# Patient Record
Sex: Male | Born: 1955 | Race: Black or African American | Hispanic: No | Marital: Single | Smoking: Former smoker
Health system: Southern US, Community
[De-identification: ages and names within clinical notes are randomized; demographics above are authoritative.]

## PROBLEM LIST (undated history)

## (undated) DIAGNOSIS — C801 Malignant (primary) neoplasm, unspecified: Secondary | ICD-10-CM

---

## 2020-02-09 ENCOUNTER — Encounter: Payer: Self-pay | Admitting: Emergency Medicine

## 2020-02-09 ENCOUNTER — Other Ambulatory Visit: Payer: Self-pay

## 2020-02-09 ENCOUNTER — Ambulatory Visit
Admission: EM | Admit: 2020-02-09 | Discharge: 2020-02-09 | Disposition: A | Discharge status: Home / Self Care | Payer: Medicaid Other | Attending: Emergency Medicine | Admitting: Emergency Medicine

## 2020-02-09 DIAGNOSIS — R634 Abnormal weight loss: Secondary | ICD-10-CM | POA: Insufficient documentation

## 2020-02-09 DIAGNOSIS — R05 Cough: Secondary | ICD-10-CM | POA: Diagnosis not present

## 2020-02-09 DIAGNOSIS — Z6824 Body mass index (BMI) 24.0-24.9, adult: Secondary | ICD-10-CM | POA: Diagnosis not present

## 2020-02-09 DIAGNOSIS — R1310 Dysphagia, unspecified: Secondary | ICD-10-CM

## 2020-02-09 DIAGNOSIS — F17210 Nicotine dependence, cigarettes, uncomplicated: Secondary | ICD-10-CM | POA: Diagnosis not present

## 2020-02-09 DIAGNOSIS — R1319 Other dysphagia: Secondary | ICD-10-CM | POA: Insufficient documentation

## 2020-02-09 DIAGNOSIS — T719XXA Asphyxiation due to unspecified cause, initial encounter: Secondary | ICD-10-CM | POA: Diagnosis not present

## 2020-02-09 DIAGNOSIS — R0600 Dyspnea, unspecified: Secondary | ICD-10-CM | POA: Insufficient documentation

## 2020-02-09 DIAGNOSIS — Z801 Family history of malignant neoplasm of trachea, bronchus and lung: Secondary | ICD-10-CM | POA: Diagnosis not present

## 2020-02-09 DIAGNOSIS — Z20822 Contact with and (suspected) exposure to covid-19: Secondary | ICD-10-CM | POA: Insufficient documentation

## 2020-02-09 NOTE — Discharge Instructions (Addendum)
I have placed an urgent referral to gastroenterology.  I think that they are going to be your next best step to evaluate and treat you.  You can try a pured diet, smoothies until you are seen by GI.  You can also try contacting them to schedule a close follow-up however somebody should be contacting you in the next day.
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
## 2020-02-10 LAB — SARS CORONAVIRUS 2 (TAT 6-24 HRS): SARS Coronavirus 2: NEGATIVE

## 2020-02-15 ENCOUNTER — Encounter: Payer: Self-pay | Admitting: Gastroenterology

## 2020-02-15 ENCOUNTER — Other Ambulatory Visit: Payer: Self-pay

## 2020-02-15 ENCOUNTER — Ambulatory Visit (INDEPENDENT_AMBULATORY_CARE_PROVIDER_SITE_OTHER): Payer: Self-pay | Admitting: Gastroenterology

## 2020-02-15 VITALS — BP 99/68 | HR 96 | Temp 98.5°F | Ht 61.0 in | Wt 103.5 lb

## 2020-02-15 DIAGNOSIS — R1310 Dysphagia, unspecified: Secondary | ICD-10-CM

## 2020-02-15 DIAGNOSIS — R499 Unspecified voice and resonance disorder: Secondary | ICD-10-CM

## 2020-02-15 DIAGNOSIS — R490 Dysphonia: Secondary | ICD-10-CM

## 2020-02-15 DIAGNOSIS — R634 Abnormal weight loss: Secondary | ICD-10-CM

## 2020-02-15 NOTE — Patient Instructions (Addendum)
[facility] at 8:30am on 02/25/2020 for the Barium Xray. Nothing to eat or drink for 3 hours before the scan
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
## 2020-02-16 LAB — COMPREHENSIVE METABOLIC PANEL
ALT: 10 IU/L (ref 0–44)
AST: 13 IU/L (ref 0–40)
Albumin/Globulin Ratio: 1.3 (ref 1.2–2.2)
Albumin: 3.8 g/dL (ref 3.8–4.8)
Alkaline Phosphatase: 129 IU/L — ABNORMAL HIGH (ref 44–121)
BUN/Creatinine Ratio: 7 — ABNORMAL LOW (ref 10–24)
BUN: 5 mg/dL — ABNORMAL LOW (ref 8–27)
Bilirubin Total: <0.2 mg/dL (ref 0.0–1.2)
CO2: 23 mmol/L (ref 20–29)
Calcium: 9.3 mg/dL (ref 8.6–10.2)
Chloride: 105 mmol/L (ref 96–106)
Creatinine, Ser: 0.69 mg/dL — ABNORMAL LOW (ref 0.76–1.27)
GFR calc Af Amer: 117 mL/min/{1.73_m2} (ref >59)
GFR calc non Af Amer: 101 mL/min/{1.73_m2} (ref >59)
Globulin, Total: 3 g/dL (ref 1.5–4.5)
Glucose: 79 mg/dL (ref 65–99)
Potassium: 4.6 mmol/L (ref 3.5–5.2)
Sodium: 140 mmol/L (ref 134–144)
Total Protein: 6.8 g/dL (ref 6.0–8.5)

## 2020-02-16 LAB — CBC
Hematocrit: 33.5 % — ABNORMAL LOW (ref 37.5–51.0)
Hemoglobin: 10.6 g/dL — ABNORMAL LOW (ref 13.0–17.7)
MCH: 24.6 pg — ABNORMAL LOW (ref 26.6–33.0)
MCHC: 31.6 g/dL (ref 31.5–35.7)
MCV: 78 fL — ABNORMAL LOW (ref 79–97)
Platelets: 611 10*3/uL — ABNORMAL HIGH (ref 150–450)
RBC: 4.31 x10E6/uL (ref 4.14–5.80)
RDW: 16.4 % — ABNORMAL HIGH (ref 11.6–15.4)
WBC: 13.9 10*3/uL — ABNORMAL HIGH (ref 3.4–10.8)

## 2020-02-18 ENCOUNTER — Other Ambulatory Visit: Payer: Self-pay

## 2020-02-18 ENCOUNTER — Ambulatory Visit
Admission: RE | Admit: 2020-02-18 | Discharge: 2020-02-18 | Disposition: A | Discharge status: Home / Self Care | Payer: Medicaid Other | Source: Ambulatory Visit | Attending: Gastroenterology | Admitting: Gastroenterology

## 2020-02-18 ENCOUNTER — Telehealth: Payer: Self-pay

## 2020-02-18 ENCOUNTER — Telehealth: Payer: Self-pay | Admitting: Gastroenterology

## 2020-02-18 DIAGNOSIS — R1310 Dysphagia, unspecified: Secondary | ICD-10-CM | POA: Diagnosis not present

## 2020-02-18 NOTE — Telephone Encounter (Signed)
Called and left a message for call back. What is the diagnosis for EGD

## 2020-02-18 NOTE — Telephone Encounter (Signed)
EGD for dysphagia  RV

## 2020-02-18 NOTE — Telephone Encounter (Signed)
LVM for patient to call our office to schedule an f/u appt per [facility]

## 2020-02-18 NOTE — Telephone Encounter (Signed)
-----   Message from Lin Landsman, MD sent at 02/17/2020  3:51 PM EDT ----- Regarding: Follow-up appointment I just want to make sure this patient has follow-up appointment with me in 3 to 4 weeks, okay to overbookThanks RV

## 2020-02-18 NOTE — Telephone Encounter (Signed)
Called patient brother and he states he will tell the patient to call us back. Could not give him any information because there is no DPR form signed

## 2020-02-18 NOTE — Telephone Encounter (Signed)
Please schedule egd  Thanks  RV

## 2020-02-18 NOTE — Telephone Encounter (Signed)
Patient had his dg esophagus w single cm today. [name] radiology called with the STAT result: An approximately 5 cm long irregular prominent stricture with prominent ulcerations noted in the distal thoracic esophagus. This is very suspicious for esophageal malignancy. Endoscopic evaluation is suggested.

## 2020-02-19 NOTE — Telephone Encounter (Signed)
Left a message for call back with brother.

## 2020-02-22 ENCOUNTER — Other Ambulatory Visit: Payer: Self-pay

## 2020-02-22 DIAGNOSIS — R1310 Dysphagia, unspecified: Secondary | ICD-10-CM

## 2020-02-22 NOTE — Telephone Encounter (Signed)
Paitent called back and scheduled EGD for 03/02/2020. Went over instructions and he verbalized understanding

## 2020-02-22 NOTE — Telephone Encounter (Signed)
Patient made follow up appointment

## 2020-02-29 ENCOUNTER — Other Ambulatory Visit: Payer: Medicaid Other

## 2020-03-01 ENCOUNTER — Other Ambulatory Visit: Payer: Self-pay

## 2020-03-01 ENCOUNTER — Other Ambulatory Visit
Admission: RE | Admit: 2020-03-01 | Discharge: 2020-03-01 | Disposition: A | Discharge status: Home / Self Care | Payer: HRSA Program | Source: Ambulatory Visit | Attending: Gastroenterology | Admitting: Gastroenterology

## 2020-03-01 DIAGNOSIS — Z20822 Contact with and (suspected) exposure to covid-19: Secondary | ICD-10-CM | POA: Diagnosis not present

## 2020-03-01 DIAGNOSIS — Z01812 Encounter for preprocedural laboratory examination: Secondary | ICD-10-CM | POA: Diagnosis present

## 2020-03-01 LAB — SARS CORONAVIRUS 2 (TAT 6-24 HRS): SARS Coronavirus 2: NEGATIVE

## 2020-03-02 ENCOUNTER — Ambulatory Visit
Admission: RE | Admit: 2020-03-02 | Discharge: 2020-03-02 | Disposition: A | Discharge status: Home / Self Care | Payer: Medicaid Other | Attending: Gastroenterology | Admitting: Gastroenterology

## 2020-03-02 ENCOUNTER — Ambulatory Visit: Payer: Medicaid Other | Admitting: Anesthesiology

## 2020-03-02 ENCOUNTER — Encounter
Admission: RE | Disposition: A | Discharge status: Home / Self Care | Payer: Self-pay | Source: Home / Self Care | Attending: Gastroenterology

## 2020-03-02 ENCOUNTER — Telehealth: Payer: Self-pay

## 2020-03-02 ENCOUNTER — Encounter: Payer: Self-pay | Admitting: Gastroenterology

## 2020-03-02 DIAGNOSIS — F17210 Nicotine dependence, cigarettes, uncomplicated: Secondary | ICD-10-CM | POA: Diagnosis not present

## 2020-03-02 DIAGNOSIS — C159 Malignant neoplasm of esophagus, unspecified: Secondary | ICD-10-CM

## 2020-03-02 DIAGNOSIS — R933 Abnormal findings on diagnostic imaging of other parts of digestive tract: Secondary | ICD-10-CM | POA: Insufficient documentation

## 2020-03-02 DIAGNOSIS — C155 Malignant neoplasm of lower third of esophagus: Secondary | ICD-10-CM | POA: Diagnosis not present

## 2020-03-02 DIAGNOSIS — R1310 Dysphagia, unspecified: Secondary | ICD-10-CM | POA: Insufficient documentation

## 2020-03-02 DIAGNOSIS — Z801 Family history of malignant neoplasm of trachea, bronchus and lung: Secondary | ICD-10-CM | POA: Insufficient documentation

## 2020-03-02 DIAGNOSIS — R1319 Other dysphagia: Secondary | ICD-10-CM

## 2020-03-02 HISTORY — PX: ESOPHAGOGASTRODUODENOSCOPY (EGD) WITH PROPOFOL: SHX5813

## 2020-03-02 SURGERY — ESOPHAGOGASTRODUODENOSCOPY (EGD) WITH PROPOFOL
Anesthesia: General

## 2020-03-02 MED ORDER — PROPOFOL 500 MG/50ML IV EMUL
INTRAVENOUS | Status: AC
  Filled 2020-03-02: qty 50

## 2020-03-02 MED ORDER — LIDOCAINE HCL (PF) 2 % IJ SOLN
INTRAMUSCULAR | Status: AC
  Filled 2020-03-02: qty 5

## 2020-03-02 MED ORDER — PHENYLEPHRINE HCL (PRESSORS) 10 MG/ML IV SOLN
INTRAVENOUS | Status: DC | PRN
  Administered 2020-03-02: 100 ug via INTRAVENOUS

## 2020-03-02 MED ORDER — PROPOFOL 500 MG/50ML IV EMUL
INTRAVENOUS | Status: DC | PRN
  Administered 2020-03-02: 150 ug/kg/min via INTRAVENOUS

## 2020-03-02 MED ORDER — LIDOCAINE HCL (CARDIAC) PF 100 MG/5ML IV SOSY
PREFILLED_SYRINGE | INTRAVENOUS | Status: DC | PRN
  Administered 2020-03-02: 100 mg via INTRATRACHEAL

## 2020-03-02 MED ORDER — PROPOFOL 10 MG/ML IV BOLUS
INTRAVENOUS | Status: DC | PRN
  Administered 2020-03-02 (×3): 20 mg via INTRAVENOUS
  Administered 2020-03-02: 40 mg via INTRAVENOUS

## 2020-03-02 MED ORDER — SODIUM CHLORIDE 0.9 % IV SOLN: INTRAVENOUS | Status: DC

## 2020-03-02 NOTE — Anesthesia Procedure Notes (Signed)
Date/Time: 03/02/2020 10:20 AM Performed by: Allean Found, CRNA Pre-anesthesia Checklist: Patient identified, Emergency Drugs available, Suction available, Patient being monitored and Timeout performed Patient Re-evaluated:Patient Re-evaluated prior to induction Oxygen Delivery Method: Nasal cannula and Circle system utilized Preoxygenation: Pre-oxygenation with 100% oxygen Placement Confirmation: positive ETCO2 Comments: Desaturated to 70's with nasal cannula O2, Applied Facemask and 100% O2

## 2020-03-02 NOTE — H&P (Signed)
Cephas Darby, MD [address]  [place]  [place], [place]  [address]  Main: [phone]  Fax: [phone] Pager: [phone]  Primary Care Physician:  Patient, No Pcp Per Primary Gastroenterologist:  Dr. Cephas Darby  Pre-Procedure History & Physical: HPI:  Andrew Pratt is a 64 y.o. male is here for an endoscopy.   History reviewed. No pertinent past medical history.  History reviewed. No pertinent surgical history.  Prior to Admission medications   Not on File    Allergies as of 02/22/2020  . (No Known Allergies)    Family History  Problem Relation Age of Onset  . Cancer Mother        lung    Social History   Socioeconomic History  . Marital status: Single    Spouse name: Not on file  . Number of children: Not on file  . Years of education: Not on file  . Highest education level: Not on file  Occupational History  . Not on file  Tobacco Use  . Smoking status: Current Every Day Smoker    Packs/day: 0.50    Types: Cigarettes  . Smokeless tobacco: Never Used  Vaping Use  . Vaping Use: Never used  Substance and Sexual Activity  . Alcohol use: Yes    Alcohol/week: 7.0 standard drinks    Types: 7 Cans of beer per week  . Drug use: Not Currently  . Sexual activity: Not on file  Other Topics Concern  . Not on file  Social History Narrative  . Not on file   Social Determinants of Health   Financial Resource Strain:   . Difficulty of Paying Living Expenses: Not on file  Food Insecurity:   . Worried About Charity fundraiser in the Last Year: Not on file  . Ran Out of Food in the Last Year: Not on file  Transportation Needs:   . Lack of Transportation (Medical): Not on file  . Lack of Transportation (Non-Medical): Not on file  Physical Activity:   . Days of Exercise per Week: Not on file  . Minutes of Exercise per Session: Not on file  Stress:   . Feeling of Stress : Not on file  Social Connections:   . Frequency of Communication with  Friends and Family: Not on file  . Frequency of Social Gatherings with Friends and Family: Not on file  . Attends Religious Services: Not on file  . Active Member of Clubs or Organizations: Not on file  . Attends Archivist Meetings: Not on file  . Marital Status: Not on file  Intimate Partner Violence:   . Fear of Current or Ex-Partner: Not on file  . Emotionally Abused: Not on file  . Physically Abused: Not on file  . Sexually Abused: Not on file    Review of Systems: See HPI, otherwise negative ROS  Physical Exam: BP 126/73   Pulse 75   Temp 97.7 F (36.5 C) (Temporal)   Resp 17   Ht 5\' 1"  (1.549 m)   Wt 44.9 kg   SpO2 100%   BMI 18.71 kg/m  General:   Alert,  pleasant and cooperative in NAD Head:  Normocephalic and atraumatic. Neck:  Supple; no masses or thyromegaly. Lungs:  Clear throughout to auscultation.    Heart:  Regular rate and rhythm. Abdomen:  Soft, nontender and nondistended. Normal bowel sounds, without guarding, and without rebound.   Neurologic:  Alert and  oriented x4;  grossly normal neurologically.  Impression/Plan: Andrew Pratt is here for an endoscopy to be performed for dysphagia  Risks, benefits, limitations, and alternatives regarding  endoscopy have been reviewed with the patient.  Questions have been answered.  All parties agreeable.   Sherri Sear, MD  03/02/2020, 8:46 AM

## 2020-03-02 NOTE — Anesthesia Postprocedure Evaluation (Signed)
Anesthesia Post Note  Patient: Andrew Pratt  Procedure(s) Performed: ESOPHAGOGASTRODUODENOSCOPY (EGD) WITH PROPOFOL (N/A )  Patient location during evaluation: Endoscopy Anesthesia Type: General Level of consciousness: awake and alert and oriented Pain management: pain level controlled Vital Signs Assessment: post-procedure vital signs reviewed and stable Respiratory status: spontaneous breathing, nonlabored ventilation and respiratory function stable Cardiovascular status: blood pressure returned to baseline and stable Postop Assessment: no signs of nausea or vomiting Anesthetic complications: no   No complications documented.   Last Vitals:  Vitals:   03/02/20 1120 03/02/20 1130  BP: (!) 141/87 137/86  Pulse: 61 60  Resp: (!) 30 (!) 24  Temp:    SpO2: 97% 97%    Last Pain:  Vitals:   03/02/20 0831  TempSrc: Temporal                 Zubair Lofton

## 2020-03-02 NOTE — Transfer of Care (Signed)
Immediate Anesthesia Transfer of Care Note  Patient: Andrew Pratt  Procedure(s) Performed: ESOPHAGOGASTRODUODENOSCOPY (EGD) WITH PROPOFOL (N/A )  Patient Location: PACU  Anesthesia Type:General  Level of Consciousness: awake, alert  and oriented  Airway & Oxygen Therapy: Patient Spontanous Breathing and Patient connected to nasal cannula oxygen  Post-op Assessment: Report given to RN and Post -op Vital signs reviewed and stable  Post vital signs: Reviewed and stable  Last Vitals:  Vitals Value Taken Time  BP 130/83 03/02/20 1057  Temp 36.6 C 03/02/20 1057  Pulse 69 03/02/20 1058  Resp 23 03/02/20 1059  SpO2 99 % 03/02/20 1058  Vitals shown include unvalidated device data.  Last Pain:  Vitals:   03/02/20 0831  TempSrc: Temporal         Complications: No complications documented.

## 2020-03-02 NOTE — Anesthesia Preprocedure Evaluation (Signed)
Anesthesia Evaluation  Patient identified by MRN, date of birth, ID band Patient awake    Reviewed: Allergy & Precautions, NPO status , Patient's Chart, lab work & pertinent test results  History of Anesthesia Complications Negative for: history of anesthetic complications  Airway Mallampati: II  TM Distance: >3 FB Neck ROM: Full    Dental  (+) Poor Dentition   Pulmonary neg sleep apnea, neg COPD, Current Smoker and Patient abstained from smoking.,    breath sounds clear to auscultation- rhonchi (-) wheezing      Cardiovascular (-) hypertension(-) CAD, (-) Past MI, (-) Cardiac Stents and (-) CABG  Rhythm:Regular Rate:Normal - Systolic murmurs and - Diastolic murmurs    Neuro/Psych neg Seizures negative neurological ROS  negative psych ROS   GI/Hepatic negative GI ROS, Neg liver ROS,   Endo/Other  negative endocrine ROSneg diabetes  Renal/GU negative Renal ROS     Musculoskeletal negative musculoskeletal ROS (+)   Abdominal (+) - obese,   Peds  Hematology negative hematology ROS (+)   Anesthesia Other Findings   Reproductive/Obstetrics                             Anesthesia Physical Anesthesia Plan  ASA: II  Anesthesia Plan: General   Post-op Pain Management:    Induction: Intravenous  PONV Risk Score and Plan: 0 and Propofol infusion  Airway Management Planned: Natural Airway  Additional Equipment:   Intra-op Plan:   Post-operative Plan:   Informed Consent: I have reviewed the patients History and Physical, chart, labs and discussed the procedure including the risks, benefits and alternatives for the proposed anesthesia with the patient or authorized representative who has indicated his/her understanding and acceptance.     Dental advisory given  Plan Discussed with: CRNA and Anesthesiologist  Anesthesia Plan Comments:         Anesthesia Quick Evaluation

## 2020-03-02 NOTE — Op Note (Signed)
[facility] Gastroenterology Patient Name: Andrew Pratt Procedure Date: 03/02/2020 10:13 AM MRN: [id number] Account #: [id number] Date of Birth: [birth year] Admit Type: Outpatient Age: 64 Room: [facility] ENDO ROOM 2 Gender: Male Note Status: Finalized Procedure:             Upper GI endoscopy Indications:           Esophageal dysphagia, Suspected tumor of the                         esophagus, Abnormal UGI series Providers:             Lin Landsman MD, MD Referring MD:          No Local Md, MD (Referring MD) Medicines:             Monitored Anesthesia Care Complications:         No immediate complications. Estimated blood loss:                         Minimal. Procedure:             Pre-Anesthesia Assessment:                        - Prior to the procedure, a History and Physical was                         performed, and patient medications and allergies were                         reviewed. The patient is competent. The risks and                         benefits of the procedure and the sedation options and                         risks were discussed with the patient. All questions                         were answered and informed consent was obtained.                         Patient identification and proposed procedure were                         verified by the physician, the nurse, the                         anesthesiologist, the anesthetist and the technician                         in the pre-procedure area in the procedure room in the                         endoscopy suite. Mental Status Examination: alert and                         oriented. Airway Examination: normal oropharyngeal  airway and neck mobility. Respiratory Examination:                         clear to auscultation. CV Examination: normal.                         Prophylactic Antibiotics: The patient does not require                         prophylactic  antibiotics. Prior Anticoagulants: The                         patient has taken no previous anticoagulant or                         antiplatelet agents. ASA Grade Assessment: III - A                         patient with severe systemic disease. After reviewing                         the risks and benefits, the patient was deemed in                         satisfactory condition to undergo the procedure. The                         anesthesia plan was to use monitored anesthesia care                         (MAC). Immediately prior to administration of                         medications, the patient was re-assessed for adequacy                         to receive sedatives. The heart rate, respiratory                         rate, oxygen saturations, blood pressure, adequacy of                         pulmonary ventilation, and response to care were                         monitored throughout the procedure. The physical                         status of the patient was re-assessed after the                         procedure.                        After obtaining informed consent, the endoscope was                         passed under direct vision. Throughout the procedure,  the patient's blood pressure, pulse, and oxygen                         saturations were monitored continuously. The Endoscope                         was introduced through the mouth, and advanced to the                         lower third of esophagus. The upper GI endoscopy was                         unusually difficult due to a partially obstructing                         mass. The patient tolerated the procedure well. Findings:      A large, fungating and ulcerating mass with bleeding and stigmata of       recent bleeding was found in the distal esophagus, 30 cm from the       incisors extending to 26cm. The mass was partially obstructing and       circumferential. Biopsies were taken  with a cold forceps for histology.       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 Biopsied. Recommendation:        - Await pathology results.                        - Refer to an oncologist at appointment to be                         scheduled.                        - Full liquid diet, mechanical soft diet and pureed                         diet. 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 All rights reserved. The codes documented in this report are preliminary and upon coder review may  be revised to meet current compliance requirements. Dr. Ulyess Mort Lin Landsman MD, MD 03/02/2020 10:38:28 AM This report has been signed electronically. Number of Addenda: 0 Note Initiated On: 03/02/2020 10:13 AM Estimated Blood Loss:  Estimated blood loss was minimal.      [facility]

## 2020-03-02 NOTE — Telephone Encounter (Signed)
-----   Message from Lin Landsman, MD sent at 03/02/2020 11:00 AM EDT ----- Regarding: Referral to oncology Refer him to Dr Janese Banks or Dr Tasia Catchings Dx: Esophageal cancer  RV

## 2020-03-02 NOTE — Telephone Encounter (Signed)
Sent referral to oncology with dr. Janese Banks

## 2020-03-03 ENCOUNTER — Encounter: Payer: Self-pay | Admitting: Gastroenterology

## 2020-03-03 LAB — SURGICAL PATHOLOGY

## 2020-03-04 ENCOUNTER — Other Ambulatory Visit: Payer: Self-pay | Admitting: *Deleted

## 2020-03-04 DIAGNOSIS — C155 Malignant neoplasm of lower third of esophagus: Secondary | ICD-10-CM

## 2020-03-05 ENCOUNTER — Inpatient Hospital Stay
Admission: EM | Admit: 2020-03-05 | Discharge: 2020-03-12 | DRG: 356 | Disposition: A | Discharge status: Home / Self Care | Payer: Medicaid Other | Attending: Internal Medicine | Admitting: Internal Medicine

## 2020-03-05 ENCOUNTER — Other Ambulatory Visit: Payer: Self-pay

## 2020-03-05 ENCOUNTER — Encounter: Payer: Self-pay | Admitting: Emergency Medicine

## 2020-03-05 DIAGNOSIS — D649 Anemia, unspecified: Secondary | ICD-10-CM

## 2020-03-05 DIAGNOSIS — Z20822 Contact with and (suspected) exposure to covid-19: Secondary | ICD-10-CM | POA: Diagnosis present

## 2020-03-05 DIAGNOSIS — E876 Hypokalemia: Secondary | ICD-10-CM | POA: Diagnosis present

## 2020-03-05 DIAGNOSIS — J690 Pneumonitis due to inhalation of food and vomit: Secondary | ICD-10-CM

## 2020-03-05 DIAGNOSIS — C155 Malignant neoplasm of lower third of esophagus: Secondary | ICD-10-CM | POA: Diagnosis present

## 2020-03-05 DIAGNOSIS — Z7189 Other specified counseling: Secondary | ICD-10-CM

## 2020-03-05 DIAGNOSIS — R1310 Dysphagia, unspecified: Secondary | ICD-10-CM

## 2020-03-05 DIAGNOSIS — Z720 Tobacco use: Secondary | ICD-10-CM | POA: Diagnosis present

## 2020-03-05 DIAGNOSIS — K222 Esophageal obstruction: Principal | ICD-10-CM

## 2020-03-05 DIAGNOSIS — E43 Unspecified severe protein-calorie malnutrition: Secondary | ICD-10-CM | POA: Insufficient documentation

## 2020-03-05 DIAGNOSIS — F17210 Nicotine dependence, cigarettes, uncomplicated: Secondary | ICD-10-CM | POA: Diagnosis present

## 2020-03-05 DIAGNOSIS — Z23 Encounter for immunization: Secondary | ICD-10-CM

## 2020-03-05 DIAGNOSIS — K2289 Other specified disease of esophagus: Secondary | ICD-10-CM

## 2020-03-05 DIAGNOSIS — Z681 Body mass index (BMI) 19 or less, adult: Secondary | ICD-10-CM

## 2020-03-05 DIAGNOSIS — Z801 Family history of malignant neoplasm of trachea, bronchus and lung: Secondary | ICD-10-CM

## 2020-03-05 DIAGNOSIS — D72829 Elevated white blood cell count, unspecified: Secondary | ICD-10-CM | POA: Diagnosis present

## 2020-03-05 DIAGNOSIS — C159 Malignant neoplasm of esophagus, unspecified: Secondary | ICD-10-CM

## 2020-03-05 DIAGNOSIS — Z95828 Presence of other vascular implants and grafts: Secondary | ICD-10-CM

## 2020-03-05 DIAGNOSIS — R531 Weakness: Secondary | ICD-10-CM

## 2020-03-05 DIAGNOSIS — C770 Secondary and unspecified malignant neoplasm of lymph nodes of head, face and neck: Secondary | ICD-10-CM | POA: Diagnosis present

## 2020-03-05 HISTORY — DX: Malignant (primary) neoplasm, unspecified: C80.1

## 2020-03-05 LAB — CBC WITH DIFFERENTIAL/PLATELET
Abs Immature Granulocytes: 0.12 10*3/uL — ABNORMAL HIGH (ref 0.00–0.07)
Basophils Absolute: 0 10*3/uL (ref 0.0–0.1)
Basophils Relative: 0 %
Eosinophils Absolute: 0.1 10*3/uL (ref 0.0–0.5)
Eosinophils Relative: 1 %
HCT: 37.3 % — ABNORMAL LOW (ref 39.0–52.0)
Hemoglobin: 11.3 g/dL — ABNORMAL LOW (ref 13.0–17.0)
Immature Granulocytes: 1 %
Lymphocytes Relative: 17 %
Lymphs Abs: 2 10*3/uL (ref 0.7–4.0)
MCH: 24.6 pg — ABNORMAL LOW (ref 26.0–34.0)
MCHC: 30.3 g/dL (ref 30.0–36.0)
MCV: 81.3 fL (ref 80.0–100.0)
Monocytes Absolute: 1.2 10*3/uL — ABNORMAL HIGH (ref 0.1–1.0)
Monocytes Relative: 10 %
Neutro Abs: 8.4 10*3/uL — ABNORMAL HIGH (ref 1.7–7.7)
Neutrophils Relative %: 71 %
Platelets: 568 10*3/uL — ABNORMAL HIGH (ref 150–400)
RBC: 4.59 MIL/uL (ref 4.22–5.81)
RDW: 18.8 % — ABNORMAL HIGH (ref 11.5–15.5)
WBC: 11.8 10*3/uL — ABNORMAL HIGH (ref 4.0–10.5)
nRBC: 0 % (ref 0.0–0.2)

## 2020-03-05 LAB — COMPREHENSIVE METABOLIC PANEL
ALT: 9 U/L (ref 0–44)
AST: 14 U/L — ABNORMAL LOW (ref 15–41)
Albumin: 3.6 g/dL (ref 3.5–5.0)
Alkaline Phosphatase: 94 U/L (ref 38–126)
Anion gap: 10 (ref 5–15)
BUN: 10 mg/dL (ref 8–23)
CO2: 25 mmol/L (ref 22–32)
Calcium: 9.6 mg/dL (ref 8.9–10.3)
Chloride: 108 mmol/L (ref 98–111)
Creatinine, Ser: 0.74 mg/dL (ref 0.61–1.24)
GFR, Estimated: >60 mL/min (ref >60)
Glucose, Bld: 99 mg/dL (ref 70–99)
Potassium: 3.3 mmol/L — ABNORMAL LOW (ref 3.5–5.1)
Sodium: 143 mmol/L (ref 135–145)
Total Bilirubin: 0.7 mg/dL (ref 0.3–1.2)
Total Protein: 8.3 g/dL — ABNORMAL HIGH (ref 6.5–8.1)

## 2020-03-05 LAB — RESPIRATORY PANEL BY RT PCR (FLU A&B, COVID)
Influenza A by PCR: NEGATIVE
Influenza B by PCR: NEGATIVE
SARS Coronavirus 2 by RT PCR: NEGATIVE

## 2020-03-05 MED ORDER — NICOTINE 21 MG/24HR TD PT24
21.0000 mg | MEDICATED_PATCH | Freq: Every day | TRANSDERMAL | Status: DC
  Administered 2020-03-06 – 2020-03-12 (×7): 21 mg via TRANSDERMAL
  Filled 2020-03-05 (×7): qty 1

## 2020-03-05 MED ORDER — SODIUM CHLORIDE 0.9 % IV BOLUS
1000.0000 mL | Freq: Once | INTRAVENOUS | Status: AC
  Administered 2020-03-05: 1000 mL via INTRAVENOUS

## 2020-03-05 MED ORDER — MORPHINE SULFATE (PF) 2 MG/ML IV SOLN
1.0000 mg | INTRAVENOUS | Status: DC | PRN
  Administered 2020-03-07: 1 mg via INTRAVENOUS
  Filled 2020-03-05: qty 1

## 2020-03-05 MED ORDER — DEXTROSE-NACL 5-0.9 % IV SOLN: INTRAVENOUS | Status: DC

## 2020-03-05 MED ORDER — SODIUM CHLORIDE 0.9 % IV SOLN: INTRAVENOUS | Status: DC

## 2020-03-05 MED ORDER — SODIUM CHLORIDE 0.9 % IV BOLUS: 1000.0000 mL | Freq: Once | INTRAVENOUS | Status: DC

## 2020-03-05 MED ORDER — POTASSIUM CHLORIDE 10 MEQ/100ML IV SOLN
10.0000 meq | INTRAVENOUS | Status: AC
  Administered 2020-03-05 (×2): 10 meq via INTRAVENOUS
  Filled 2020-03-05 (×2): qty 100

## 2020-03-05 MED ORDER — ONDANSETRON HCL 4 MG/2ML IJ SOLN
4.0000 mg | Freq: Three times a day (TID) | INTRAMUSCULAR | Status: DC | PRN
  Administered 2020-03-05 – 2020-03-11 (×2): 4 mg via INTRAVENOUS
  Filled 2020-03-05 (×2): qty 2

## 2020-03-05 NOTE — ED Triage Notes (Signed)
Pt to ED via POV c/o sore throat. Pt states that he has cancer in his throat. He is supposed to start chemo on Monday. Pt states that he is unable to swallow food due to the pain. Pt had upper GI on Monday, states that he has not been able to keep anything down since then. Pt states that if he tries to drink water it comes right back up. Pt is in NAD.

## 2020-03-05 NOTE — H&P (Signed)
History and Physical    Andrew Pratt:[id number] DOB: [birth year] DOA: 03/05/2020  Referring MD/NP/PA:   PCP: Patient, No Pcp Per   Patient coming from:  The patient is coming from home.  At baseline, pt is independent for most of ADL.        Chief Complaint: Difficulty swallowing  HPI: Andrew Pratt is a 64 y.o. male with medical history significant of tobacco abuse, recently diagnosed esophageal cancer, who presents with difficulty swallowing.  Patient states that he has been having difficulty swallowing for almost 1/2-year. Pt had EGD by Dr. Marius Ditch on 10/6. EGD showed partially obstructing and malignant esophageal tumor in the distal esophagus. 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  Patient was already given referral to oncologist.  Pt state that after the procedure, he has not been able to swallow or eat food.  He has difficulty swallowing and sore throat. He has been spitting up everything he eats and drinks since that time. No nausea, vomiting, diarrhea or abdominal pain.  No chest pain or shortness breath.  Patient has mild dry cough.  No fever or chills.  No symptoms of UTI or unilateral weakness.   ED Course: pt was found to have WBC 11.8, pending COVID-19 PCR, potassium 3.3, renal function okay, temperature normal, blood pressure 106/89, heart rate 96, RR 18, oxygen saturation 99% on room air.  Patient is placed on MedSurg bed for observation.  Dr. [name] of GI is consulted.  Review of Systems:   General: no fevers, chills, has poor appetite, has fatigue HEENT: no blurry vision, hearing changes or sore throat Respiratory: no dyspnea, coughing, wheezing CV: no chest pain, no palpitations GI: no nausea, vomiting, abdominal pain, diarrhea, constipation.  Has difficulty swallowing GU: no dysuria, burning on urination, increased urinary frequency, hematuria  Ext: no leg edema Neuro: no unilateral weakness, numbness, or tingling, no vision change or hearing  loss Skin: no rash, no skin tear. MSK: No muscle spasm, no deformity, no limitation of range of movement in spin Heme: No easy bruising.  Travel history: No recent long distant travel.  Allergy: No Known Allergies  Past Medical History:  Diagnosis Date  . Cancer [facility])     Past Surgical History:  Procedure Laterality Date  . ESOPHAGOGASTRODUODENOSCOPY (EGD) WITH PROPOFOL N/A 03/02/2020   Procedure: ESOPHAGOGASTRODUODENOSCOPY (EGD) WITH PROPOFOL;  Surgeon: Lin Landsman, MD;  Location: [place];  Service: Gastroenterology;  Laterality: N/A;    Social History:  reports that he has been smoking cigarettes. He has been smoking about 0.50 packs per day. He has never used smokeless tobacco. He reports current alcohol use of about 7.0 standard drinks of alcohol per week. He reports previous drug use.  Family History:  Family History  Problem Relation Age of Onset  . Cancer Mother        lung     Prior to Admission medications   Not on File    Physical Exam: Vitals:   03/05/20 0904 03/05/20 0905  BP: 106/89   Pulse: 96   Resp: 18   Temp: 98.8 F (37.1 C)   SpO2: 99%   Weight:  44.9 kg  Height:  5\' 1"  (1.549 m)   General: Not in acute distress HEENT:       Eyes: PERRL, EOMI, no scleral icterus.       ENT: No discharge from the ears and nose, no pharynx injection, no tonsillar enlargement.        Neck: No JVD,  no bruit, no mass felt. Heme: No neck lymph node enlargement. Cardiac: S1/S2, RRR, No murmurs, No gallops or rubs. Respiratory: No rales, wheezing, rhonchi or rubs. GI: Soft, nondistended, nontender, no rebound pain, no organomegaly, BS present. GU: No hematuria Ext: No pitting leg edema bilaterally. 2+DP/PT pulse bilaterally. Musculoskeletal: No joint deformities, No joint redness or warmth, no limitation of ROM in spin. Skin: No rashes.  Neuro: Alert, oriented X3, cranial nerves II-XII grossly intact, moves all extremities normally. Psych: Patient is  not psychotic, no suicidal or hemocidal ideation.  Labs on Admission: I have personally reviewed following labs and imaging studies  CBC: Recent Labs  Lab 03/05/20 0907  WBC 11.8*  NEUTROABS 8.4*  HGB 11.3*  HCT 37.3*  MCV 81.3  PLT 025*   Basic Metabolic Panel: Recent Labs  Lab 03/05/20 0907  NA 143  K 3.3*  CL 108  CO2 25  GLUCOSE 99  BUN 10  CREATININE 0.74  CALCIUM 9.6   GFR: Estimated Creatinine Clearance: 60 mL/min (by C-G formula based on SCr of 0.74 mg/dL). Liver Function Tests: Recent Labs  Lab 03/05/20 0907  AST 14*  ALT 9  ALKPHOS 94  BILITOT 0.7  PROT 8.3*  ALBUMIN 3.6   No results for input(s): LIPASE, AMYLASE in the last 168 hours. No results for input(s): AMMONIA in the last 168 hours. Coagulation Profile: No results for input(s): INR, PROTIME in the last 168 hours. Cardiac Enzymes: No results for input(s): CKTOTAL, CKMB, CKMBINDEX, TROPONINI in the last 168 hours. BNP (last 3 results) No results for input(s): PROBNP in the last 8760 hours. HbA1C: No results for input(s): HGBA1C in the last 72 hours. CBG: No results for input(s): GLUCAP in the last 168 hours. Lipid Profile: No results for input(s): CHOL, HDL, LDLCALC, TRIG, CHOLHDL, LDLDIRECT in the last 72 hours. Thyroid Function Tests: No results for input(s): TSH, T4TOTAL, FREET4, T3FREE, THYROIDAB in the last 72 hours. Anemia Panel: No results for input(s): VITAMINB12, FOLATE, FERRITIN, TIBC, IRON, RETICCTPCT in the last 72 hours. Urine analysis: No results found for: COLORURINE, APPEARANCEUR, LABSPEC, PHURINE, GLUCOSEU, HGBUR, BILIRUBINUR, KETONESUR, PROTEINUR, UROBILINOGEN, NITRITE, LEUKOCYTESUR Sepsis Labs: @LABRCNTIP (procalcitonin:4,lacticidven:4) ) Recent Results (from the past 240 hour(s))  SARS CORONAVIRUS 2 (TAT 6-24 HRS) Nasopharyngeal Nasopharyngeal Swab     Status: None   Collection Time: 03/01/20 11:37 AM   Specimen: Nasopharyngeal Swab  Result Value Ref Range Status     SARS Coronavirus 2 NEGATIVE NEGATIVE Final    Comment: (NOTE) SARS-CoV-2 target nucleic acids are NOT DETECTED.  The SARS-CoV-2 RNA is generally detectable in upper and lower respiratory specimens during the acute phase of infection. Negative results do not preclude SARS-CoV-2 infection, do not rule out co-infections with other pathogens, and should not be used as the sole basis for treatment or other patient management decisions. Negative results must be combined with clinical observations, patient history, and epidemiological information. The expected result is Negative.  Fact Sheet for Patients: SugarRoll.be  Fact Sheet for Healthcare Providers: https://www.woods-mathews.com/  This test is not yet approved or cleared by the [place] FDA and  has been authorized for detection and/or diagnosis of SARS-CoV-2 by FDA under an Emergency Use Authorization (EUA). This EUA will remain  in effect (meaning this test can be used) for the duration of the COVID-19 declaration under Se ction 564(b)(1) of the Act, 21 U.S.C. section 360bbb-3(b)(1), unless the authorization is terminated or revoked sooner.  Performed at [facility], [place] [address] [place], [place]  [address]   Respiratory Panel by RT PCR (Flu A&B, Covid) - Nasopharyngeal Swab     Status: None   Collection Time: 03/05/20 12:55 PM   Specimen: Nasopharyngeal Swab  Result Value Ref Range Status   SARS Coronavirus 2 by RT PCR NEGATIVE NEGATIVE Final    Comment: (NOTE) SARS-CoV-2 target nucleic acids are NOT DETECTED.  The SARS-CoV-2 RNA is generally detectable in upper respiratoy specimens during the acute phase of infection. The lowest concentration of SARS-CoV-2 viral copies this assay can detect is 131 copies/mL. A negative result does not preclude SARS-Cov-2 infection and should not be used as the sole basis for treatment or other patient management decisions. A  negative result may occur with  improper specimen collection/handling, submission of specimen other than nasopharyngeal swab, presence of viral mutation(s) within the areas targeted by this assay, and inadequate number of viral copies (<131 copies/mL). A negative result must be combined with clinical observations, patient history, and epidemiological information. The expected result is Negative.  Fact Sheet for Patients:  PinkCheek.be  Fact Sheet for Healthcare Providers:  GravelBags.it  This test is no t yet approved or cleared by the [place] FDA and  has been authorized for detection and/or diagnosis of SARS-CoV-2 by FDA under an Emergency Use Authorization (EUA). This EUA will remain  in effect (meaning this test can be used) for the duration of the COVID-19 declaration under Section 564(b)(1) of the Act, 21 U.S.C. section 360bbb-3(b)(1), unless the authorization is terminated or revoked sooner.     Influenza A by PCR NEGATIVE NEGATIVE Final   Influenza B by PCR NEGATIVE NEGATIVE Final    Comment: (NOTE) The Xpert Xpress SARS-CoV-2/FLU/RSV assay is intended as an aid in  the diagnosis of influenza from Nasopharyngeal swab specimens and  should not be used as a sole basis for treatment. Nasal washings and  aspirates are unacceptable for Xpert Xpress SARS-CoV-2/FLU/RSV  testing.  Fact Sheet for Patients: PinkCheek.be  Fact Sheet for Healthcare Providers: GravelBags.it  This test is not yet approved or cleared by the [place] FDA and  has been authorized for detection and/or diagnosis of SARS-CoV-2 by  FDA under an Emergency Use Authorization (EUA). This EUA will remain  in effect (meaning this test can be used) for the duration of the  Covid-19 declaration under Section 564(b)(1) of the Act, 21  U.S.C. section 360bbb-3(b)(1), unless the authorization  is  terminated or revoked. Performed at [facility], [address] [place], [place] [address]      Radiological Exams on Admission: No results found.   EKG:  Not done in ED, will get one.   Assessment/Plan Principal Problem:   Difficulty swallowing Active Problems:   Malignant neoplasm of lower third of esophagus (HCC)   Hypokalemia   Leukocytosis   Tobacco abuse   Difficulty swallowing due to malignant neoplasm of lower third of esophagus [facility]): Dr. [name] of GI is consulted.  Planning to repeat EGD tomorrow morning -will place on med-surg bed for obs -Keep patient n.p.o. -As needed Zofran -IV fluid: Normal saline 1 L bolus, then D5-NS at 75 cc/h -Patient was already given referral to oncology previously, need to follow-up with oncology  Hypokalemia: Potassium 3.3 -Replete potassium by IV -Check magnesium level  Leukocytosis: WBC 11.8, no fever, no source of infection identified, likely reactive -Follow-up with CBC  Tobacco abuse -Nicotine patch      DVT ppx: SQ Heparin     Code Status: Full code Family Communication: not  done, no family member is at bed side.   Disposition Plan:  Anticipate discharge back to previous environment Consults called:  Dr. [name] Admission status: Med-surg bed for obs  Status is: Observation  The patient remains OBS appropriate and will d/c before 2 midnights.  Dispo: The patient is from: Home              Anticipated d/c is to: Home              Anticipated d/c date is: 1 day              Patient currently is not medically stable to d/c.           Date of Service 03/05/2020    Ivor Costa [facility]   If 7PM-7AM, please contact night-coverage [email or web address] 03/05/2020, 2:11 PM

## 2020-03-05 NOTE — ED Provider Notes (Signed)
[facility] Emergency Department Provider Note   ____________________________________________   First MD Initiated Contact with Patient 03/05/20 1245     (approximate)  I have reviewed the triage vital signs and the nursing notes.   HISTORY  Chief Complaint Sore Throat    HPI Andrew Pratt is a 64 y.o. male with concerns for an esophageal mass  Reports difficulty with swallowing, this led to him having an endoscopy on the  sixth of this month  He was found to have a mass obstructing his esophagus.  He reports after the procedure he went home he has not been able to swallow or eat anything since then, whenever he eats or swallows anything it just comes back up.  His esophagus seems to be blocked  No recent illnesses otherwise.  No fevers or chills.  No chest pain no nausea vomiting.  He reports it will hurt just a little bit after he swallows then he has to spit it back up.  Has been spitting up everything he eats and drinks since that time  Past Medical History:  Diagnosis Date  . Cancer [facility])     Patient Active Problem List   Diagnosis Date Noted  . Malignant neoplasm of lower third of esophagus ([facility])   . Esophageal dysphagia     Past Surgical History:  Procedure Laterality Date  . ESOPHAGOGASTRODUODENOSCOPY (EGD) WITH PROPOFOL N/A 03/02/2020   Procedure: ESOPHAGOGASTRODUODENOSCOPY (EGD) WITH PROPOFOL;  Surgeon: Lin Landsman, MD;  Location: [place];  Service: Gastroenterology;  Laterality: N/A;    Prior to Admission medications   Not on File    Allergies Patient has no known allergies.  Family History  Problem Relation Age of Onset  . Cancer Mother        lung    Social History Social History   Tobacco Use  . Smoking status: Current Every Day Smoker    Packs/day: 0.50    Types: Cigarettes  . Smokeless tobacco: Never Used  Vaping Use  . Vaping Use: Never used  Substance Use Topics  . Alcohol use: Yes     Alcohol/week: 7.0 standard drinks    Types: 7 Cans of beer per week  . Drug use: Not Currently    Review of Systems Constitutional: No fever/chills Eyes: No visual changes. ENT: Little sore dryness in the throat, cannot seem to keep anything down Cardiovascular: Denies chest pain. Respiratory: Denies shortness of breath. Gastrointestinal: No abdominal pain.   Genitourinary: Negative for dysuria. Musculoskeletal: Negative for back pain. Neurological: Negative for headaches.    ____________________________________________   PHYSICAL EXAM:  VITAL SIGNS: ED Triage Vitals  Enc Vitals Group     BP 03/05/20 0904 106/89     Pulse Rate 03/05/20 0904 96     Resp 03/05/20 0904 18     Temp 03/05/20 0904 98.8 F (37.1 C)     Temp src --      SpO2 03/05/20 0904 99 %     Weight 03/05/20 0905 99 lb (44.9 kg)     Height 03/05/20 0905 5\' 1"  (1.549 m)     Head Circumference --      Peak Flow --      Pain Score 03/05/20 0904 10     Pain Loc --      Pain Edu? --      Excl. in [place]? --     Constitutional: Alert and oriented. Well appearing and in no acute distress. Eyes: Conjunctivae are normal.  Head: Atraumatic. Nose: No congestion/rhinnorhea. Mouth/Throat: Mucous membranes are moist.  Occasionally spitting up saliva into basin. Neck: No stridor.  Cardiovascular: Normal rate, regular rhythm. Good peripheral circulation. Respiratory: Normal respiratory effort.  No retractions.  Gastrointestinal: Soft and nontender. No distention. Musculoskeletal: No lower extremity tenderness nor edema. Neurologic:  Normal speech and language. No gross focal neurologic deficits are appreciated.  Skin:  Skin is warm, dry and intact. No rash noted. Psychiatric: Mood and affect are normal. Speech and behavior are normal.  ____________________________________________   LABS (all labs ordered are listed, but only abnormal results are displayed)  Labs Reviewed  CBC WITH DIFFERENTIAL/PLATELET -  Abnormal; Notable for the following components:      Result Value   WBC 11.8 (*)    Hemoglobin 11.3 (*)    HCT 37.3 (*)    MCH 24.6 (*)    RDW 18.8 (*)    Platelets 568 (*)    Neutro Abs 8.4 (*)    Monocytes Absolute 1.2 (*)    Abs Immature Granulocytes 0.12 (*)    All other components within normal limits  COMPREHENSIVE METABOLIC PANEL - Abnormal; Notable for the following components:   Potassium 3.3 (*)    Total Protein 8.3 (*)    AST 14 (*)    All other components within normal limits  RESPIRATORY PANEL BY RT PCR (FLU A&B, COVID)   ____________________________________________  EKG   ____________________________________________  RADIOLOGY   ____________________________________________   PROCEDURES  Procedure(s) performed: None  Procedures  Critical Care performed: No  ____________________________________________   INITIAL IMPRESSION / ASSESSMENT AND PLAN / ED COURSE  Pertinent labs & imaging results that were available during my care of the patient were reviewed by me and considered in my medical decision making (see chart for details).   Patient appears to have ongoing esophageal obstructive type symptoms.  He is spitting up.  No airway involvement.  Speaking clearly without any concerns for aspiration.  He is alert and oriented, but unable to keep anything down by mouth.  Patient spoke with Dr. [name] of GI while I was with him, and treatment plan developed to keep him n.p.o., IV fluids for hydration, and Dr. [name] will plan to do endoscopy for the patient tomorrow morning.  Patient is alert and oriented, his lab work reassuring with regards to his renal function with mild leukocytosis that is actually improved from previous and his hemoglobin also appears to be stable, I suspect he may be even just slightly hemoconcentrated  Discussed with the patient, patient agreeable with plan for admission.  Understanding that likely will have an endoscopy with Dr. [name] tomorrow  morning  Andrew Pratt was evaluated in Emergency Department on 03/05/2020 for the symptoms described in the history of present illness. He was evaluated in the context of the global COVID-19 pandemic, which necessitated consideration that the patient might be at risk for infection with the SARS-CoV-2 virus that causes COVID-19. Institutional protocols and algorithms that pertain to the evaluation of patients at risk for COVID-19 are in a state of rapid change based on information released by regulatory bodies including the CDC and federal and state organizations. These policies and algorithms were followed during the patient's care in the ED.     Admission discussed with Dr. Blaine Hamper.  Patient understand agreeable with plan for admission  ____________________________________________   FINAL CLINICAL IMPRESSION(S) / ED DIAGNOSES  Final diagnoses:  Esophageal obstruction        Note:  This document was  prepared using Systems analyst and may include unintentional dictation errors       Delman Kitten, MD 03/05/20 1412

## 2020-03-05 NOTE — ED Notes (Signed)
Pt presents to ED with c/o of having difficulty swallowing and increasing sore throat. Pt states recent EGD due to throat cancer. Pt states since EGD he has not been able to hold any liquid or food down. Pt presents with emesis bag full of pepsi. Pt states he has tried water also but cannot hold it down. Pt does have hoarse voice but denies SOB at this time. Pt is ambulatory and A&Ox4 at this time. No current distress noted.

## 2020-03-06 ENCOUNTER — Encounter: Payer: Self-pay | Admitting: Internal Medicine

## 2020-03-06 ENCOUNTER — Observation Stay: Payer: Medicaid Other

## 2020-03-06 ENCOUNTER — Observation Stay: Payer: Self-pay

## 2020-03-06 DIAGNOSIS — E876 Hypokalemia: Secondary | ICD-10-CM | POA: Diagnosis present

## 2020-03-06 DIAGNOSIS — C155 Malignant neoplasm of lower third of esophagus: Secondary | ICD-10-CM | POA: Diagnosis present

## 2020-03-06 DIAGNOSIS — Z23 Encounter for immunization: Secondary | ICD-10-CM | POA: Diagnosis not present

## 2020-03-06 DIAGNOSIS — D649 Anemia, unspecified: Secondary | ICD-10-CM | POA: Diagnosis present

## 2020-03-06 DIAGNOSIS — R1310 Dysphagia, unspecified: Secondary | ICD-10-CM | POA: Diagnosis present

## 2020-03-06 DIAGNOSIS — J690 Pneumonitis due to inhalation of food and vomit: Secondary | ICD-10-CM | POA: Diagnosis present

## 2020-03-06 DIAGNOSIS — K222 Esophageal obstruction: Secondary | ICD-10-CM | POA: Diagnosis present

## 2020-03-06 DIAGNOSIS — F17210 Nicotine dependence, cigarettes, uncomplicated: Secondary | ICD-10-CM | POA: Diagnosis present

## 2020-03-06 DIAGNOSIS — Z20822 Contact with and (suspected) exposure to covid-19: Secondary | ICD-10-CM | POA: Diagnosis present

## 2020-03-06 DIAGNOSIS — Z681 Body mass index (BMI) 19 or less, adult: Secondary | ICD-10-CM | POA: Diagnosis not present

## 2020-03-06 DIAGNOSIS — Z801 Family history of malignant neoplasm of trachea, bronchus and lung: Secondary | ICD-10-CM | POA: Diagnosis not present

## 2020-03-06 DIAGNOSIS — C770 Secondary and unspecified malignant neoplasm of lymph nodes of head, face and neck: Secondary | ICD-10-CM | POA: Diagnosis present

## 2020-03-06 DIAGNOSIS — E43 Unspecified severe protein-calorie malnutrition: Secondary | ICD-10-CM | POA: Diagnosis present

## 2020-03-06 LAB — PROTIME-INR
INR: 1.1 (ref 0.8–1.2)
Prothrombin Time: 14.2 seconds (ref 11.4–15.2)

## 2020-03-06 LAB — BASIC METABOLIC PANEL
Anion gap: 10 (ref 5–15)
BUN: 7 mg/dL — ABNORMAL LOW (ref 8–23)
CO2: 24 mmol/L (ref 22–32)
Calcium: 9.3 mg/dL (ref 8.9–10.3)
Chloride: 110 mmol/L (ref 98–111)
Creatinine, Ser: 0.76 mg/dL (ref 0.61–1.24)
GFR, Estimated: >60 mL/min (ref >60)
Glucose, Bld: 105 mg/dL — ABNORMAL HIGH (ref 70–99)
Potassium: 3.3 mmol/L — ABNORMAL LOW (ref 3.5–5.1)
Sodium: 144 mmol/L (ref 135–145)

## 2020-03-06 LAB — CBC
HCT: 32.5 % — ABNORMAL LOW (ref 39.0–52.0)
Hemoglobin: 10 g/dL — ABNORMAL LOW (ref 13.0–17.0)
MCH: 24.8 pg — ABNORMAL LOW (ref 26.0–34.0)
MCHC: 30.8 g/dL (ref 30.0–36.0)
MCV: 80.6 fL (ref 80.0–100.0)
Platelets: 472 10*3/uL — ABNORMAL HIGH (ref 150–400)
RBC: 4.03 MIL/uL — ABNORMAL LOW (ref 4.22–5.81)
RDW: 18.7 % — ABNORMAL HIGH (ref 11.5–15.5)
WBC: 18 10*3/uL — ABNORMAL HIGH (ref 4.0–10.5)
nRBC: 0 % (ref 0.0–0.2)

## 2020-03-06 LAB — APTT: aPTT: 35 seconds (ref 24–36)

## 2020-03-06 LAB — MAGNESIUM: Magnesium: 2.1 mg/dL (ref 1.7–2.4)

## 2020-03-06 MED ORDER — HEPARIN SODIUM (PORCINE) 5000 UNIT/ML IJ SOLN
5000.0000 [IU] | Freq: Three times a day (TID) | INTRAMUSCULAR | Status: DC
  Administered 2020-03-09: 5000 [IU] via SUBCUTANEOUS
  Filled 2020-03-06 (×5): qty 1

## 2020-03-06 MED ORDER — IOHEXOL 300 MG/ML  SOLN
80.0000 mL | Freq: Once | INTRAMUSCULAR | Status: AC | PRN
  Administered 2020-03-06: 80 mL via INTRAVENOUS

## 2020-03-06 MED ORDER — SODIUM CHLORIDE 0.9% FLUSH
10.0000 mL | Freq: Two times a day (BID) | INTRAVENOUS | Status: DC
  Administered 2020-03-06: 30 mL
  Administered 2020-03-07: 20 mL
  Administered 2020-03-07: 10 mL
  Administered 2020-03-08: 20 mL
  Administered 2020-03-09 – 2020-03-11 (×5): 10 mL
  Administered 2020-03-11: 30 mL
  Administered 2020-03-12: 10 mL

## 2020-03-06 MED ORDER — IOHEXOL 300 MG/ML  SOLN: 100.0000 mL | Freq: Once | INTRAMUSCULAR | Status: DC | PRN

## 2020-03-06 MED ORDER — ACETAMINOPHEN 325 MG PO TABS: 650.0000 mg | ORAL_TABLET | Freq: Four times a day (QID) | ORAL | Status: DC | PRN

## 2020-03-06 MED ORDER — ACETAMINOPHEN 650 MG RE SUPP
650.0000 mg | Freq: Four times a day (QID) | RECTAL | Status: DC | PRN
  Filled 2020-03-06: qty 1

## 2020-03-06 MED ORDER — SODIUM CHLORIDE 0.9% FLUSH: 10.0000 mL | INTRAVENOUS | Status: DC | PRN

## 2020-03-06 MED ORDER — CHLORHEXIDINE GLUCONATE CLOTH 2 % EX PADS
6.0000 | MEDICATED_PAD | Freq: Every day | CUTANEOUS | Status: DC
  Administered 2020-03-07 – 2020-03-12 (×5): 6 via TOPICAL

## 2020-03-06 MED ORDER — IOHEXOL 9 MG/ML PO SOLN: 500.0000 mL | ORAL | Status: AC

## 2020-03-06 MED ORDER — ACETAMINOPHEN 650 MG RE SUPP: 650.0000 mg | Freq: Four times a day (QID) | RECTAL | Status: DC | PRN

## 2020-03-06 NOTE — Progress Notes (Signed)
Peripherally Inserted Central Catheter Placement  The IV Nurse has discussed with the patient and/or persons authorized to consent for the patient, the purpose of this procedure and the potential benefits and risks involved with this procedure.  The benefits include less needle sticks, lab draws from the catheter, and the patient may be discharged home with the catheter. Risks include, but not limited to, infection, bleeding, blood clot (thrombus formation), and puncture of an artery; nerve damage and irregular heartbeat and possibility to perform a PICC exchange if needed/ordered by physician.  Alternatives to this procedure were also discussed.  Bard Power PICC patient education guide, fact sheet on infection prevention and patient information card has been provided to patient /or left at bedside.    PICC Placement Documentation  PICC Triple Lumen 03/06/20 PICC Right Brachial 34 cm 0 cm (Active)  Indication for Insertion or Continuance of Line Administration of hyperosmolar/irritating solutions (i.e. TPN, Vancomycin, etc.) 03/06/20 1444  Exposed Catheter (cm) 0 cm 03/06/20 1444  Site Assessment Clean;Dry;Intact 03/06/20 1444  Lumen #1 Status Flushed;Saline locked;Blood return noted 03/06/20 1444  Lumen #2 Status Flushed;Saline locked;Blood return noted 03/06/20 1444  Lumen #3 Status Saline locked;Flushed;Blood return noted 03/06/20 1444  Dressing Type Transparent;Securing device;Other (Comment) 03/06/20 1444  Dressing Status Clean;Dry;Intact 03/06/20 1444  Antimicrobial disc in place? Yes 03/06/20 1444  Safety Lock Not Applicable 39/76/73 4193  Line Care Connections checked and tightened 03/06/20 1444  Line Adjustment (NICU/IV Team Only) No 03/06/20 1444  Dressing Intervention New dressing 03/06/20 7902  Dressing Change Due 03/13/20 03/06/20 1444       Rolena Infante 03/06/2020, 2:56 PM

## 2020-03-06 NOTE — Progress Notes (Addendum)
Initial Nutrition Assessment  DOCUMENTATION CODES:   Not applicable (suspect PCM)  INTERVENTION:   If TPN still desired and patient obtains access:  Pharmacy to initiate Clinimix 5%AA/15%Dextrose at 45ml/hr with MVI and Trace Minerals at this time. Adjustments to be made by weekday RD.   Once J-tube placed:  -Osmolite 1.2 @ 20 ml/hr -Increase by 10 ml Q4 hours to goal rate of 55 ml/hr (1320 ml)  Provides: 1584 kcals, 73 grams protein, 1082 ml free water.   Monitor magnesium, potassium, and phosphorus daily for at least 3 days, MD to replete as needed, as pt is at risk for refeeding syndrome.   NUTRITION DIAGNOSIS:   Inadequate oral intake related to dysphagia as evidenced by per patient/family report.  GOAL:   Patient will meet greater than or equal to 90% of their needs  MONITOR:   PO intake, Supplement acceptance, Weight trends, Labs, I & O's, Diet advancement, TF tolerance  REASON FOR ASSESSMENT:   Consult New TPN/TNA  ASSESSMENT:   Patient with PMH significant for esophageal cancer. Presents this admission with difficulty swallowing.   RD contact per pharmacy via on call pager regarding possible initiation of TPN. Pt currently does not have access. Plan for pt o have J-tube placed in near future. If J-tube able to be placed and enteral nutrition able to start soon, may be able to hold off on TPN.   PTA pt has had difficulty swallowing for 6 months. He recently underwent EGD on 10/6 and has not been able to eat much since.   Records indicate pt weighed 46.9 kg on 9/20 and 44.9 kg this admission (4.2% wt loss in one month, significant for time frame). Attempted to reach pt but no answer. Suspect severe malnutrition in setting of chronic illness but unable to diagnose without NFPE.   Drips: D5 in NS @ 75 ml/hr  Labs: K 3.3 (L)   Diet Order:   Diet Order            Diet NPO time specified Except for: Ice Chips, Other (See Comments)  Diet effective now                  EDUCATION NEEDS:   Not appropriate for education at this time  Skin:  Skin Assessment: Reviewed RN Assessment  Last BM:  10/2  Height:   Ht Readings from Last 1 Encounters:  03/05/20 5\' 1"  (1.549 m)    Weight:   Wt Readings from Last 1 Encounters:  03/05/20 44.9 kg    BMI:  Body mass index is 18.71 kg/m.  Estimated Nutritional Needs:   Kcal:  1400-1600 kcal  Protein:  70-90 g  Fluid:  >/= 1.4 L/day   Mariana Single RD, LDN Clinical Nutrition Pager listed in [place]

## 2020-03-06 NOTE — Consult Note (Signed)
Andrew Pratt , MD [address], [place], [place], [place], [address] [address], [place], [place], [place], [address] Phone: [phone]  Fax: [phone]  Consultation  Referring Provider:   ER Primary Care Physician:  Patient, No Pcp Per Primary Gastroenterologist:  Dr. Marius Ditch          Reason for Consultation:     Dysphagia  Date of Admission:  03/05/2020 Date of Consultation:  03/06/2020         HPI:   Andrew Pratt is a 64 y.o. male Presented to the emergency room yesterday with difficulty swallowing.  He recently underwent upper endoscopy on 03/02/2020 by Dr. Marius Ditch for dysphagia and a large fungating ulcerated mass with bleeding was found in the distal esophagus.  The upper endoscope was not able to go past the mass due to obstruction.  Biopsies were taken.  I reviewed the images on epic and there is barely any lumen visible.  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  He is due to see Dr. Janese Banks as an outpatient in oncology.  Since the procedure he is unable to eat or swallow any food  No recent imaging to stage the cancer.  Hemoglobin 11.3 g He states that presently he cannot swallow anything.  He just returned from his CT scan of the chest abdomen pelvis and reports elevated.  Previously results of the endoscopy has not been discussed with him.  He has been told that it may be cancer.  Past Medical History:  Diagnosis Date  . Cancer [facility])     Past Surgical History:  Procedure Laterality Date  . ESOPHAGOGASTRODUODENOSCOPY (EGD) WITH PROPOFOL N/A 03/02/2020   Procedure: ESOPHAGOGASTRODUODENOSCOPY (EGD) WITH PROPOFOL;  Surgeon: Lin Landsman, MD;  Location: [facility];  Service: Gastroenterology;  Laterality: N/A;    Prior to Admission medications   Not on File    Family History  Problem Relation Age of Onset  . Cancer Mother        lung     Social History   Tobacco Use  . Smoking status: Current Every Day Smoker    Packs/day:  0.50    Types: Cigarettes  . Smokeless tobacco: Never Used  Vaping Use  . Vaping Use: Never used  Substance Use Topics  . Alcohol use: Yes    Alcohol/week: 7.0 standard drinks    Types: 7 Cans of beer per week  . Drug use: Not Currently    Allergies as of 03/05/2020  . (No Known Allergies)    Review of Systems:    All systems reviewed and negative except where noted in HPI.   Physical Exam:  Vital signs in last 24 hours: Temp:  [98 F (36.7 C)-98.8 F (37.1 C)] 98.7 F (37.1 C) (10/10 0422) Pulse Rate:  [57-96] 57 (10/10 0422) Resp:  [16-25] 16 (10/10 0422) BP: (106-141)/(71-89) 127/72 (10/10 0422) SpO2:  [98 %-100 %] 98 % (10/10 0422) Weight:  [44.9 kg] 44.9 kg (10/09 0905) Last BM Date: 02/27/20 General:   Pleasant, cooperative in NAD Head:  Normocephalic and atraumatic. Eyes:   No icterus.   Conjunctiva pink. PERRLA. Ears:  Normal auditory acuity. Neck:  Supple; no masses or thyroidomegaly Lungs: Respirations even and unlabored. Lungs clear to auscultation bilaterally.   No wheezes, crackles, or rhonchi.  Heart:  Regular rate and rhythm;  Without murmur, clicks, rubs or gallops Abdomen:  Soft, nondistended, nontender. Normal bowel sounds. No appreciable masses or hepatomegaly.  No rebound or guarding.  Neurologic:  Alert and oriented x3;  grossly normal neurologically. Skin:  Intact without significant lesions or rashes. Cervical Nodes:  No significant cervical adenopathy. Psych:  Alert and cooperative. Normal affect.  LAB RESULTS: Recent Labs    03/05/20 0907  WBC 11.8*  HGB 11.3*  HCT 37.3*  PLT 568*   BMET Recent Labs    03/05/20 0907  NA 143  K 3.3*  CL 108  CO2 25  GLUCOSE 99  BUN 10  CREATININE 0.74  CALCIUM 9.6   LFT Recent Labs    03/05/20 0907  PROT 8.3*  ALBUMIN 3.6  AST 14*  ALT 9  ALKPHOS 94  BILITOT 0.7   PT/INR No results for input(s): LABPROT, INR in the last 72 hours.  STUDIES: No results found.    Impression /  Plan:   Andrew Pratt is a 64 y.o. 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  The mass was in the distal part of the esophagus and almost leading to complete obstruction.  The upper endoscope during the procedure could not go past the obstruction at the lumen was very narrow.  Since the procedure he is unable to eat or drink anything.  I did discuss with Dr. Marius Ditch and she does agree that the mass was large enough that it is not surprising that he is unable to eat anything at this point of time.  I have discussed with Dr. Janese Banks agree with the recommendation that we should get a staging CT scan of the chest and abdomen.  If the CT scan shows metastatic disease then the goal of treatment would be palliative and hence a PEG tube would be appropriate for nutrition.  If the CT scan do not show any metastatic disease then he probably would be a surgical candidate and hence would need a J-tube at that point of time followed by esophagectomy.  If a J-tube is required can start him on TPN till we get the J-tube.  No role for endoscopy as it would not relieve any obstruction due to cancer.   Based on the CT scan results he is getting today can decide about TPN.  If no metastatic disease is seen please start him on TPN this evening.  Dr. Janese Banks from oncology is going to see him at 8 AM, the patient's family member said that she will be here at 8 AM to discuss with oncology as well along with the patient further course of action  I will sign off.  Please call me if any further GI concerns or questions.  We would like to thank you for the opportunity to participate in the care of Andrew Pratt.   Thank you for involving me in the care of this patient.      LOS: 0 days   Andrew Bellows, MD  03/06/2020, 8:52 AM

## 2020-03-06 NOTE — Progress Notes (Signed)
PROGRESS NOTE    Andrew Pratt   PJA:[id number]  DOB: 1955/06/23  PCP: Patient, No Pcp Per    DOA: 03/05/2020 LOS: 0   Brief Narrative   Andrew Pratt is a 64 y.o. male with medical history of tobacco abuse, recently diagnosed esophageal cancer, who presented to the ED on 03/05/20 with difficulty swallowing. This had been an ongoing issue for 1/2 year.  Pt underwent EGD on 10/6 with Dr. Marius Ditch which showed a partially obstructing mass in the distal esophagus.  Biopsy showed invasive squamous cell carcinoma, referred to oncology.  Since EGD, pt reports regurgitation of any PO intake including liquids.  In the ED, afebrile, HR 96, BP 106/89.  Labs showed mild leukocytosis 11.8k, Hbg 11.3 (up from 10.6 in Sept), hypokalemia K 3.3.    Admitted to hospitalist service with GI consulted.  Plan is for repeat EGD.     Assessment & Plan   Principal Problem:   Difficulty swallowing Active Problems:   Malignant neoplasm of lower third of esophagus (HCC)   Hypokalemia   Leukocytosis   Tobacco abuse   Dysphagia with esophageal obstruction due to squamous cell carcinoma - recent diagnosis of esophageal cancer after EGD on 10/6 as above.  GI (Dr. [name]) and Oncology (Dr. Janese Banks) are consulted.  Had not yet been seen by oncology after referral just a few days ago. --no utility in repeating EGD --PICC line and TPN ordered --Staging CT chest/abdomen/pelvis today --NPO ex ice chips / swish and spit liquids --Zofran PRN --IV hydration with D5-NS --Depending on staging results, will determine if surgical candidate and G vs J tube for enteral feeds  Hypokalemia - Potassium 3.3 on admission, replaced by IV.   --monitor BMP, replace as needed  Leukocytosis - WBC 11.8k on admission, with no fever, and no source of infection identified, likely reactive. --Monitor CBC and clinically for signs/symptoms of infection  Tobacco abuse - Nicotine patch   DVT prophylaxis: SCD's for now    Diet:   Diet Orders (From admission, onward)    Start     Ordered   03/05/20 1312  Diet NPO time specified  Diet effective now        03/05/20 1311            Code Status: Not on file    Subjective 03/06/20    Pt seen at bedside.  Reports unable to even swallow water, regurgitates his own saliva.  Has pain with talking very much.  No abdominal or chest pain.     Disposition Plan & Communication   Status is: Inpatient  Remains inpatient appropriate because:Inpatient level of care appropriate due to severity of illness.  No tolerance for oral intake, ongoing diagnostic evaluation.   Dispo: The patient is from: Home              Anticipated d/c is to: Home              Anticipated d/c date is: 2 days              Patient currently is not medically stable to d/c.   Family Communication: none at bedside, will attempt to call or see at bedside this afternoon    Consults, Procedures, Significant Events   Consultants:   Gastroenterology  Oncology  Procedures:   None  Antimicrobials:  Anti-infectives (From admission, onward)   None        Objective   Vitals:   03/05/20 1857 03/05/20 1953 03/05/20  2343 03/06/20 0422  BP: (!) 141/73 136/75 124/71 127/72  Pulse: 64 67 83 (!) 57  Resp: 20 17 20 16   Temp: 98 F (36.7 C) 98.7 F (37.1 C) 98.3 F (36.8 C) 98.7 F (37.1 C)  TempSrc: Oral Oral Oral Oral  SpO2: 100% 100% 99% 98%  Weight:      Height:        Intake/Output Summary (Last 24 hours) at 03/06/2020 0751 Last data filed at 03/06/2020 0400 Gross per 24 hour  Intake 0 ml  Output 450 ml  Net -450 ml   Filed Weights   03/05/20 0905  Weight: 44.9 kg    Physical Exam:  General exam: awake, alert, no acute distress, underweight Respiratory system: CTAB, no wheezes, rales or rhonchi, normal respiratory effort. Cardiovascular system: normal S1/S2, RRR, no pedal edema.   Gastrointestinal system: soft, NT, ND, +bowel sounds. Central nervous system: A&O x3.  no gross focal neurologic deficits, normal speech Psychiatry: normal mood, congruent affect, judgement and insight appear normal  Labs   Data Reviewed: I have personally reviewed following labs and imaging studies  CBC: Recent Labs  Lab 03/05/20 0907  WBC 11.8*  NEUTROABS 8.4*  HGB 11.3*  HCT 37.3*  MCV 81.3  PLT 027*   Basic Metabolic Panel: Recent Labs  Lab 03/05/20 0907 03/06/20 0521  NA 143  --   K 3.3*  --   CL 108  --   CO2 25  --   GLUCOSE 99  --   BUN 10  --   CREATININE 0.74  --   CALCIUM 9.6  --   MG  --  2.1   GFR: Estimated Creatinine Clearance: 60 mL/min (by C-G formula based on SCr of 0.74 mg/dL). Liver Function Tests: Recent Labs  Lab 03/05/20 0907  AST 14*  ALT 9  ALKPHOS 94  BILITOT 0.7  PROT 8.3*  ALBUMIN 3.6   No results for input(s): LIPASE, AMYLASE in the last 168 hours. No results for input(s): AMMONIA in the last 168 hours. Coagulation Profile: No results for input(s): INR, PROTIME in the last 168 hours. Cardiac Enzymes: No results for input(s): CKTOTAL, CKMB, CKMBINDEX, TROPONINI in the last 168 hours. BNP (last 3 results) No results for input(s): PROBNP in the last 8760 hours. HbA1C: No results for input(s): HGBA1C in the last 72 hours. CBG: No results for input(s): GLUCAP in the last 168 hours. Lipid Profile: No results for input(s): CHOL, HDL, LDLCALC, TRIG, CHOLHDL, LDLDIRECT in the last 72 hours. Thyroid Function Tests: No results for input(s): TSH, T4TOTAL, FREET4, T3FREE, THYROIDAB in the last 72 hours. Anemia Panel: No results for input(s): VITAMINB12, FOLATE, FERRITIN, TIBC, IRON, RETICCTPCT in the last 72 hours. Sepsis Labs: No results for input(s): PROCALCITON, LATICACIDVEN in the last 168 hours.  Recent Results (from the past 240 hour(s))  SARS CORONAVIRUS 2 (TAT 6-24 HRS) Nasopharyngeal Nasopharyngeal Swab     Status: None   Collection Time: 03/01/20 11:37 AM   Specimen: Nasopharyngeal Swab  Result Value Ref  Range Status   SARS Coronavirus 2 NEGATIVE NEGATIVE Final    Comment: (NOTE) SARS-CoV-2 target nucleic acids are NOT DETECTED.  The SARS-CoV-2 RNA is generally detectable in upper and lower respiratory specimens during the acute phase of infection. Negative results do not preclude SARS-CoV-2 infection, do not rule out co-infections with other pathogens, and should not be used as the sole basis for treatment or other patient management decisions. Negative results must be combined with clinical observations, patient  history, and epidemiological information. The expected result is Negative.  Fact Sheet for Patients: SugarRoll.be  Fact Sheet for Healthcare Providers: https://www.woods-mathews.com/  This test is not yet approved or cleared by the [place] FDA and  has been authorized for detection and/or diagnosis of SARS-CoV-2 by FDA under an Emergency Use Authorization (EUA). This EUA will remain  in effect (meaning this test can be used) for the duration of the COVID-19 declaration under Se ction 564(b)(1) of the Act, 21 U.S.C. section 360bbb-3(b)(1), unless the authorization is terminated or revoked sooner.  Performed at [facility], [place] [address], [place], [place] [address]   Respiratory Panel by RT PCR (Flu A&B, Covid) - Nasopharyngeal Swab     Status: None   Collection Time: 03/05/20 12:55 PM   Specimen: Nasopharyngeal Swab  Result Value Ref Range Status   SARS Coronavirus 2 by RT PCR NEGATIVE NEGATIVE Final    Comment: (NOTE) SARS-CoV-2 target nucleic acids are NOT DETECTED.  The SARS-CoV-2 RNA is generally detectable in upper respiratoy specimens during the acute phase of infection. The lowest concentration of SARS-CoV-2 viral copies this assay can detect is 131 copies/mL. A negative result does not preclude SARS-Cov-2 infection and should not be used as the sole basis for treatment or other patient management  decisions. A negative result may occur with  improper specimen collection/handling, submission of specimen other than nasopharyngeal swab, presence of viral mutation(s) within the areas targeted by this assay, and inadequate number of viral copies (<131 copies/mL). A negative result must be combined with clinical observations, patient history, and epidemiological information. The expected result is Negative.  Fact Sheet for Patients:  PinkCheek.be  Fact Sheet for Healthcare Providers:  GravelBags.it  This test is no t yet approved or cleared by the [place] FDA and  has been authorized for detection and/or diagnosis of SARS-CoV-2 by FDA under an Emergency Use Authorization (EUA). This EUA will remain  in effect (meaning this test can be used) for the duration of the COVID-19 declaration under Section 564(b)(1) of the Act, 21 U.S.C. section 360bbb-3(b)(1), unless the authorization is terminated or revoked sooner.     Influenza A by PCR NEGATIVE NEGATIVE Final   Influenza B by PCR NEGATIVE NEGATIVE Final    Comment: (NOTE) The Xpert Xpress SARS-CoV-2/FLU/RSV assay is intended as an aid in  the diagnosis of influenza from Nasopharyngeal swab specimens and  should not be used as a sole basis for treatment. Nasal washings and  aspirates are unacceptable for Xpert Xpress SARS-CoV-2/FLU/RSV  testing.  Fact Sheet for Patients: PinkCheek.be  Fact Sheet for Healthcare Providers: GravelBags.it  This test is not yet approved or cleared by the [place] FDA and  has been authorized for detection and/or diagnosis of SARS-CoV-2 by  FDA under an Emergency Use Authorization (EUA). This EUA will remain  in effect (meaning this test can be used) for the duration of the  Covid-19 declaration under Section 564(b)(1) of the Act, 21  U.S.C. section 360bbb-3(b)(1), unless the  authorization is  terminated or revoked. Performed at [name], [address] [place], [place] [address]       Imaging Studies   No results found.   Medications   Scheduled Meds: . nicotine  21 mg Transdermal Daily   Continuous Infusions: . dextrose 5 % and 0.9% NaCl 75 mL/hr at 03/06/20 0227       LOS: 0 days    Time spent: 30 minutes with > 50% spent in  coordination of care and direct patient contact.    Ezekiel Slocumb, DO [facility]  03/06/2020, 7:51 AM    If 7PM-7AM, please contact night-coverage. How to contact the [facility] Attending or Consulting provider [place] or covering provider during after hours [place], for this patient?    1. Check the care team in [facility] and look for a) attending/consulting [facility] provider listed and b) the [facility] team listed 2. Log into [email or web address] and use [place]'s universal password to access. If you do not have the password, please contact the hospital operator. 3. Locate the [facility] provider you are looking for under [facility] and page to a number that you can be directly reached. 4. If you still have difficulty reaching the provider, please page the [facility] (Director on Call) for the Hospitalists listed on amion for assistance.

## 2020-03-06 NOTE — Hospital Course (Addendum)
Andrew Pratt is a 64 y.o. male with medical history of tobacco abuse, recently diagnosed esophageal cancer, who presented to the ED on 03/05/20 with difficulty swallowing. This had been an ongoing issue for 1/2 year.  Pt underwent EGD on 10/6 with Dr. Marius Ditch which showed a partially obstructing mass in the distal esophagus.  Biopsy showed invasive squamous cell carcinoma, referred to oncology.  Since EGD, pt reports regurgitation of any PO intake including liquids.  In the ED, afebrile, HR 96, BP 106/89.  Labs showed mild leukocytosis 11.8k, Hbg 11.3 (up from 10.6 in Sept), hypokalemia K 3.3.    Admitted to hospitalist service with GI consulted.  Oncology consulted as well.  Plan is to start patient on TPN, await staging CT chest/abdomen/pelvis to determine if patient will be surgical candidate for resection.

## 2020-03-07 ENCOUNTER — Inpatient Hospital Stay: Payer: Medicaid Other | Admitting: Oncology

## 2020-03-07 ENCOUNTER — Inpatient Hospital Stay: Payer: Medicaid Other

## 2020-03-07 DIAGNOSIS — D649 Anemia, unspecified: Secondary | ICD-10-CM

## 2020-03-07 DIAGNOSIS — Z7189 Other specified counseling: Secondary | ICD-10-CM

## 2020-03-07 DIAGNOSIS — C159 Malignant neoplasm of esophagus, unspecified: Secondary | ICD-10-CM

## 2020-03-07 DIAGNOSIS — E43 Unspecified severe protein-calorie malnutrition: Secondary | ICD-10-CM | POA: Insufficient documentation

## 2020-03-07 DIAGNOSIS — C770 Secondary and unspecified malignant neoplasm of lymph nodes of head, face and neck: Secondary | ICD-10-CM

## 2020-03-07 DIAGNOSIS — R1319 Other dysphagia: Secondary | ICD-10-CM

## 2020-03-07 LAB — BASIC METABOLIC PANEL
Anion gap: 9 (ref 5–15)
BUN: 9 mg/dL (ref 8–23)
CO2: 25 mmol/L (ref 22–32)
Calcium: 9.4 mg/dL (ref 8.9–10.3)
Chloride: 110 mmol/L (ref 98–111)
Creatinine, Ser: 0.65 mg/dL (ref 0.61–1.24)
GFR, Estimated: >60 mL/min (ref >60)
Glucose, Bld: 84 mg/dL (ref 70–99)
Potassium: 3.4 mmol/L — ABNORMAL LOW (ref 3.5–5.1)
Sodium: 144 mmol/L (ref 135–145)

## 2020-03-07 LAB — TRIGLYCERIDES: Triglycerides: 79 mg/dL (ref <150)

## 2020-03-07 LAB — HIV ANTIBODY (ROUTINE TESTING W REFLEX): HIV Screen 4th Generation wRfx: NONREACTIVE

## 2020-03-07 LAB — CBC
HCT: 31.8 % — ABNORMAL LOW (ref 39.0–52.0)
Hemoglobin: 9.9 g/dL — ABNORMAL LOW (ref 13.0–17.0)
MCH: 25.3 pg — ABNORMAL LOW (ref 26.0–34.0)
MCHC: 31.1 g/dL (ref 30.0–36.0)
MCV: 81.1 fL (ref 80.0–100.0)
Platelets: 449 10*3/uL — ABNORMAL HIGH (ref 150–400)
RBC: 3.92 MIL/uL — ABNORMAL LOW (ref 4.22–5.81)
RDW: 18.7 % — ABNORMAL HIGH (ref 11.5–15.5)
WBC: 15.6 10*3/uL — ABNORMAL HIGH (ref 4.0–10.5)
nRBC: 0 % (ref 0.0–0.2)

## 2020-03-07 LAB — MAGNESIUM: Magnesium: 2 mg/dL (ref 1.7–2.4)

## 2020-03-07 LAB — PHOSPHORUS: Phosphorus: 3.7 mg/dL (ref 2.5–4.6)

## 2020-03-07 MED ORDER — CHLORHEXIDINE GLUCONATE CLOTH 2 % EX PADS
6.0000 | MEDICATED_PAD | Freq: Once | CUTANEOUS | Status: AC
  Administered 2020-03-07: 6 via TOPICAL

## 2020-03-07 MED ORDER — POTASSIUM CHLORIDE 10 MEQ/100ML IV SOLN
10.0000 meq | INTRAVENOUS | Status: AC
  Administered 2020-03-07 (×4): 10 meq via INTRAVENOUS
  Filled 2020-03-07 (×4): qty 100

## 2020-03-07 MED ORDER — GABAPENTIN 300 MG PO CAPS
300.0000 mg | ORAL_CAPSULE | ORAL | Status: DC
  Filled 2020-03-07: qty 1

## 2020-03-07 MED ORDER — ACETAMINOPHEN 500 MG PO TABS
1000.0000 mg | ORAL_TABLET | ORAL | Status: DC
  Filled 2020-03-07: qty 2

## 2020-03-07 MED ORDER — CELECOXIB 200 MG PO CAPS
200.0000 mg | ORAL_CAPSULE | ORAL | Status: DC
  Filled 2020-03-07: qty 1

## 2020-03-07 MED ORDER — CEFAZOLIN SODIUM-DEXTROSE 2-4 GM/100ML-% IV SOLN
2.0000 g | INTRAVENOUS | Status: DC
  Filled 2020-03-07 (×2): qty 100

## 2020-03-07 NOTE — Progress Notes (Signed)
PHARMACY - TOTAL PARENTERAL NUTRITION CONSULT NOTE   Indication: esophageal cancer causing obstruction   Patient Measurements: Height: 5\' 1"  (154.9 cm) Weight: 44.9 kg (99 lb) IBW/kg (Calculated) : 52.3 TPN AdjBW (KG): 44.9 Body mass index is 18.71 kg/m.   Assessment: 64 yo male who underwent EGD showing partially obstructing and malignant esophageal tumor in the distal esophagus; pt unable to eat/drink since EGD.  Glucose / Insulin: BG 84 on BMP, no hx of DM Electrolytes: K 3.4, all other WNL Renal: SCr 0.65 LFTs / TGs: TG 79 Prealbumin / albumin:  Intake / Output; MIVF: D5NS at 75 ml/hr GI Imaging: Surgeries / Procedures: EGD 10/6  Central access: PICC placed 03/06/20 PM TPN start date: --  Nutritional Goals (per RD recommendation on 10/10): Estimated Nutritional Needs:  Kcal:  1400-1600 kcal Protein:  70-90 g Fluid:  >/= 1.4 L/day  Goal TPN rate is 60 mL/hr (provides 82 g of protein, 13.8% dextrose, 30 g/L lipids and 1436 kcals per day)  If TPN needs to be ordered: Start TPN at 30 mL/hr at 1800 Electrolytes in TPN: 71mEq/L of Na, 18mEq/L of K, 57mEq/L of Ca, 12mEq/L of Mg, and 57mmol/L of Phos. Cl:Ac ratio 1:1 Add standard MVI and trace elements to TPN Initiate Sensitive q6h SSI and adjust as needed    Current Nutrition:  NPO  Plan:  K 3.4 - MD has ordered KCl 10 mEq x4 runs   Planning to hold off on TPN for today per MD, may be planning tube placement soon  Monitor TPN labs daily x3 days then on Mon/Thurs when stable; ordered for tomorrow  Rocky Morel 03/07/2020,8:07 AM

## 2020-03-07 NOTE — Clinical Social Work Note (Signed)
Per MD in morning progression rounds, plan for PEG placement tomorrow. Patient does not have insurance or a PCP. Discussed with [facility] representative. They should be able to provide the tube feeds through charity but patient would have to be on a bolus feed regimen. Sent secure chat to MD and dietician to notify.  Dayton Scrape, [place]

## 2020-03-07 NOTE — Progress Notes (Signed)
PROGRESS NOTE    Andrew Pratt   STM:[id number]  DOB: [birth year]  PCP: Patient, No Pcp Per    DOA: 03/05/2020 LOS: 1   Brief Narrative   Andrew Pratt is a 64 y.o. male with medical history of tobacco abuse, recently diagnosed esophageal cancer, who presented to the ED on 03/05/20 with difficulty swallowing. This had been an ongoing issue for 1/2 year.  Pt underwent EGD on 10/6 with Dr. Marius Ditch which showed a partially obstructing mass in the distal esophagus.  Biopsy showed invasive squamous cell carcinoma, referred to oncology.  Since EGD, pt reports regurgitation of any PO intake including liquids.  In the ED, afebrile, HR 96, BP 106/89.  Labs showed mild leukocytosis 11.8k, Hbg 11.3 (up from 10.6 in Sept), hypokalemia K 3.3.    Admitted to hospitalist service with GI consulted.  Oncology consulted as well.  Plan is to start patient on TPN, await staging CT chest/abdomen/pelvis to determine if patient will be surgical candidate for resection.     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  Staging CT chest/abdomen/pelvis confirms stage IV disease with positive supraclavicular lymph nodes, hilar and mediastinal nodal involvement also. --Oncology, General Surgery consulted.  GI signed off, no EGD indicated.  --Plan for PEG and port placement tomorrow, and lymph node biopsy --NPO ex ice chips / swish and spit liquids --Zofran PRN --IV hydration with D5-NS --Depending on staging results, will determine if surgical candidate and G vs J tube for enteral feeds  Hypokalemia -  Potassium 3.3 on admission, replaced by IV.  Today K 3.4 - will give 40 mEq by IVPB's today. --monitor BMP, replace as needed  Leukocytosis - WBC 11.8k on admission, with no fever, and no source of infection identified, likely reactive. --Monitor CBC and clinically for signs/symptoms of infection  Tobacco abuse - Nicotine patch   DVT prophylaxis: heparin injection 5,000 Units Start: 03/06/20 2200 Place and maintain sequential compression device Start: 03/06/20 1259SCD's for now    Diet:  Diet Orders (From admission, onward)    Start     Ordered   03/07/20 0836  Diet NPO time specified Except for: Ice Chips  Diet effective now       Question:  Except for  Answer:  Ice Chips   03/07/20 0835            Code Status: Full Code    Subjective 03/07/20    Pt seen at bedside.  He says his throat is still messed up, but otherwise denies abdominal pain.  Is having ice chips he says help dry mouth a lot, but continues to regurgitate frequently.  No other complaints.  He met with Dr. Dwyane Luo earlier and tells me plan for feeding tube and biopsy tomorrow.  He plans to do chemo.   Disposition Plan & Communication   Status is: Inpatient  Remains inpatient appropriate because:Inpatient level of care appropriate due to severity of illness.  No tolerance for oral intake, to have PEG placed and start tube feeds tomorrow.  D/C home once tube feeds at goal.  Dispo: The patient is from: Home              Anticipated d/c is to: Home              Anticipated d/c date is: 2-3 days              Patient currently is not medically stable to d/c.   Family Communication: none at bedside, will attempt to call or see at bedside this afternoon    Consults, Procedures, Significant Events   Consultants:   Gastroenterology  Oncology  Procedures:   None  Antimicrobials:  Anti-infectives (From admission, onward)   None        Objective   Vitals:   03/06/20 1927 03/07/20 0010 03/07/20  0348 03/07/20 1105  BP: (!) 141/82 (!) 150/79 131/78 129/81  Pulse: 65 77 73 81  Resp: _0 Temp: 99.5 F (37.5 C) 98.5 F (36.9 C) 98.1 F (36.7 C) 98.4 F (36.9 C)  TempSrc: Oral Oral Oral Oral  SpO2: 98% 100% 100% 100%  Weight:      Height:        Intake/Output Summary (Last 24 hours) at 03/07/2020 1351 Last data filed at 03/07/2020 0404 Gross per 24 hour  Intake 906.02 ml  Output 620 ml  Net 286.02 ml   Filed Weights   03/05/20 0905  Weight: 44.9 kg    Physical Exam:  General exam: awake, alert, no acute distress, underweight Respiratory system: CTAB, no wheezes, rales or rhonchi, normal respiratory effort, on room air. Cardiovascular system: normal S1/S2, RRR, no pedal edema.   Gastrointestinal system: soft, NT, ND, +bowel sounds. Psychiatry: normal mood, congruent affect, judgement and insight appear normal  Labs   Data Reviewed: I have personally reviewed following labs and imaging studies  CBC: Recent Labs  Lab 03/05/20 0907 03/06/20 2230 03/07/20 0600  WBC 11.8* 18.0* 15.6*  NEUTROABS 8.4*  --   --   HGB 11.3* 10.0* 9.9*  HCT 37.3* 32.5* 31.8*  MCV 81.3 80.6 81.1  PLT 568* 472* 217*   Basic Metabolic Panel: Recent Labs  Lab 03/05/20 0907 03/06/20 0521 03/06/20 2230 03/07/20 0600  NA 143  --  144 144  K 3.3*  --  3.3* 3.4*  CL 108  --  110 110  CO2 25  --  24 25  GLUCOSE 99  --  105* 84  BUN 10  --  7* 9  CREATININE 0.74  --  0.76 0.65  CALCIUM 9.6  --  9.3 9.4  MG  --  2.1  --  2.0  PHOS  --   --   --  3.7   GFR: Estimated Creatinine Clearance: 60 mL/min (by C-G formula based on SCr of 0.65 mg/dL). Liver Function Tests: Recent Labs  Lab 03/05/20 0907  AST 14*  ALT 9  ALKPHOS 94  BILITOT 0.7  PROT 8.3*  ALBUMIN 3.6   No results for input(s): LIPASE, AMYLASE in the last 168 hours. No results for input(s): AMMONIA in the last 168 hours. Coagulation Profile: Recent Labs  Lab 03/06/20 2230  INR 1.1   Cardiac  Enzymes: No results for input(s): CKTOTAL, CKMB, CKMBINDEX, TROPONINI in the last 168 hours. BNP (last 3 results) No results for input(s): PROBNP in the last 8760 hours. HbA1C: No results for input(s): HGBA1C in the last 72 hours. CBG: No results for input(s): GLUCAP in the last 168 hours. Lipid Profile: Recent Labs    03/07/20 0600  TRIG 79   Thyroid Function Tests: No results for input(s): TSH, T4TOTAL, FREET4, T3FREE, THYROIDAB in the last 72 hours. Anemia Panel: No results for input(s): VITAMINB12, FOLATE, FERRITIN, TIBC, IRON, RETICCTPCT in the last 72 hours. Sepsis Labs: No results for input(s): PROCALCITON, LATICACIDVEN in the last 168 hours.  Recent Results (from the past 240 hour(s))  SARS CORONAVIRUS 2 (TAT 6-24 HRS) Nasopharyngeal Nasopharyngeal Swab     Status: None   Collection Time: 03/01/20 11:37 AM   Specimen: Nasopharyngeal Swab  Result Value Ref Range Status   SARS Coronavirus 2 NEGATIVE NEGATIVE Final    Comment: (NOTE) SARS-CoV-2 target nucleic acids are NOT DETECTED.  The SARS-CoV-2 RNA is generally detectable in upper and lower respiratory specimens during the acute phase of infection. Negative results do not preclude SARS-CoV-2 infection, do not rule out co-infections with other pathogens, and should not be used as the sole basis for treatment or other patient management decisions. Negative results must be combined with clinical observations, patient history, and epidemiological information. The expected result is Negative.  Fact Sheet for Patients: SugarRoll.be  Fact Sheet for Healthcare Providers: https://www.woods-mathews.com/  This test is not yet approved or cleared by the [place] FDA and  has been authorized for detection and/or diagnosis of SARS-CoV-2 by FDA under an Emergency Use Authorization (EUA). This EUA will remain  in effect (meaning this test can be used) for the duration of  the COVID-19 declaration under Se ction 564(b)(1) of the Act, 21 U.S.C. section 360bbb-3(b)(1), unless the authorization is terminated or revoked sooner.  Performed at [facility], [place] [address] [place], [place] [address]   Respiratory Panel by RT PCR (Flu A&B, Covid) - Nasopharyngeal Swab     Status: None   Collection Time: 03/05/20 12:55 PM   Specimen: Nasopharyngeal Swab  Result Value Ref Range Status   SARS Coronavirus 2 by RT PCR NEGATIVE NEGATIVE Final    Comment: (NOTE) SARS-CoV-2 target nucleic acids are NOT DETECTED.  The SARS-CoV-2 RNA is generally detectable in upper respiratoy specimens during the acute phase of infection. The lowest concentration of SARS-CoV-2 viral copies this assay can detect is 131 copies/mL. A negative result does not preclude SARS-Cov-2 infection and should not be used as the sole basis for treatment or other patient management decisions. A negative result may occur with  improper specimen collection/handling, submission of specimen other than nasopharyngeal swab, presence of viral mutation(s) within the areas targeted by this assay, and inadequate number of viral copies (<131 copies/mL). A negative result must be combined with clinical observations, patient history, and epidemiological information. The expected result is Negative.  Fact Sheet for Patients:  PinkCheek.be  Fact Sheet for Healthcare Providers:  GravelBags.it  This test is no t yet approved or cleared by the [place] FDA and  has been authorized for detection and/or diagnosis of SARS-CoV-2 by FDA under an Emergency Use Authorization (EUA). This EUA will remain  in effect (meaning this test can be used) for the duration of the COVID-19 declaration under Section 564(b)(1) of the Act, 21 U.S.C. section 360bbb-3(b)(1), unless the authorization is terminated or revoked sooner.     Influenza A by PCR NEGATIVE  NEGATIVE Final   Influenza B by PCR NEGATIVE NEGATIVE Final    Comment: (NOTE) The Xpert Xpress SARS-CoV-2/FLU/RSV assay is intended as an aid in  the diagnosis of influenza from Nasopharyngeal swab specimens and  should not be used as a sole basis for treatment. Nasal washings and  aspirates are unacceptable for Xpert Xpress SARS-CoV-2/FLU/RSV  testing.  Fact Sheet for Patients: PinkCheek.be  Fact Sheet for Healthcare Providers: GravelBags.it  This test is not yet approved or cleared by the [place] FDA and  has been authorized for detection and/or diagnosis of SARS-CoV-2 by  FDA under an Emergency Use Authorization (EUA). This EUA will remain  in effect (meaning this test can be used) for the duration of the  Covid-19 declaration under Section 564(b)(1) of the Act, 21  U.S.C. section 360bbb-3(b)(1), unless the authorization is  terminated or revoked. Performed at [facility], [address] [place], [place] [address]       Imaging Studies   CT CHEST ABDOMEN PELVIS W CONTRAST  Result Date: 03/06/2020 CLINICAL DATA:  Esophageal cancer.  Staging. EXAM: CT CHEST, ABDOMEN, AND PELVIS WITH CONTRAST TECHNIQUE: Multidetector CT imaging of the chest, abdomen and pelvis was performed following the standard protocol during bolus administration of intravenous contrast. CONTRAST:  53m OMNIPAQUE IOHEXOL 300 MG/ML  SOLN COMPARISON:  Esophagram 02/18/2020. FINDINGS: CT CHEST FINDINGS Cardiovascular: No significant vascular findings. Right supraclavicular adenopathy exerts mild mass effect on the right internal jugular vein which remains patent. The heart size is normal. There is no pericardial effusion. Mediastinum/Nodes: As demonstrated on recent esophagram, there is a large irregular ulcerated mass involving the distal 3rd of the esophagus, extending approximately 6.3 cm in length on sagittal image 87/6. The proximal  esophagus is moderately dilated and fluid-filled. There are mildly prominent lymph nodes in the lower mediastinum, including an 11 mm subcarinal node on image 32/2 and 8 mm left hilar node on image 32/2. There are larger superior mediastinal lymph nodes, including a 2.5 cm right paratracheal node on image 11/2 and a 2.6 cm right supraclavicular node on image 6/2. The thyroid gland and trachea demonstrate no significant findings. Lungs/Pleura: No pleural effusion or pneumothorax. There are patchy ground-glass opacities and tree-in-bud nodularity in the left lower lobe with lesser involvement of the lingula, likely inflammatory. Largest nodular component in the superior segment of the lower lobe measures 5 mm on image 69/4. No right lung nodularity. Musculoskeletal/Chest wall: No chest wall mass or suspicious osseous findings. CT ABDOMEN AND PELVIS FINDINGS Hepatobiliary: The liver is normal in density without suspicious focal abnormality. No evidence of gallstones, gallbladder wall thickening or biliary dilatation. Pancreas: Unremarkable. No pancreatic ductal dilatation or surrounding inflammatory changes. Spleen: Normal in size without focal abnormality. Adrenals/Urinary Tract: Both adrenal glands appear normal. Both kidneys appear normal. No evidence of renal mass, urinary tract calculus or hydronephrosis. Mild bladder wall thickening likely relates to incomplete bladder distension. Stomach/Bowel: The stomach is decompressed with mild wall thickening, likely due to incomplete distension. No focal abnormality identified. The small bowel and appendix appear normal. There is moderate stool throughout the colon with scattered diverticula. No bowel wall thickening, distention or surrounding inflammation. Vascular/Lymphatic: A 10 mm lymph node in the gastrohepatic ligament on image 52/2 is suspicious for a nodal metastasis. No other enlarged lymph nodes are seen within the abdomen or pelvis. Mild aortic and iliac  atherosclerosis without acute vascular findings. The portal, superior mesenteric and splenic veins are patent. Reproductive: The prostate gland and seminal vesicles appear normal. Other: Small amount of free pelvic fluid. No focal fluid collection or peritoneal nodularity. Musculoskeletal: No acute or significant osseous findings. Mild lumbar spine facet arthropathy. IMPRESSION: 1. Large irregular ulcerated mass involving the distal 3rd of the esophagus consistent with known esophageal cancer. The proximal esophagus is moderately dilated  and fluid-filled. 2. Enlarged superior mediastinal and right supraclavicular lymph nodes consistent with metastatic disease. Mildly prominent gastrohepatic ligament lymph node suspicious for nodal metastasis. No evidence of distant metastatic disease. 3. Patchy ground-glass opacities and tree-in-bud nodularity in the left lower lobe and lingula, likely inflammatory. Attention on follow-up recommended. 4. Aortic Atherosclerosis (ICD10-I70.0). Electronically Signed   By: Richardean Sale M.D.   On: 03/06/2020 12:27   [place] EKG SITE RITE  Result Date: 03/06/2020 If Site Rite image not attached, placement could not be confirmed due to current cardiac rhythm.    Medications   Scheduled Meds:  Chlorhexidine Gluconate Cloth  6 each Topical Daily   heparin  5,000 Units Subcutaneous Q8H   nicotine  21 mg Transdermal Daily   sodium chloride flush  10-40 mL Intracatheter Q12H   Continuous Infusions:  dextrose 5 % and 0.9% NaCl Stopped (03/06/20 1446)       LOS: 1 day    Time spent: 25 minutes with > 50% spent in coordination of care and direct patient contact.    Ezekiel Slocumb, DO [facility]  03/07/2020, 1:51 PM    If 7PM-7AM, please contact night-coverage. How to contact the [facility] Attending or Consulting provider [place] or covering provider during after hours [place], for this patient?    1. Check the care team in [facility] and look for a)  attending/consulting [facility] provider listed and b) the [facility] team listed 2. Log into [email or web address] and use [place]'s universal password to access. If you do not have the password, please contact the hospital operator. 3. Locate the [facility] provider you are looking for under [facility] and page to a number that you can be directly reached. 4. If you still have difficulty reaching the provider, please page the [facility] (Director on Call) for the Hospitalists listed on amion for assistance.

## 2020-03-07 NOTE — TOC Initial Note (Signed)
Transition of Care [facility]) - Initial/Assessment Note    Patient Details  Name: Andrew Pratt MRN: [id number] Date of Birth: 30-Jul-1955  Transition of Care [facility]) CM/SW Contact:    Candie Chroman, LCSW Phone Number: 03/07/2020, 12:39 PM  Clinical Narrative: CSW met with patient. No supports at bedside. CSW introduced role and explained that discharge planning would be discussed. Patient confirmed he does not have a PCP. He lives in [place] so will try and make appointment at the [facility] and [facility]. CSW updated patient regarding tube feeds. No further concerns. CSW encouraged patient to contact CSW as needed. CSW will continue to follow patient for support and facilitate return home when stable.                 Expected Discharge Plan: Home/Self Care Barriers to Discharge: Inadequate or no insurance, Continued Medical Work up   Patient Goals and CMS Choice        Expected Discharge Plan and Services Expected Discharge Plan: Home/Self Care       Living arrangements for the past 2 months: Single Family Home                 DME Arranged: Tube feeding DME Agency: [employer] Date DME Agency Contacted: 03/07/20   Representative spoke with at DME Agency: Andree Coss            Prior Living Arrangements/Services Living arrangements for the past 2 months: [place]   Patient language and need for interpreter reviewed:: Yes Do you feel safe going back to the place where you live?: Yes      Need for Family Participation in Patient Care: Yes (Comment)     Criminal Activity/Legal Involvement Pertinent to Current Situation/Hospitalization: No - Comment as needed  Activities of Daily Living Home Assistive Devices/Equipment: None ADL Screening (condition at time of admission) Patient's cognitive ability adequate to safely complete daily activities?: Yes Is the patient deaf or have difficulty hearing?: No Does the patient have difficulty seeing, even  when wearing glasses/contacts?: No Does the patient have difficulty concentrating, remembering, or making decisions?: No Patient able to express need for assistance with ADLs?: No Does the patient have difficulty dressing or bathing?: No Independently performs ADLs?: Yes (appropriate for developmental age) Does the patient have difficulty walking or climbing stairs?: No Weakness of Legs: None Weakness of Arms/Hands: None  Permission Sought/Granted                  Emotional Assessment Appearance:: Appears stated age Attitude/Demeanor/Rapport: Engaged, Gracious Affect (typically observed): Accepting, Appropriate, Calm, Pleasant Orientation: : Oriented to Self, Oriented to Place, Oriented to  Time, Oriented to Situation Alcohol / Substance Use: Not Applicable Psych Involvement: No (comment)  Admission diagnosis:  Esophageal obstruction [K22.2] Difficulty swallowing [R13.10] Patient Active Problem List   Diagnosis Date Noted  . Esophageal obstruction 03/06/2020  . Difficulty swallowing 03/05/2020  . Hypokalemia 03/05/2020  . Leukocytosis 03/05/2020  . Tobacco abuse 03/05/2020  . Malignant neoplasm of lower third of esophagus ([place])   . Esophageal dysphagia    PCP:  Patient, No Pcp Per Pharmacy:   [id number] - [place], [place] [address] Phone: [phone] Fax: [phone]     Social Determinants of Health (SDOH) Interventions    Readmission Risk Interventions No flowsheet data found.

## 2020-03-07 NOTE — Consult Note (Addendum)
Hematology/Oncology Consult note [facility] Telephone:([phone] [phone]  Patient Care Team: Patient, No Pcp Per as PCP - General (General Practice) Clent Jacks, RN as Oncology Nurse Navigator   Name of the patient: Andrew Pratt  [id number]  March 07, 1956    Reason for consult: New diagnosis of esophageal cancer   Requesting physician Dr. Arbutus Ped  Date of visit: 03/07/2020    History of presenting illness-patient is a 64 year old male who was seen by Dr. Marius Ditch as an outpatient for symptoms of dysphagia.  He underwent EGD on 03/02/2020 which showed a large fungating and ulcerating mass with bleeding and stigmata of recent bleeding in the distal esophagus 30 cm from the incisors.  The degree of obstruction was severe enough that the scope could not be passed beyond the distal esophagus.  Patient was supposed to see me today as an outpatient but then got admitted to the hospital with severe dysphagia to the point that he was not able to swallow anything.  Patient currently reports feeling fatigued.  He lives with his niece and has had more than 20 pound weight loss in the last 2 months.  Denies any pain.  CT chest abdomen pelvis with contrast done yesterday shows a large irregular ulcerated mass in the distal third of the esophagus with proximal esophagus being dilated and fluid-filled.  Bilateral paratracheal and mediastinal adenopathy noted.  Also found to have a 2.6 cm right supraclavicular lymph node no evidence of liver or other organ metastases.  ECOG PS- 1  Pain scale- 0   Review of systems- Review of Systems  Constitutional: Positive for malaise/fatigue and weight loss. Negative for chills and fever.  HENT: Negative for congestion, ear discharge and nosebleeds.   Eyes: Negative for blurred vision.  Respiratory: Negative for cough, hemoptysis, sputum production, shortness of breath and wheezing.   Cardiovascular: Negative for chest  pain, palpitations, orthopnea and claudication.  Gastrointestinal: Negative for abdominal pain, blood in stool, constipation, diarrhea, heartburn, melena, nausea and vomiting.  Genitourinary: Negative for dysuria, flank pain, frequency, hematuria and urgency.  Musculoskeletal: Negative for back pain, joint pain and myalgias.  Skin: Negative for rash.  Neurological: Negative for dizziness, tingling, focal weakness, seizures, weakness and headaches.  Endo/Heme/Allergies: Does not bruise/bleed easily.  Psychiatric/Behavioral: Negative for depression and suicidal ideas. The patient does not have insomnia.     No Known Allergies  Patient Active Problem List   Diagnosis Date Noted  . Esophageal obstruction 03/06/2020  . Difficulty swallowing 03/05/2020  . Hypokalemia 03/05/2020  . Leukocytosis 03/05/2020  . Tobacco abuse 03/05/2020  . Malignant neoplasm of lower third of esophagus ([place])   . Esophageal dysphagia      Past Medical History:  Diagnosis Date  . Cancer [facility])      Past Surgical History:  Procedure Laterality Date  . ESOPHAGOGASTRODUODENOSCOPY (EGD) WITH PROPOFOL N/A 03/02/2020   Procedure: ESOPHAGOGASTRODUODENOSCOPY (EGD) WITH PROPOFOL;  Surgeon: Lin Landsman, MD;  Location: [facility];  Service: Gastroenterology;  Laterality: N/A;    Social History   Socioeconomic History  . Marital status: Single    Spouse name: Not on file  . Number of children: Not on file  . Years of education: Not on file  . Highest education level: Not on file  Occupational History  . Not on file  Tobacco Use  . Smoking status: Current Every Day Smoker    Packs/day: 0.50    Types: Cigarettes  . Smokeless tobacco: Never Used  Vaping Use  .  Vaping Use: Never used  Substance and Sexual Activity  . Alcohol use: Yes    Alcohol/week: 7.0 standard drinks    Types: 7 Cans of beer per week  . Drug use: Not Currently  . Sexual activity: Not on file  Other Topics Concern  . Not on  file  Social History Narrative  . Not on file   Social Determinants of Health   Financial Resource Strain:   . Difficulty of Paying Living Expenses: Not on file  Food Insecurity:   . Worried About Charity fundraiser in the Last Year: Not on file  . Ran Out of Food in the Last Year: Not on file  Transportation Needs:   . Lack of Transportation (Medical): Not on file  . Lack of Transportation (Non-Medical): Not on file  Physical Activity:   . Days of Exercise per Week: Not on file  . Minutes of Exercise per Session: Not on file  Stress:   . Feeling of Stress : Not on file  Social Connections:   . Frequency of Communication with Friends and Family: Not on file  . Frequency of Social Gatherings with Friends and Family: Not on file  . Attends Religious Services: Not on file  . Active Member of Clubs or Organizations: Not on file  . Attends Archivist Meetings: Not on file  . Marital Status: Not on file  Intimate Partner Violence:   . Fear of Current or Ex-Partner: Not on file  . Emotionally Abused: Not on file  . Physically Abused: Not on file  . Sexually Abused: Not on file     Family History  Problem Relation Age of Onset  . Cancer Mother        lung     Current Facility-Administered Medications:  .  acetaminophen (TYLENOL) tablet 650 mg, 650 mg, Oral, Q6H PRN **OR** acetaminophen (TYLENOL) suppository 650 mg, 650 mg, Rectal, Q6H PRN, Ivor Costa, MD .  Chlorhexidine Gluconate Cloth 2 % PADS 6 each, 6 each, Topical, Daily, Ezekiel Slocumb, DO, 6 each at 03/07/20 0910 .  dextrose 5 %-0.9 % sodium chloride infusion, , Intravenous, Continuous, Ivor Costa, MD, Stopped at 03/06/20 1446 .  heparin injection 5,000 Units, 5,000 Units, Subcutaneous, Q8H, Ivor Costa, MD .  morphine 2 MG/ML injection 1 mg, 1 mg, Intravenous, Q3H PRN, Ivor Costa, MD .  nicotine (NICODERM CQ - dosed in mg/24 hours) patch 21 mg, 21 mg, Transdermal, Daily, Ivor Costa, MD, 21 mg at 03/07/20  0908 .  ondansetron (ZOFRAN) injection 4 mg, 4 mg, Intravenous, Q8H PRN, Ivor Costa, MD, 4 mg at 03/05/20 2108 .  potassium chloride 10 mEq in 100 mL IVPB, 10 mEq, Intravenous, Q1 Hr x 4, Nicole Kindred A, DO, Last Rate: 100 mL/hr at 03/07/20 1134, 10 mEq at 03/07/20 1134 .  sodium chloride flush (NS) 0.9 % injection 10-40 mL, 10-40 mL, Intracatheter, Q12H, Nicole Kindred A, DO, 10 mL at 03/07/20 0910 .  sodium chloride flush (NS) 0.9 % injection 10-40 mL, 10-40 mL, Intracatheter, PRN, Ezekiel Slocumb, DO   Physical exam:  Vitals:   03/06/20 1927 03/07/20 0010 03/07/20 0348 03/07/20 1105  BP: (!) 141/82 (!) 150/79 131/78 129/81  Pulse: 65 77 73 81  Resp: 16 20 15    Temp: 99.5 F (37.5 C) 98.5 F (36.9 C) 98.1 F (36.7 C) 98.4 F (36.9 C)  TempSrc: Oral Oral Oral Oral  SpO2: 98% 100% 100% 100%  Weight:  Height:       Physical Exam Constitutional:      General: He is not in acute distress.    Comments: Appears fatigued  Eyes:     Pupils: Pupils are equal, round, and reactive to light.  Cardiovascular:     Rate and Rhythm: Normal rate and regular rhythm.     Heart sounds: Normal heart sounds.  Pulmonary:     Effort: Pulmonary effort is normal.     Breath sounds: Normal breath sounds.  Abdominal:     General: Bowel sounds are normal. There is no distension.     Palpations: Abdomen is soft.     Tenderness: There is no abdominal tenderness.  Musculoskeletal:     Cervical back: Normal range of motion.  Lymphadenopathy:     Comments: Right supraclavicular lymph node is not readily palpable  Skin:    General: Skin is warm and dry.  Neurological:     Mental Status: He is alert and oriented to person, place, and time.        CMP Latest Ref Rng & Units 03/07/2020  Glucose 70 - 99 mg/dL 84  BUN 8 - 23 mg/dL 9  Creatinine 0.61 - 1.24 mg/dL 0.65  Sodium 135 - 145 mmol/L 144  Potassium 3.5 - 5.1 mmol/L 3.4(L)  Chloride 98 - 111 mmol/L 110  CO2 22 - 32 mmol/L 25    Calcium 8.9 - 10.3 mg/dL 9.4  Total Protein 6.5 - 8.1 g/dL -  Total Bilirubin 0.3 - 1.2 mg/dL -  Alkaline Phos 38 - 126 U/L -  AST 15 - 41 U/L -  ALT 0 - 44 U/L -   CBC Latest Ref Rng & Units 03/07/2020  WBC 4.0 - 10.5 K/uL 15.6(H)  Hemoglobin 13.0 - 17.0 g/dL 9.9(L)  Hematocrit 39 - 52 % 31.8(L)  Platelets 150 - 400 K/uL 449(H)    @IMAGES @  CT CHEST ABDOMEN PELVIS W CONTRAST  Result Date: 03/06/2020 CLINICAL DATA:  Esophageal cancer.  Staging. EXAM: CT CHEST, ABDOMEN, AND PELVIS WITH CONTRAST TECHNIQUE: Multidetector CT imaging of the chest, abdomen and pelvis was performed following the standard protocol during bolus administration of intravenous contrast. CONTRAST:  29mL OMNIPAQUE IOHEXOL 300 MG/ML  SOLN COMPARISON:  Esophagram 02/18/2020. FINDINGS: CT CHEST FINDINGS Cardiovascular: No significant vascular findings. Right supraclavicular adenopathy exerts mild mass effect on the right internal jugular vein which remains patent. The heart size is normal. There is no pericardial effusion. Mediastinum/Nodes: As demonstrated on recent esophagram, there is a large irregular ulcerated mass involving the distal 3rd of the esophagus, extending approximately 6.3 cm in length on sagittal image 87/6. The proximal esophagus is moderately dilated and fluid-filled. There are mildly prominent lymph nodes in the lower mediastinum, including an 11 mm subcarinal node on image 32/2 and 8 mm left hilar node on image 32/2. There are larger superior mediastinal lymph nodes, including a 2.5 cm right paratracheal node on image 11/2 and a 2.6 cm right supraclavicular node on image 6/2. The thyroid gland and trachea demonstrate no significant findings. Lungs/Pleura: No pleural effusion or pneumothorax. There are patchy ground-glass opacities and tree-in-bud nodularity in the left lower lobe with lesser involvement of the lingula, likely inflammatory. Largest nodular component in the superior segment of the lower lobe  measures 5 mm on image 69/4. No right lung nodularity. Musculoskeletal/Chest wall: No chest wall mass or suspicious osseous findings. CT ABDOMEN AND PELVIS FINDINGS Hepatobiliary: The liver is normal in density without suspicious focal abnormality. No  evidence of gallstones, gallbladder wall thickening or biliary dilatation. Pancreas: Unremarkable. No pancreatic ductal dilatation or surrounding inflammatory changes. Spleen: Normal in size without focal abnormality. Adrenals/Urinary Tract: Both adrenal glands appear normal. Both kidneys appear normal. No evidence of renal mass, urinary tract calculus or hydronephrosis. Mild bladder wall thickening likely relates to incomplete bladder distension. Stomach/Bowel: The stomach is decompressed with mild wall thickening, likely due to incomplete distension. No focal abnormality identified. The small bowel and appendix appear normal. There is moderate stool throughout the colon with scattered diverticula. No bowel wall thickening, distention or surrounding inflammation. Vascular/Lymphatic: A 10 mm lymph node in the gastrohepatic ligament on image 52/2 is suspicious for a nodal metastasis. No other enlarged lymph nodes are seen within the abdomen or pelvis. Mild aortic and iliac atherosclerosis without acute vascular findings. The portal, superior mesenteric and splenic veins are patent. Reproductive: The prostate gland and seminal vesicles appear normal. Other: Small amount of free pelvic fluid. No focal fluid collection or peritoneal nodularity. Musculoskeletal: No acute or significant osseous findings. Mild lumbar spine facet arthropathy. IMPRESSION: 1. Large irregular ulcerated mass involving the distal 3rd of the esophagus consistent with known esophageal cancer. The proximal esophagus is moderately dilated and fluid-filled. 2. Enlarged superior mediastinal and right supraclavicular lymph nodes consistent with metastatic disease. Mildly prominent gastrohepatic ligament  lymph node suspicious for nodal metastasis. No evidence of distant metastatic disease. 3. Patchy ground-glass opacities and tree-in-bud nodularity in the left lower lobe and lingula, likely inflammatory. Attention on follow-up recommended. 4. Aortic Atherosclerosis (ICD10-I70.0). Electronically Signed   By: Richardean Sale M.D.   On: 03/06/2020 12:27   [place] EKG SITE RITE  Result Date: 03/06/2020 If Site Rite image not attached, placement could not be confirmed due to current cardiac rhythm.  DG ESOPHAGUS W SINGLE CM (SOL OR THIN BA)  Result Date: 02/18/2020 CLINICAL DATA:  Dysphagia. Change in voice. Prior choking injury. Swollen lymph node right side. EXAM: ESOPHOGRAM/BARIUM SWALLOW TECHNIQUE: Combined double contrast and single contrast examination performed using thick and thin barium. FLUOROSCOPY TIME:  Fluoroscopy Time:  1 minutes 18 seconds Radiation Exposure Index (if provided by the fluoroscopic device): 10.9 mGy COMPARISON:  No prior. FINDINGS: Mild deformity noted the posterior cervical esophagus secondary to prominent vertebral endplate osteophytes. Cervical esophagus is otherwise normal. No aspiration. An approximately 5 cm long irregular prominent stricture with prominent ulcerations noted in the distal thoracic esophagus. This is very suspicious for esophageal malignancy. Endoscopic evaluation is suggested. No hiatal hernia or reflux noted. IMPRESSION: An approximately 5 cm long irregular prominent stricture with prominent ulcerations noted in the distal thoracic esophagus. This is very suspicious for esophageal malignancy. Endoscopic evaluation is suggested. These results will be called to the ordering clinician or representative by the Radiologist Assistant, and communication documented in the PACS or [employer]. Electronically Signed   By: Marcello Moores  Register   On: 02/18/2020 10:10    Assessment and plan- Patient is a 65 y.o. 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  CT chest abdomen and pelvis with contrast does show pathological enlargement of the right supraclavicular lymph node which would be consistent with metastatic disease.  I will obtain an outpatient PET scan upon discharge.  Patient is unable to swallow any solid or liquid food at this  time and will need nutritional access.  With supraclavicular lymph node involvement I do not think that this patient is a surgical candidate.  Even if surgery is considered in the future a PEG tube should be okay.  TPN would not be a viable long-term option especially since patient does not have any insurance.  Typically J-tube is considered if there is surgery in the future picture.  The likelihood of patient getting curative surgery is very low at this time and therefore okay to proceed with PEG tube  I did discuss patient's case with Dr. Dahlia Byes and he is willing to do a PEG tube, port placement as well as right supraclavicular lymph node biopsy all in the same setting and hopefully patient can get this tomorrow.  Once PEG tube access is established patient will need to be started on tube feeds and patient can be discharged when tube feeds are at goal.  With regards to his squamous cell carcinoma patient will need palliative chemotherapy and I would recommend starting IV FOLFOX chemotherapy every 2 weeks as an outpatient.  Patient will go home with the chemo pump which will be discontinued 2 days later.  Discussed risks and benefits of chemotherapy including all but not limited to nausea, vomiting, low blood counts, risk of infections and hospitalization as well as peripheral neuropathy associated with oxaliplatin.  Treatment will be given with a palliative intent.  I will discuss all this and further detail at my outpatient  visit.  I will also consider adding palliative radiation therapy to palliate his dysphagia and I will have him see radiation oncology as an outpatient.  Normocytic anemia: I will check ferritin and iron studies B12 and folate and TSH tomorrow.  Patient and his family had multiple insightful questions and all of them were answered to their satisfaction   Total face to face encounter time for this patient visit was 40 min.   Time spent in  discussing patients case with Dr. Arbutus Ped and Dr. Dahlia Byes   Thank you for this kind referral and the opportunity to participate in the care of this  Patient   Visit Diagnosis 1. Esophageal obstruction   2. Esophageal mass                                                                                    3.  Goals of care counseling/discussion Dr. Randa Evens, MD, MPH [facility] at [facility] [phone] 03/07/2020  12:50 PM

## 2020-03-07 NOTE — Progress Notes (Addendum)
Nutrition Follow- Up Note   DOCUMENTATION CODES:   Severe malnutrition in context of acute illness/injury  INTERVENTION:   Once G-tube in place and ready for use, recommend:  Osmolite 1.5- 5 cans daily- Start with 1/2 can 5 times daily and increase as tolerated- Flush with 78ml of water before and after each tube feed  Regimen provides 1775kcal/day, 75g/day protein and 1443ml/day free water   Pt at high refeed risk; recommend monitor potassium, magnesium and phosphorus labs daily after tube feed initiation.   NUTRITION DIAGNOSIS:   Severe Malnutrition related to acute illness (new esophageal mass) as evidenced by 20 percent weight loss in 5 months, moderate to severe fat depletions, moderate to severe muscle depletions.  GOAL:   Patient will meet greater than or equal to 90% of their needs  MONITOR:   Labs, Weight trends, Skin, I & O's  REASON FOR ASSESSMENT:   Consult Assessment of nutrition requirement/status  ASSESSMENT:   65 y.o. male with medical history significant of tobacco abuse, recently diagnosed metastatic squamous cell carcinoma (esophageal), who presents with difficulty swallowing.   Met with pt in room today. Pt reports good appetite and oral intake at baseline. Pt reports that over the past 3-4 months  he has been having occasional "choking" episodes where food has been getting stuck in his throat. Pt reports that the choking has been progressively getting worse and that over the past week, he has been unable to keep anything down, including liquids. Pt reports that he normally drinks 3-4 Ensure supplements per day but he has been unable to keep these down over the past week. Pt reports a 30lb weight loss; he reports his UBW is ~130lbs. Per chart, pt has lost 24lbs(20%) over the past 5 months; this is significant weight loss. Pt currently NPO. Plan is for G tube, port placement and right supraclavicular lymph node biopsy tomorrow with Dr. Dahlia Byes. Pt is at high  refeed risk.   Medications reviewed and include: heparin, nicotine, cefazolin, KCl  Labs reviewed: K 3.4(L), P 3.7 wnl, Mg 2.0 wnl Wbc- 15.6(H), Hgb 9.9(L), Hct 31.8(L)  NUTRITION - FOCUSED PHYSICAL EXAM:    Most Recent Value  Orbital Region Mild depletion  Upper Arm Region Severe depletion  Thoracic and Lumbar Region Moderate depletion  Buccal Region Mild depletion  Temple Region Moderate depletion  Clavicle Bone Region Moderate depletion  Clavicle and Acromion Bone Region Moderate depletion  Scapular Bone Region Moderate depletion  Dorsal Hand Moderate depletion  Patellar Region Severe depletion  Anterior Thigh Region Severe depletion  Posterior Calf Region Severe depletion  Edema (RD Assessment) None  Hair Reviewed  Eyes Reviewed  Mouth Reviewed  Skin Reviewed  Nails Reviewed     Diet Order:   Diet Order            Diet NPO time specified  Diet effective midnight           Diet NPO time specified Except for: Ice Chips  Diet effective now                EDUCATION NEEDS:   Education needs have been addressed  Skin:  Skin Assessment: Reviewed RN Assessment  Last BM:  10/2  Height:   Ht Readings from Last 1 Encounters:  03/05/20 $RemoveB'5\' 1"'[id number]$  (1.549 m)    Weight:   Wt Readings from Last 1 Encounters:  03/05/20 44.9 kg    Ideal Body Weight:  50.9 kg  BMI:  Body mass index is 18.71 kg/m.  Estimated Nutritional Needs:   Kcal:  1500-1700kcal/day  Protein:  75-85g/day  Fluid:  >/= 1.4 L/day  Koleen Distance MS, RD, LDN Please refer to [facility] for RD and/or RD on-call/weekend/after hours pager

## 2020-03-07 NOTE — Consult Note (Signed)
Patient ID: Andrew Pratt, male   DOB: [birth year], 64 y.o.   MRN: [id number]  HPI Andrew Pratt is a 64 y.o. male seen in consultation at the request of Dr. Janese Banks for metastatic esophageal cancer. Case d/w her in detail. He reports having dysphagia for the last 3 months or so.  Reports that initially was for solids and then is also for liquids.  He feels that his food gets stuck in his throat.  He also reports significant weight loss for the last couple months 30 pounds.  No fevers no chills.  No hematemesis.  He smokes daily.  Further work-up including a head CT as well as a barium swallow.  He also had an EGD recently showing evidence of a distal obstructing esophageal mass consistent with squamous cell carcinoma.  CT scan personally reviewed showing evidence of supraclavicular adenopathy, there is also an esophageal mass in the distal esophagus. He also reports chronic fatigue but no chest pain or shortness of breath.  CBC shows evidence of leukocytosis with a white count of 15.6 anemia with a hemoglobin of 9.9 and platelets of 449.  BMP is completely normal.  NR is normal, LFTs are normal.    HPI  Past Medical History:  Diagnosis Date  . Cancer [facility])     Past Surgical History:  Procedure Laterality Date  . ESOPHAGOGASTRODUODENOSCOPY (EGD) WITH PROPOFOL N/A 03/02/2020   Procedure: ESOPHAGOGASTRODUODENOSCOPY (EGD) WITH PROPOFOL;  Surgeon: Lin Landsman, MD;  Location: [place];  Service: Gastroenterology;  Laterality: N/A;    Family History  Problem Relation Age of Onset  . Cancer Mother        lung    Social History Social History   Tobacco Use  . Smoking status: Current Every Day Smoker    Packs/day: 0.50    Types: Cigarettes  . Smokeless tobacco: Never Used  Vaping Use  . Vaping Use: Never used  Substance Use Topics  . Alcohol use: Yes    Alcohol/week: 7.0 standard drinks    Types: 7 Cans of beer per week  . Drug use: Not Currently    No Known  Allergies  Current Facility-Administered Medications  Medication Dose Route Frequency Provider Last Rate Last Admin  . acetaminophen (TYLENOL) tablet 650 mg  650 mg Oral Q6H PRN Ivor Costa, MD       Or  . acetaminophen (TYLENOL) suppository 650 mg  650 mg Rectal Q6H PRN Ivor Costa, MD      . Chlorhexidine Gluconate Cloth 2 % PADS 6 each  6 each Topical Daily Nicole Kindred A, DO   6 each at 03/07/20 0910  . dextrose 5 %-0.9 % sodium chloride infusion   Intravenous Continuous Ivor Costa, MD   Stopped at 03/06/20 1446  . heparin injection 5,000 Units  5,000 Units Subcutaneous Q8H Ivor Costa, MD      . morphine 2 MG/ML injection 1 mg  1 mg Intravenous Q3H PRN Ivor Costa, MD      . nicotine (NICODERM CQ - dosed in mg/24 hours) patch 21 mg  21 mg Transdermal Daily Ivor Costa, MD   21 mg at 03/07/20 0908  . ondansetron (ZOFRAN) injection 4 mg  4 mg Intravenous Q8H PRN Ivor Costa, MD   4 mg at 03/05/20 2108  . sodium chloride flush (NS) 0.9 % injection 10-40 mL  10-40 mL Intracatheter Q12H Nicole Kindred A, DO   10 mL at 03/07/20 0910  . sodium chloride flush (NS) 0.9 % injection 10-40  mL  10-40 mL Intracatheter PRN Ezekiel Slocumb, DO         Review of Systems Full ROS  was asked and was negative except for the information on the HPI  Physical Exam Blood pressure 129/81, pulse 81, temperature 98.4 F (36.9 C), temperature source Oral, resp. rate 15, height 5\' 1"  (1.549 m), weight 44.9 kg, SpO2 100 %. CONSTITUTIONAL: NAD EYES: Pupils are equal, round, , Sclera are non-icteric. EARS, NOSE, MOUTH AND THROAT:He is wearing a maskt. Hearing is intact to voice. LYMPH NODES: Supraclavicular LAD on the right side, hard and fixed.. RESPIRATORY:  Lungs are clear. There is normal respiratory effort, with equal breath sounds bilaterally, and without pathologic use of accessory muscles. CARDIOVASCULAR: Heart is regular without murmurs, gallops, or rubs. GI: The abdomen is  soft, nontender, and  nondistended. There are no palpable masses. There is no hepatosplenomegaly. There are normal bowel sounds in all quadrants. GU: Rectal deferred.   MUSCULOSKELETAL: Normal muscle strength and tone. No cyanosis or edema.   SKIN: Turgor is good and there are no pathologic skin lesions or ulcers. NEUROLOGIC: Motor and sensation is grossly normal. Cranial nerves are grossly intact. PSYCH:  Oriented to person, place and time. Affect is normal.  Data Reviewed  I have personally reviewed the patient's imaging, laboratory findings and medical records.    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  He does have significant dysphagia and malnutrition and is in need for gastrostomy tube.  I do think he will be a good candidate for robotic approach.  He also will need a Port-A-Cath placed as well as tissue biopsy of the supraclavicular node for appropriate staging.  Discussed with the patient detail.  Risks, benefits and possible implications including but not limited to, bleeding, infection bowel injuries, pneumothorax, vascular injuries.  He understands and wishes to proceed.  We will plan to do procedures tomorrow.  We will keep him n.p.o. after MN     Caroleen Hamman, MD FACS General Surgeon 03/07/2020, 1:51 PM

## 2020-03-08 ENCOUNTER — Inpatient Hospital Stay: Payer: Medicaid Other

## 2020-03-08 ENCOUNTER — Encounter
Admission: EM | Disposition: A | Discharge status: Home / Self Care | Payer: Self-pay | Source: Home / Self Care | Attending: Internal Medicine

## 2020-03-08 ENCOUNTER — Encounter: Payer: Self-pay | Admitting: Internal Medicine

## 2020-03-08 ENCOUNTER — Inpatient Hospital Stay: Payer: Medicaid Other | Admitting: Anesthesiology

## 2020-03-08 ENCOUNTER — Ambulatory Visit: Payer: Self-pay | Admitting: Gastroenterology

## 2020-03-08 DIAGNOSIS — E43 Unspecified severe protein-calorie malnutrition: Secondary | ICD-10-CM

## 2020-03-08 HISTORY — PX: PORTACATH PLACEMENT: SHX2246

## 2020-03-08 HISTORY — PX: MASS BIOPSY: SHX5445

## 2020-03-08 HISTORY — PX: GASTROSTOMY TUBE PLACEMENT: SHX655

## 2020-03-08 LAB — VITAMIN B12: Vitamin B-12: 532 pg/mL (ref 180–914)

## 2020-03-08 LAB — FOLATE: Folate: 15.3 ng/mL (ref >5.9)

## 2020-03-08 LAB — IRON AND TIBC
Iron: 22 ug/dL — ABNORMAL LOW (ref 45–182)
Saturation Ratios: 8 % — ABNORMAL LOW (ref 17.9–39.5)
TIBC: 291 ug/dL (ref 250–450)
UIBC: 269 ug/dL

## 2020-03-08 LAB — BASIC METABOLIC PANEL
Anion gap: 12 (ref 5–15)
BUN: 17 mg/dL (ref 8–23)
CO2: 22 mmol/L (ref 22–32)
Calcium: 9.7 mg/dL (ref 8.9–10.3)
Chloride: 107 mmol/L (ref 98–111)
Creatinine, Ser: 0.65 mg/dL (ref 0.61–1.24)
GFR, Estimated: >60 mL/min (ref >60)
Glucose, Bld: 79 mg/dL (ref 70–99)
Potassium: 3.8 mmol/L (ref 3.5–5.1)
Sodium: 141 mmol/L (ref 135–145)

## 2020-03-08 LAB — FERRITIN: Ferritin: 23 ng/mL — ABNORMAL LOW (ref 24–336)

## 2020-03-08 LAB — PHOSPHORUS: Phosphorus: 3.9 mg/dL (ref 2.5–4.6)

## 2020-03-08 LAB — MAGNESIUM: Magnesium: 2 mg/dL (ref 1.7–2.4)

## 2020-03-08 LAB — TSH: TSH: 2.955 u[IU]/mL (ref 0.350–4.500)

## 2020-03-08 SURGERY — INSERTION, TUNNELED CENTRAL VENOUS DEVICE, WITH PORT
Anesthesia: General

## 2020-03-08 MED ORDER — PHENYLEPHRINE HCL (PRESSORS) 10 MG/ML IV SOLN
INTRAVENOUS | Status: DC | PRN
  Administered 2020-03-08 (×7): 100 ug via INTRAVENOUS

## 2020-03-08 MED ORDER — LIDOCAINE HCL (PF) 1 % IJ SOLN
INTRAMUSCULAR | Status: AC
  Filled 2020-03-08: qty 30

## 2020-03-08 MED ORDER — ONDANSETRON HCL 4 MG/2ML IJ SOLN: 4.0000 mg | Freq: Once | INTRAMUSCULAR | Status: DC | PRN

## 2020-03-08 MED ORDER — MORPHINE SULFATE (PF) 2 MG/ML IV SOLN
1.0000 mg | INTRAVENOUS | Status: DC | PRN
  Administered 2020-03-09 – 2020-03-12 (×12): 1 mg via INTRAVENOUS
  Filled 2020-03-08 (×13): qty 1

## 2020-03-08 MED ORDER — SODIUM CHLORIDE 0.9 % IV SOLN
INTRAVENOUS | Status: DC | PRN
  Administered 2020-03-08: 16:00:00 1 mL via INTRAMUSCULAR

## 2020-03-08 MED ORDER — DEXAMETHASONE SODIUM PHOSPHATE 10 MG/ML IJ SOLN
INTRAMUSCULAR | Status: DC | PRN
  Administered 2020-03-08: 10 mg via INTRAVENOUS

## 2020-03-08 MED ORDER — OXYCODONE HCL 5 MG/5ML PO SOLN: 5.0000 mg | Freq: Once | ORAL | Status: DC | PRN

## 2020-03-08 MED ORDER — HEPARIN SODIUM (PORCINE) 5000 UNIT/ML IJ SOLN
INTRAMUSCULAR | Status: AC
  Filled 2020-03-08: qty 1

## 2020-03-08 MED ORDER — BUPIVACAINE LIPOSOME 1.3 % IJ SUSP
INTRAMUSCULAR | Status: DC | PRN
  Administered 2020-03-08: 20 mL

## 2020-03-08 MED ORDER — LIDOCAINE HCL (PF) 1 % IJ SOLN
INTRAMUSCULAR | Status: DC | PRN
  Administered 2020-03-08: 4.5 mL

## 2020-03-08 MED ORDER — FENTANYL CITRATE (PF) 100 MCG/2ML IJ SOLN: 25.0000 ug | INTRAMUSCULAR | Status: DC | PRN

## 2020-03-08 MED ORDER — FENTANYL CITRATE (PF) 100 MCG/2ML IJ SOLN
INTRAMUSCULAR | Status: AC
  Filled 2020-03-08: qty 2

## 2020-03-08 MED ORDER — ROCURONIUM BROMIDE 10 MG/ML (PF) SYRINGE
PREFILLED_SYRINGE | INTRAVENOUS | Status: AC
  Filled 2020-03-08: qty 10

## 2020-03-08 MED ORDER — PROPOFOL 10 MG/ML IV BOLUS
INTRAVENOUS | Status: DC | PRN
  Administered 2020-03-08: 80 mg via INTRAVENOUS

## 2020-03-08 MED ORDER — BUPIVACAINE-EPINEPHRINE 0.25% -1:200000 IJ SOLN
INTRAMUSCULAR | Status: DC | PRN
  Administered 2020-03-08: 30 mL

## 2020-03-08 MED ORDER — FENTANYL CITRATE (PF) 100 MCG/2ML IJ SOLN
INTRAMUSCULAR | Status: DC | PRN
Start: 2020-03-08 — End: 2020-03-08
  Administered 2020-03-08 (×2): 50 ug via INTRAVENOUS
  Administered 2020-03-08: 25 ug via INTRAVENOUS
  Administered 2020-03-08: 50 ug via INTRAVENOUS
  Administered 2020-03-08: 25 ug via INTRAVENOUS

## 2020-03-08 MED ORDER — ONDANSETRON HCL 4 MG/2ML IJ SOLN
INTRAMUSCULAR | Status: AC
  Filled 2020-03-08: qty 2

## 2020-03-08 MED ORDER — OXYCODONE HCL 5 MG PO TABS: 5.0000 mg | ORAL_TABLET | Freq: Once | ORAL | Status: DC | PRN

## 2020-03-08 MED ORDER — BUPIVACAINE-EPINEPHRINE (PF) 0.25% -1:200000 IJ SOLN
INTRAMUSCULAR | Status: AC
  Filled 2020-03-08: qty 30

## 2020-03-08 MED ORDER — ACETAMINOPHEN 10 MG/ML IV SOLN
INTRAVENOUS | Status: DC | PRN
  Administered 2020-03-08: 1000 mg via INTRAVENOUS

## 2020-03-08 MED ORDER — LIDOCAINE HCL (CARDIAC) PF 100 MG/5ML IV SOSY
PREFILLED_SYRINGE | INTRAVENOUS | Status: DC | PRN
  Administered 2020-03-08: 40 mg via INTRAVENOUS
  Administered 2020-03-08: 60 mg via INTRAVENOUS

## 2020-03-08 MED ORDER — LACTATED RINGERS IV SOLN: INTRAVENOUS | Status: DC | PRN

## 2020-03-08 MED ORDER — ROCURONIUM BROMIDE 100 MG/10ML IV SOLN
INTRAVENOUS | Status: DC | PRN
  Administered 2020-03-08: 10 mg via INTRAVENOUS
  Administered 2020-03-08: 20 mg via INTRAVENOUS
  Administered 2020-03-08: 30 mg via INTRAVENOUS

## 2020-03-08 MED ORDER — SODIUM CHLORIDE 0.9 % IV SOLN
INTRAVENOUS | Status: AC
  Filled 2020-03-08: qty 2

## 2020-03-08 MED ORDER — SODIUM CHLORIDE 0.9 % IV SOLN
2.0000 g | Freq: Once | INTRAVENOUS | Status: AC
  Administered 2020-03-08: 2 g via INTRAVENOUS

## 2020-03-08 MED ORDER — ACETAMINOPHEN 10 MG/ML IV SOLN
INTRAVENOUS | Status: AC
  Filled 2020-03-08: qty 100

## 2020-03-08 MED ORDER — DEXAMETHASONE SODIUM PHOSPHATE 10 MG/ML IJ SOLN
INTRAMUSCULAR | Status: AC
  Filled 2020-03-08: qty 1

## 2020-03-08 MED ORDER — BUPIVACAINE LIPOSOME 1.3 % IJ SUSP
INTRAMUSCULAR | Status: AC
  Filled 2020-03-08: qty 20

## 2020-03-08 MED ORDER — BUPIVACAINE-EPINEPHRINE 0.25% -1:200000 IJ SOLN
INTRAMUSCULAR | Status: DC | PRN
  Administered 2020-03-08: 4.5 mL

## 2020-03-08 MED ORDER — SUGAMMADEX SODIUM 200 MG/2ML IV SOLN
INTRAVENOUS | Status: DC | PRN
  Administered 2020-03-08: 200 mg via INTRAVENOUS

## 2020-03-08 MED ORDER — ONDANSETRON HCL 4 MG/2ML IJ SOLN
INTRAMUSCULAR | Status: DC | PRN
  Administered 2020-03-08: 4 mg via INTRAVENOUS

## 2020-03-08 SURGICAL SUPPLY — 94 items
ADH SKN CLS APL DERMABOND .7 (GAUZE/BANDAGES/DRESSINGS) ×2
APL PRP STRL LF DISP 70% ISPRP (MISCELLANEOUS) ×5
APPLIER CLIP 11 MED OPEN (CLIP) ×3
APR CLP MED 11 20 MLT OPN (CLIP) ×1
BAG BILE T-TUBES STRL (MISCELLANEOUS) ×3 IMPLANT
BAG DECANTER FOR FLEXI CONT (MISCELLANEOUS) ×6 IMPLANT
BAG DRN 9.5 2 ADJ BELT ADPR (MISCELLANEOUS) ×1
BAG SPEC RTRVL LRG 6X4 10 (ENDOMECHANICALS) ×1
BLADE CLIPPER SURG (BLADE) ×3 IMPLANT
BLADE SURG 15 STRL LF DISP TIS (BLADE) ×1 IMPLANT
BLADE SURG 15 STRL SS (BLADE) ×3
BLADE SURG SZ11 CARB STEEL (BLADE) ×3 IMPLANT
BOOT SUTURE AID YELLOW STND (SUTURE) ×3 IMPLANT
CANISTER SUCT 1200ML W/VALVE (MISCELLANEOUS) ×3 IMPLANT
CANNULA REDUC XI 12-8 STAPL (CANNULA) ×1
CANNULA REDUC XI 12-8MM STAPL (CANNULA) ×1
CANNULA REDUCER 12-8 DVNC XI (CANNULA) ×1 IMPLANT
CHLORAPREP W/TINT 26 (MISCELLANEOUS) ×15 IMPLANT
CLIP APPLIE 11 MED OPEN (CLIP) ×1 IMPLANT
COVER LIGHT HANDLE STERIS (MISCELLANEOUS) ×6 IMPLANT
COVER TIP SHEARS 8 DVNC (MISCELLANEOUS) ×1 IMPLANT
COVER TIP SHEARS 8MM DA VINCI (MISCELLANEOUS) ×2
COVER WAND RF STERILE (DRAPES) ×3 IMPLANT
DEFOGGER SCOPE WARMER CLEARIFY (MISCELLANEOUS) ×3 IMPLANT
DERMABOND ADVANCED (GAUZE/BANDAGES/DRESSINGS) ×4
DERMABOND ADVANCED .7 DNX12 (GAUZE/BANDAGES/DRESSINGS) ×2 IMPLANT
DRAPE 3/4 80X56 (DRAPES) ×3 IMPLANT
DRAPE ARM DVNC X/XI (DISPOSABLE) ×4 IMPLANT
DRAPE C-ARM XRAY 36X54 (DRAPES) ×6 IMPLANT
DRAPE COLUMN DVNC XI (DISPOSABLE) ×1 IMPLANT
DRAPE DA VINCI XI ARM (DISPOSABLE) ×8
DRAPE DA VINCI XI COLUMN (DISPOSABLE) ×2
DRAPE INCISE IOBAN 66X45 STRL (DRAPES) ×3 IMPLANT
DRAPE LAPAROTOMY 100X77 ABD (DRAPES) ×3 IMPLANT
DRSG TELFA 3X8 NADH (GAUZE/BANDAGES/DRESSINGS) ×3 IMPLANT
ELECT CAUTERY BLADE 6.4 (BLADE) ×3 IMPLANT
ELECT REM PT RETURN 9FT ADLT (ELECTROSURGICAL) ×3
ELECTRODE REM PT RTRN 9FT ADLT (ELECTROSURGICAL) ×1 IMPLANT
GEL ULTRASOUND 20GR AQUASONIC (MISCELLANEOUS) ×3 IMPLANT
GLOVE BIO SURGEON STRL SZ7 (GLOVE) ×12 IMPLANT
GOWN STRL REUS W/ TWL LRG LVL3 (GOWN DISPOSABLE) ×2 IMPLANT
GOWN STRL REUS W/TWL LRG LVL3 (GOWN DISPOSABLE) ×6
IRRIGATION STRYKERFLOW (MISCELLANEOUS) IMPLANT
IRRIGATOR STRYKERFLOW (MISCELLANEOUS)
IV NS 500ML (IV SOLUTION) ×3
IV NS 500ML BAXH (IV SOLUTION) ×1 IMPLANT
KIT PINK PAD W/HEAD ARE REST (MISCELLANEOUS) ×3
KIT PINK PAD W/HEAD ARM REST (MISCELLANEOUS) ×1 IMPLANT
KIT PORT POWER 8FR ISP CVUE (Port) ×3 IMPLANT
LABEL OR SOLS (LABEL) ×3 IMPLANT
NEEDLE HYPO 22GX1.5 SAFETY (NEEDLE) ×3 IMPLANT
NEEDLE HYPO 25X1 1.5 SAFETY (NEEDLE) ×3 IMPLANT
OBTURATOR OPTICAL STANDARD 8MM (TROCAR) ×2
OBTURATOR OPTICAL STND 8 DVNC (TROCAR) ×1
OBTURATOR OPTICALSTD 8 DVNC (TROCAR) ×1 IMPLANT
PACK BASIN MINOR (MISCELLANEOUS) ×3 IMPLANT
PACK LAP CHOLECYSTECTOMY (MISCELLANEOUS) ×3 IMPLANT
PACK PORT-A-CATH (MISCELLANEOUS) ×3 IMPLANT
PENCIL ELECTRO HAND CTR (MISCELLANEOUS) ×3 IMPLANT
POUCH SPECIMEN RETRIEVAL 10MM (ENDOMECHANICALS) ×3 IMPLANT
SEAL CANN UNIV 5-8 DVNC XI (MISCELLANEOUS) ×3 IMPLANT
SEAL XI 5MM-8MM UNIVERSAL (MISCELLANEOUS) ×6
SEALER VESSEL DA VINCI XI (MISCELLANEOUS) ×2
SEALER VESSEL EXT DVNC XI (MISCELLANEOUS) ×1 IMPLANT
SET TUBE SMOKE EVAC HIGH FLOW (TUBING) ×3 IMPLANT
SOLUTION ELECTROLUBE (MISCELLANEOUS) ×3 IMPLANT
SPONGE KITTNER 5P (MISCELLANEOUS) ×3 IMPLANT
SPONGE LAP 18X18 RF (DISPOSABLE) ×3 IMPLANT
SPONGE LAP 4X18 RFD (DISPOSABLE) ×3 IMPLANT
STAPLER CANNULA SEAL DVNC XI (STAPLE) ×1 IMPLANT
STAPLER CANNULA SEAL XI (STAPLE) ×2
SUT ETHILON 3-0 FS-10 30 BLK (SUTURE) ×3
SUT MNCRL 4-0 (SUTURE) ×3
SUT MNCRL 4-0 27XMFL (SUTURE) ×1
SUT MNCRL AB 4-0 PS2 18 (SUTURE) ×3 IMPLANT
SUT PLAIN 3 0 SH 27IN (SUTURE) ×3 IMPLANT
SUT PROLENE 2-0 (SUTURE) ×3
SUT PROLENE 2-0 RB1 36X2 ARM (SUTURE) ×1
SUT SILK 2 0 (SUTURE) ×3
SUT SILK 2-0 18XBRD TIE 12 (SUTURE) ×1 IMPLANT
SUT VIC AB 3-0 SH 27 (SUTURE) ×3
SUT VIC AB 3-0 SH 27X BRD (SUTURE) ×1 IMPLANT
SUT VICRYL 0 AB UR-6 (SUTURE) ×6 IMPLANT
SUTURE EHLN 3-0 FS-10 30 BLK (SUTURE) ×1 IMPLANT
SUTURE MNCRL 4-0 27XMF (SUTURE) ×1 IMPLANT
SUTURE PROLEN 2-0 RB1 36X2 ARM (SUTURE) ×1 IMPLANT
SYR 10ML LL (SYRINGE) ×3 IMPLANT
SYR 20ML LL LF (SYRINGE) ×3 IMPLANT
SYR 30ML LL (SYRINGE) ×3 IMPLANT
SYR 5ML LL (SYRINGE) ×3 IMPLANT
TAPE TRANSPORE STRL 2 31045 (GAUZE/BANDAGES/DRESSINGS) ×3 IMPLANT
TOWEL OR 17X26 4PK STRL BLUE (TOWEL DISPOSABLE) ×3 IMPLANT
TROCAR BALLN GELPORT 12X130M (ENDOMECHANICALS) ×3 IMPLANT
TUBE JEJUNO 18X14 (TUBING) ×3 IMPLANT

## 2020-03-08 NOTE — Op Note (Signed)
Robotic assisted laparoscopic gastrostomy tube   Pre-operative Diagnosis: malnutrition   Post-operative Diagnosis: same   Procedure: Right IJ port-a-cath placement under fluoroscopic and ultrasoung guidance Right excisional Lymph node neck biopsy  Robotic assisted laparoscopic G tube #18 FR   Surgeon: Caroleen Hamman, MD FACS   Anesthesia: Gen. with endotracheal tube   Findings: Tip of port catheter within SVC Frozen section c/w Metastatic squamous carcinoma Good position of the G tube, no evidence of leak or other injuries.   Estimated Blood Loss: 5 cc            Complications: none     Procedure Details  The patient was seen again in the Holding Room. The benefits, complications, treatment options, and expected outcomes were discussed with the Family. The risks of bleeding, infection, recurrence of symptoms, failure to resolve symptoms, bowel injury, any of which could require further surgery were reviewed .   The  family concurred with the proposed plan, giving informed consent.  The patient was taken to Operating Room, identified  and the procedure verified. A Time Out was held and the above information confirmed.         Prior to the induction of general anesthesia, antibiotic prophylaxis was administered. VTE prophylaxis was in place. Appropriate anesthesia was then administered and tolerated well. The chest was prepped with Chloraprep and draped in the sterile fashion. The patient was positioned in the supine position. Then the patient was placed in Trendelenburg position.  Patient was prepped and draped in sterile fashion and in a Trendelenburg position local anesthetic was infiltrated into the skin and subcutaneous tissues in the neck and anterior chest wall. The large bore needle was placed into the internal jugular vein under U/S guidance without difficulty and then the Seldinger wire was advanced. Fluoroscopy was utilized to confirm that the Seldinger wire was in the superior  vena cava.  An incision was made and a port pocket developed with blunt and electrocautery dissection. The introducer dilator was placed over the Seldinger wire the wire was removed. The previously flushed catheter was placed into the introducer dilator and the peel-away sheath was removed. The catheter length was confirmed and trimmed utilizing fluoroscopy for proper positioning. The catheter was then attached to the previously flushed port. The port was placed into the pocket. The port was held in with 2-0 Prolenes and flushed for function and heparin locked.  The wound was closed with interrupted 3-0 Vicryl followed by 4-0 subcuticular Monocryl sutures.  Attention was turned to the right neck , a 3 cm incision was created with a 15 blade knife and the SCM muscle was retracted, we encounter a hard node. Partial Excision performed with cautery and lymphatic channels ligated. I wanted to avoid any potential deep or nerve injuries but at the same time I wanted to obtain good tissue for diagnosis. Good  Hemostasis observed. Wound closed with 3-0 vicryl for the fascia and 4-0 for the skin. Dermabond used to coat the skin on both neck and chest incisions We remove the neck and chest draped and then  Attention was then turned to the abdomen where the abdominal wall was prepped and draped In the standard fashion.  Veres needle approach was used after creating small stab incision on the Left Upper quadrant. Two clicks were heard with appropriate intra-abdominal pressures.  Pneumoperitoneum was then created with CO2 and tolerated well without any adverse changes in the patient's vital signs.  Three 8-mm ports were placed under direct vision.  Inspection revealed No evidence of metastatic disease and no adhesions.. I grasped the body  stomach and tented toward the abdominal wall, small left subcostal incision created to placed Gastrostomy tube under direct visualization.    The patient was positioned  in  reverse Trendelenburg, robot was brought to the surgical field and docked in the standard fashion.  We made sure all the instrumentation was kept indirect view at all times and that there were no collision between the arms. I scrubbed out and went to the console. Using the scissors I performed a gastrotomy and confirmed that I was intraluminally. A partial pursestring suture was placed using 3 0 V-Loc suture around the G-tube in an inner fashion. I placed the G-tube through the gastrotomy in direct fashion.  I was able to get my scrub to flush saline via the G-tube confirming the proper position of the tube intraluminally.  The balloon was inflated and pulled back.  We then were able to tied the first pursestring in the standard fashion and completing our circumferential outer pursestring with the 2 0 V-Loc sutures Inspection of the  upper quadrant was performed. No bleeding, bile duct injury or leak, or bowel injury was noted.  All the needles were removed under direct visualization. Robotic instruments and robotic arms were undocked in the standard fashion.  I scrubbed back in. Liposomal Marcaine was used to infiltrate the abdominal wall at all incision sites   Pneumoperitoneum was released and ports removed.  Marland Kitchen 4-0 subcuticular Monocryl was used to close the skin. Dermabond was  applied.  The patient was then extubated and brought to the recovery room in stable condition. Sponge, lap, and needle counts were correct at closure and at the conclusion of the case.               Caroleen Hamman, MD, FACS

## 2020-03-08 NOTE — Anesthesia Postprocedure Evaluation (Signed)
Anesthesia Post Note  Patient: Andrew Pratt  Procedure(s) Performed: INSERTION PORT-A-CATH (N/A ) NECK MASS BIOPSY (N/A ) XI ROBOTIC ASSISTED JEJUNOSTOMY TUBE PLACEMENT (N/A )  Patient location during evaluation: PACU Anesthesia Type: General Level of consciousness: awake and alert Pain management: pain level controlled Vital Signs Assessment: post-procedure vital signs reviewed and stable Respiratory status: spontaneous breathing, nonlabored ventilation, respiratory function stable and patient connected to nasal cannula oxygen Cardiovascular status: blood pressure returned to baseline and stable Postop Assessment: no apparent nausea or vomiting Anesthetic complications: no   No complications documented.   Last Vitals:  Vitals:   03/08/20 1855 03/08/20 2007  BP: (!) 102/58 105/67  Pulse: 81 69  Resp: 16 20  Temp: (!) 36.4 C 37.1 C  SpO2: 96% 94%    Last Pain:  Vitals:   03/08/20 2007  TempSrc: Oral  PainSc:                  Martha Clan

## 2020-03-08 NOTE — TOC Progression Note (Signed)
Transition of Care [facility]) - Progression Note    Patient Details  Name: Andrew Pratt MRN: [id number] Date of Birth: April 27, 1956  Transition of Care [facility]) CM/SW [place], LCSW Phone Number: 03/08/2020, 11:37 AM  Clinical Narrative: Notified [place] representative that the dietician's note was put in yesterday with tube feed regimen plan. Made new patient appointment at [place] for Monday 10/25 at 2:30 pm. Appointment information added to AVS. Per MD, patient will need a home health RN at discharge. Will make referral once we know when patient will discharge.     Expected Discharge Plan: Home/Self Care Barriers to Discharge: Inadequate or no insurance, Continued Medical Work up  Expected Discharge Plan and Services Expected Discharge Plan: Home/Self Care       Living arrangements for the past 2 months: Single Family Home                 DME Arranged: Tube feeding DME Agency: [employer] Date DME Agency Contacted: 03/07/20   Representative spoke with at DME Agency: [facility] Determinants of Health ([place]) Interventions    Readmission Risk Interventions No flowsheet data found.

## 2020-03-08 NOTE — Anesthesia Procedure Notes (Signed)

## 2020-03-08 NOTE — Progress Notes (Signed)
PROGRESS NOTE    Andrew Pratt   BDZ:[id number]  DOB: 13-Apr-1956  PCP: Patient, No Pcp Per    DOA: 03/05/2020 LOS: 2   Brief Narrative   Andrew Pratt is a 64 y.o. male with medical history of tobacco abuse, recently diagnosed esophageal cancer, who presented to the ED on 03/05/20 with difficulty swallowing. This had been an ongoing issue for 1/2 year.  Pt underwent EGD on 10/6 with Dr. Marius Ditch which showed a partially obstructing mass in the distal esophagus.  Biopsy showed invasive squamous cell carcinoma, referred to oncology.  Since EGD, pt reports regurgitation of any PO intake including liquids.  In the ED, afebrile, HR 96, BP 106/89.  Labs showed mild leukocytosis 11.8k, Hbg 11.3 (up from 10.6 in Sept), hypokalemia K 3.3.    Admitted to hospitalist service with GI consulted.  Oncology consulted as well.  Plan is to start patient on TPN, await staging CT chest/abdomen/pelvis to determine if patient will be surgical candidate for resection.     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  Staging CT chest/abdomen/pelvis confirms stage IV disease with positive supraclavicular lymph nodes, hilar and mediastinal nodal involvement also.   --Oncology, General Surgery consulted.  GI signed off, no EGD indicated.  --Plan for PEG and port placement tomorrow, and lymph node biopsy --NPO ex ice chips / swish and spit liquids --IV Zofran PRN --IV hydration with D5-NS  Hypokalemia - Potassium 3.3 on admission, replaced by  IV.  Today K 3.4 - will give 40 mEq by IVPB's today. --monitor BMP, replace as needed  Leukocytosis - WBC 11.8k on admission, with no fever, and no source of infection identified, likely reactive. --Monitor CBC and clinically for signs/symptoms of infection  Tobacco abuse - Nicotine patch   DVT prophylaxis: SCD's Start: 03/07/20 1357 heparin injection 5,000 Units Start: 03/06/20 2200 Place and maintain sequential compression device Start: 03/06/20 1259SCD's for now    Diet:  Diet Orders (From admission, onward)    Start     Ordered   03/08/20 0001  Diet NPO time specified  Diet effective midnight        03/07/20 1356            Code Status: Full Code    Subjective 03/08/20    Pt seen at bedside.  He is waiting to go to surgery, anxious to get procedures done.  Throat still bothers him but says morphine helps, reminded he can ask for it.  No other acute complaints.   Disposition Plan & Communication   Status is: Inpatient  Remains inpatient appropriate because:Inpatient level of care appropriate due to severity of illness.  No tolerance for oral intake, to have PEG placed and start tube feeds today 10/12.  D/C home once tolerating tube feeds at goal.   Dispo: The patient is from: Home              Anticipated d/c is to: Home              Anticipated  d/c date is: 2-3 days              Patient currently is not medically stable to d/c.   Family Communication: none at bedside, will attempt to call or see at bedside this afternoon    Consults, Procedures, Significant Events   Consultants:   Gastroenterology  Oncology  Procedures:   None  Antimicrobials:  Anti-infectives (From admission, onward)   Start     Dose/Rate Route Frequency Ordered Stop   03/08/20 0600  ceFAZolin (ANCEF) IVPB 2g/100 mL premix        2 g 200 mL/hr over 30 Minutes Intravenous On call to O.R. 03/07/20 1356 03/09/20 0559        Objective   Vitals:   03/07/20 1959 03/07/20 2306  03/08/20 0618 03/08/20 1203  BP: 114/76 122/79 104/71 104/67  Pulse: 92 90 78 75  Resp:  18 16 20   Temp: 98 F (36.7 C) 98.2 F (36.8 C) 98.3 F (36.8 C) 98.5 F (36.9 C)  TempSrc: Oral Oral Oral Oral  SpO2: 100% 100% 97% 99%  Weight:      Height:        Intake/Output Summary (Last 24 hours) at 03/08/2020 1307 Last data filed at 03/08/2020 0618 Gross per 24 hour  Intake 0 ml  Output 400 ml  Net -400 ml   Filed Weights   03/05/20 0905  Weight: 44.9 kg    Physical Exam:  General exam: awake, alert, no acute distress, underweight Respiratory system: CTAB, normal respiratory effort, on room air. Cardiovascular system: normal S1/S2, RRR, no pedal edema.   Gastrointestinal system: soft, non-tender, +bowel sounds. Neurologic: A&Ox4, no gross focal deficits, normal speech    Labs   Data Reviewed: I have personally reviewed following labs and imaging studies  CBC: Recent Labs  Lab 03/05/20 0907 03/06/20 2230 03/07/20 0600  WBC 11.8* 18.0* 15.6*  NEUTROABS 8.4*  --   --   HGB 11.3* 10.0* 9.9*  HCT 37.3* 32.5* 31.8*  MCV 81.3 80.6 81.1  PLT 568* 472* 174*   Basic Metabolic Panel: Recent Labs  Lab 03/05/20 0907 03/06/20 0521 03/06/20 2230 03/07/20 0600 03/08/20 0631  NA 143  --  144 144 141  K 3.3*  --  3.3* 3.4* 3.8  CL 108  --  110 110 107  CO2 25  --  24 25 22   GLUCOSE 99  --  105* 84 79  BUN 10  --  7* 9 17  CREATININE 0.74  --  0.76 0.65 0.65  CALCIUM 9.6  --  9.3 9.4 9.7  MG  --  2.1  --  2.0 2.0  PHOS  --   --   --  3.7 3.9   GFR: Estimated Creatinine Clearance: 60 mL/min (by C-G formula based on SCr of 0.65 mg/dL). Liver Function Tests: Recent Labs  Lab 03/05/20 0907  AST 14*  ALT 9  ALKPHOS 94  BILITOT 0.7  PROT 8.3*  ALBUMIN 3.6   No results for input(s): LIPASE, AMYLASE in the last 168 hours. No results for input(s): AMMONIA in the last 168 hours. Coagulation Profile: Recent Labs  Lab 03/06/20 2230  INR 1.1   Cardiac  Enzymes: No results for input(s): CKTOTAL, CKMB, CKMBINDEX, TROPONINI in the last 168 hours. BNP (last 3 results) No results for input(s): PROBNP in the last 8760 hours. HbA1C: No results for input(s): HGBA1C in the last 72 hours. CBG: No results for input(s): GLUCAP in the last 168 hours.  Lipid Profile: Recent Labs    03/07/20 0600  TRIG 79   Thyroid Function Tests: Recent Labs    03/08/20 0631  TSH 2.955   Anemia Panel: Recent Labs    03/08/20 0631 03/08/20 0637  VITAMINB12  --  532  FOLATE 15.3  --   FERRITIN 23*  --   TIBC 291  --   IRON 22*  --    Sepsis Labs: No results for input(s): PROCALCITON, LATICACIDVEN in the last 168 hours.  Recent Results (from the past 240 hour(s))  SARS CORONAVIRUS 2 (TAT 6-24 HRS) Nasopharyngeal Nasopharyngeal Swab     Status: None   Collection Time: 03/01/20 11:37 AM   Specimen: Nasopharyngeal Swab  Result Value Ref Range Status   SARS Coronavirus 2 NEGATIVE NEGATIVE Final    Comment: (NOTE) SARS-CoV-2 target nucleic acids are NOT DETECTED.  The SARS-CoV-2 RNA is generally detectable in upper and lower respiratory specimens during the acute phase of infection. Negative results do not preclude SARS-CoV-2 infection, do not rule out co-infections with other pathogens, and should not be used as the sole basis for treatment or other patient management decisions. Negative results must be combined with clinical observations, patient history, and epidemiological information. The expected result is Negative.  Fact Sheet for Patients: SugarRoll.be  Fact Sheet for Healthcare Providers: https://www.woods-mathews.com/  This test is not yet approved or cleared by the [place] FDA and  has been authorized for detection and/or diagnosis of SARS-CoV-2 by FDA under an Emergency Use Authorization (EUA). This EUA will remain  in effect (meaning this test can be used) for the duration of  the COVID-19 declaration under Se ction 564(b)(1) of the Act, 21 U.S.C. section 360bbb-3(b)(1), unless the authorization is terminated or revoked sooner.  Performed at [facility], [place] [address] [place], [place] [address]   Respiratory Panel by RT PCR (Flu A&B, Covid) - Nasopharyngeal Swab     Status: None   Collection Time: 03/05/20 12:55 PM   Specimen: Nasopharyngeal Swab  Result Value Ref Range Status   SARS Coronavirus 2 by RT PCR NEGATIVE NEGATIVE Final    Comment: (NOTE) SARS-CoV-2 target nucleic acids are NOT DETECTED.  The SARS-CoV-2 RNA is generally detectable in upper respiratoy specimens during the acute phase of infection. The lowest concentration of SARS-CoV-2 viral copies this assay can detect is 131 copies/mL. A negative result does not preclude SARS-Cov-2 infection and should not be used as the sole basis for treatment or other patient management decisions. A negative result may occur with  improper specimen collection/handling, submission of specimen other than nasopharyngeal swab, presence of viral mutation(s) within the areas targeted by this assay, and inadequate number of viral copies (<131 copies/mL). A negative result must be combined with clinical observations, patient history, and epidemiological information. The expected result is Negative.  Fact Sheet for Patients:  PinkCheek.be  Fact Sheet for Healthcare Providers:  GravelBags.it  This test is no t yet approved or cleared by the [place] FDA and  has been authorized for detection and/or diagnosis of SARS-CoV-2 by FDA under an Emergency Use Authorization (EUA). This EUA will remain  in effect (meaning this test can be used) for the duration of the COVID-19 declaration under Section 564(b)(1) of the Act, 21 U.S.C. section 360bbb-3(b)(1), unless the authorization is terminated or revoked sooner.     Influenza A by PCR NEGATIVE  NEGATIVE Final   Influenza B by PCR NEGATIVE NEGATIVE Final    Comment: (NOTE) The Xpert  Xpress SARS-CoV-2/FLU/RSV assay is intended as an aid in  the diagnosis of influenza from Nasopharyngeal swab specimens and  should not be used as a sole basis for treatment. Nasal washings and  aspirates are unacceptable for Xpert Xpress SARS-CoV-2/FLU/RSV  testing.  Fact Sheet for Patients: PinkCheek.be  Fact Sheet for Healthcare Providers: GravelBags.it  This test is not yet approved or cleared by the [place] FDA and  has been authorized for detection and/or diagnosis of SARS-CoV-2 by  FDA under an Emergency Use Authorization (EUA). This EUA will remain  in effect (meaning this test can be used) for the duration of the  Covid-19 declaration under Section 564(b)(1) of the Act, 21  U.S.C. section 360bbb-3(b)(1), unless the authorization is  terminated or revoked. Performed at [facility], [address], [place], [place] [address]       Imaging Studies   No results found.   Medications   Scheduled Meds: . acetaminophen  1,000 mg Oral On Call to OR  . celecoxib  200 mg Oral On Call to OR  . Chlorhexidine Gluconate Cloth  6 each Topical Daily  . gabapentin  300 mg Oral On Call to OR  . heparin  5,000 Units Subcutaneous Q8H  . nicotine  21 mg Transdermal Daily  . sodium chloride flush  10-40 mL Intracatheter Q12H   Continuous Infusions: .  ceFAZolin (ANCEF) IV    . dextrose 5 % and 0.9% NaCl Stopped (03/06/20 1446)       LOS: 2 days    Time spent: 25 minutes with > 50% spent in coordination of care and direct patient contact.    Ezekiel Slocumb, DO [facility]  03/08/2020, 1:07 PM    If 7PM-7AM, please contact night-coverage. How to contact the [facility] Attending or Consulting provider [place] or covering provider during after hours [place], for this patient?    1. Check the care team in  [facility] and look for a) attending/consulting [facility] provider listed and b) the [facility] team listed 2. Log into [email or web address] and use 's universal password to access. If you do not have the password, please contact the hospital operator. 3. Locate the [facility] provider you are looking for under [facility] and page to a number that you can be directly reached. 4. If you still have difficulty reaching the provider, please page the [facility] (Director on Call) for the Hospitalists listed on amion for assistance.

## 2020-03-08 NOTE — Anesthesia Preprocedure Evaluation (Signed)
Anesthesia Evaluation  Patient identified by MRN, date of birth, ID band Patient awake    Reviewed: Allergy & Precautions, NPO status , Patient's Chart, lab work & pertinent test results  History of Anesthesia Complications Negative for: history of anesthetic complications  Airway Mallampati: II  TM Distance: >3 FB Neck ROM: Full    Dental  (+) Poor Dentition, Missing, Chipped, Dental Advisory Given   Pulmonary neg sleep apnea, neg COPD, Current Smoker and Patient abstained from smoking.,    breath sounds clear to auscultation- rhonchi (-) wheezing      Cardiovascular Exercise Tolerance: Good METS(-) hypertension(-) CAD, (-) Past MI, (-) Cardiac Stents and (-) CABG negative cardio ROS  (-) dysrhythmias  Rhythm:Regular Rate:Normal - Systolic murmurs and - Diastolic murmurs    Neuro/Psych neg Seizures negative neurological ROS  negative psych ROS   GI/Hepatic Neg liver ROS, neg GERD  ,Esophageal cancer, dysphagia   Endo/Other  negative endocrine ROSneg diabetes  Renal/GU negative Renal ROS     Musculoskeletal negative musculoskeletal ROS (+)   Abdominal (+) - obese,   Peds  Hematology negative hematology ROS (+)   Anesthesia Other Findings Past Medical History: No date: Cancer ([place])  Reproductive/Obstetrics                             Anesthesia Physical  Anesthesia Plan  ASA: III  Anesthesia Plan: General   Post-op Pain Management:    Induction: Intravenous  PONV Risk Score and Plan: 3 and Dexamethasone, Ondansetron, Midazolam and Treatment may vary due to age or medical condition  Airway Management Planned: Oral ETT  Additional Equipment: None  Intra-op Plan:   Post-operative Plan: Extubation in OR  Informed Consent: I have reviewed the patients History and Physical, chart, labs and discussed the procedure including the risks, benefits and alternatives for the proposed  anesthesia with the patient or authorized representative who has indicated his/her understanding and acceptance.     Dental advisory given  Plan Discussed with: CRNA  Anesthesia Plan Comments: (Discussed risks of anesthesia with patient, including PONV, sore throat, lip/dental damage. Rare risks discussed as well, such as cardiorespiratory and neurological sequelae. Patient understands.)        Anesthesia Quick Evaluation

## 2020-03-08 NOTE — Progress Notes (Signed)
Preoperative Review   Patient is met in the preoperative holding area. The history is reviewed in the chart and with the patient. I personally reviewed the options and rationale as well as the risks of this procedure that have been previously discussed with the patient. All questions asked by the patient and/or family were answered to their satisfaction.  Patient agrees to proceed with this procedure at this time.  Larra Crunkleton M.D. FACS   

## 2020-03-08 NOTE — Transfer of Care (Signed)
Immediate Anesthesia Transfer of Care Note  Patient: Andrew Pratt  Procedure(s) Performed: INSERTION PORT-A-CATH (N/A ) NECK MASS BIOPSY (N/A ) XI ROBOTIC ASSISTED JEJUNOSTOMY TUBE PLACEMENT (N/A )  Patient Location: PACU  Anesthesia Type:General  Level of Consciousness: drowsy  Airway & Oxygen Therapy: Patient Spontanous Breathing and Patient connected to face mask oxygen  Post-op Assessment: Report given to RN and Post -op Vital signs reviewed and stable  Post vital signs: Reviewed and stable  Last Vitals:  Vitals Value Taken Time  BP 116/54 03/08/20 1801  Temp    Pulse 81 03/08/20 1805  Resp 30 03/08/20 1805  SpO2 100 % 03/08/20 1805  Vitals shown include unvalidated device data.  Last Pain:  Vitals:   03/08/20 1440  TempSrc: Tympanic  PainSc:       Patients Stated Pain Goal: 3 (41/03/01 3143)  Complications: No complications documented.

## 2020-03-09 ENCOUNTER — Encounter: Payer: Self-pay | Admitting: Surgery

## 2020-03-09 DIAGNOSIS — J690 Pneumonitis due to inhalation of food and vomit: Secondary | ICD-10-CM

## 2020-03-09 DIAGNOSIS — R531 Weakness: Secondary | ICD-10-CM

## 2020-03-09 LAB — BASIC METABOLIC PANEL
Anion gap: 7 (ref 5–15)
BUN: 20 mg/dL (ref 8–23)
CO2: 25 mmol/L (ref 22–32)
Calcium: 9.6 mg/dL (ref 8.9–10.3)
Chloride: 106 mmol/L (ref 98–111)
Creatinine, Ser: 0.63 mg/dL (ref 0.61–1.24)
GFR, Estimated: >60 mL/min (ref >60)
Glucose, Bld: 186 mg/dL — ABNORMAL HIGH (ref 70–99)
Potassium: 4.2 mmol/L (ref 3.5–5.1)
Sodium: 138 mmol/L (ref 135–145)

## 2020-03-09 LAB — CEA: CEA: 2.2 ng/mL (ref 0.0–4.7)

## 2020-03-09 LAB — MAGNESIUM: Magnesium: 1.9 mg/dL (ref 1.7–2.4)

## 2020-03-09 LAB — PHOSPHORUS: Phosphorus: 4.1 mg/dL (ref 2.5–4.6)

## 2020-03-09 MED ORDER — FREE WATER
100.0000 mL | Freq: Every day | Status: DC
  Administered 2020-03-09 – 2020-03-12 (×16): 100 mL

## 2020-03-09 MED ORDER — EPHEDRINE 5 MG/ML INJ
INTRAVENOUS | Status: AC
  Filled 2020-03-09: qty 10

## 2020-03-09 MED ORDER — IPRATROPIUM-ALBUTEROL 0.5-2.5 (3) MG/3ML IN SOLN
3.0000 mL | Freq: Four times a day (QID) | RESPIRATORY_TRACT | Status: DC
  Administered 2020-03-09 – 2020-03-10 (×4): 3 mL via RESPIRATORY_TRACT
  Filled 2020-03-09 (×3): qty 3

## 2020-03-09 MED ORDER — SODIUM CHLORIDE 0.9 % IV SOLN
500.0000 mg | INTRAVENOUS | Status: DC
  Administered 2020-03-09 – 2020-03-11 (×3): 500 mg via INTRAVENOUS
  Filled 2020-03-09 (×4): qty 500

## 2020-03-09 MED ORDER — SODIUM CHLORIDE 0.9 % IV SOLN
2.0000 g | INTRAVENOUS | Status: DC
  Administered 2020-03-10 – 2020-03-12 (×3): 2 g via INTRAVENOUS
  Filled 2020-03-09 (×3): qty 2
  Filled 2020-03-09: qty 20

## 2020-03-09 MED ORDER — IPRATROPIUM-ALBUTEROL 0.5-2.5 (3) MG/3ML IN SOLN
RESPIRATORY_TRACT | Status: AC
  Administered 2020-03-09: 3 mL via RESPIRATORY_TRACT
  Filled 2020-03-09: qty 3

## 2020-03-09 MED ORDER — OSMOLITE 1.5 CAL PO LIQD
120.0000 mL | Freq: Every day | ORAL | Status: DC
  Administered 2020-03-09 – 2020-03-10 (×5): 120 mL
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
## 2020-03-09 NOTE — Progress Notes (Signed)
Patient ID: Andrew Pratt, male   DOB: 1955-07-29, 64 y.o.   MRN: [id number] [facility] PROGRESS NOTE  Andrew Pratt WNU:[id number] DOB: [birth year] DOA: 03/05/2020 PCP: Patient, No Pcp Per  HPI/Subjective: Patient feeling okay.  Had a little soreness in his abdomen after the procedure.  Was having trouble swallowing since July.  Admitted after endoscopy showing esophageal mass and difficulty swallowing.  Objective: Vitals:   03/09/20 0746 03/09/20 1235  BP: 128/77 137/82  Pulse: 70 63  Resp: 20 20  Temp: (!) 97.4 F (36.3 C) 97.8 F (36.6 C)  SpO2: 98% 100%    Intake/Output Summary (Last 24 hours) at 03/09/2020 1420 Last data filed at 03/09/2020 1045 Gross per 24 hour  Intake 1000 ml  Output 660 ml  Net 340 ml   Filed Weights   03/05/20 0905  Weight: 44.9 kg    ROS: Review of Systems  Respiratory: Negative for cough and shortness of breath.   Cardiovascular: Negative for chest pain.  Gastrointestinal: Positive for abdominal pain. Negative for nausea and vomiting.   Exam: Physical Exam Pulmonary:     Breath sounds: Examination of the right-lower field reveals decreased breath sounds and wheezing. Examination of the left-lower field reveals decreased breath sounds and wheezing. Decreased breath sounds and wheezing present. No rhonchi or rales.  Abdominal:     Palpations: Abdomen is soft.     Tenderness: There is abdominal tenderness.  Musculoskeletal:     Right lower leg: No swelling.     Left lower leg: No swelling.  Skin:    General: Skin is warm.     Findings: No rash.  Neurological:     Mental Status: He is alert and oriented to person, place, and time.       Data Reviewed: Basic Metabolic Panel: Recent Labs  Lab 03/05/20 0907 03/06/20 0521 03/06/20 2230 03/07/20 0600 03/08/20 0631 03/09/20 0612  NA 143  --  144 144 141 138  K 3.3*  --  3.3* 3.4* 3.8 4.2  CL 108  --  110 110 107 106  CO2 25  --  24 25 22 25   GLUCOSE 99  --  105* 84  79 186*  BUN 10  --  7* 9 17 20   CREATININE 0.74  --  0.76 0.65 0.65 0.63  CALCIUM 9.6  --  9.3 9.4 9.7 9.6  MG  --  2.1  --  2.0 2.0 1.9  PHOS  --   --   --  3.7 3.9 4.1   Liver Function Tests: Recent Labs  Lab 03/05/20 0907  AST 14*  ALT 9  ALKPHOS 94  BILITOT 0.7  PROT 8.3*  ALBUMIN 3.6   CBC: Recent Labs  Lab 03/05/20 0907 03/06/20 2230 03/07/20 0600  WBC 11.8* 18.0* 15.6*  NEUTROABS 8.4*  --   --   HGB 11.3* 10.0* 9.9*  HCT 37.3* 32.5* 31.8*  MCV 81.3 80.6 81.1  PLT 568* 472* 449*     Recent Results (from the past 240 hour(s))  SARS CORONAVIRUS 2 (TAT 6-24 HRS) Nasopharyngeal Nasopharyngeal Swab     Status: None   Collection Time: 03/01/20 11:37 AM   Specimen: Nasopharyngeal Swab  Result Value Ref Range Status   SARS Coronavirus 2 NEGATIVE NEGATIVE Final    Comment: (NOTE) SARS-CoV-2 target nucleic acids are NOT DETECTED.  The SARS-CoV-2 RNA is generally detectable in upper and lower respiratory specimens during the acute phase of infection. Negative results do not preclude SARS-CoV-2 infection,  do not rule out co-infections with other pathogens, and should not be used as the sole basis for treatment or other patient management decisions. Negative results must be combined with clinical observations, patient history, and epidemiological information. The expected result is Negative.  Fact Sheet for Patients: SugarRoll.be  Fact Sheet for Healthcare Providers: https://www.woods-mathews.com/  This test is not yet approved or cleared by the [place] FDA and  has been authorized for detection and/or diagnosis of SARS-CoV-2 by FDA under an Emergency Use Authorization (EUA). This EUA will remain  in effect (meaning this test can be used) for the duration of the COVID-19 declaration under Se ction 564(b)(1) of the Act, 21 U.S.C. section 360bbb-3(b)(1), unless the authorization is terminated or revoked  sooner.  Performed at [facility], [place] [address] [place], [place] [address]   Respiratory Panel by RT PCR (Flu A&B, Covid) - Nasopharyngeal Swab     Status: None   Collection Time: 03/05/20 12:55 PM   Specimen: Nasopharyngeal Swab  Result Value Ref Range Status   SARS Coronavirus 2 by RT PCR NEGATIVE NEGATIVE Final    Comment: (NOTE) SARS-CoV-2 target nucleic acids are NOT DETECTED.  The SARS-CoV-2 RNA is generally detectable in upper respiratoy specimens during the acute phase of infection. The lowest concentration of SARS-CoV-2 viral copies this assay can detect is 131 copies/mL. A negative result does not preclude SARS-Cov-2 infection and should not be used as the sole basis for treatment or other patient management decisions. A negative result may occur with  improper specimen collection/handling, submission of specimen other than nasopharyngeal swab, presence of viral mutation(s) within the areas targeted by this assay, and inadequate number of viral copies (<131 copies/mL). A negative result must be combined with clinical observations, patient history, and epidemiological information. The expected result is Negative.  Fact Sheet for Patients:  PinkCheek.be  Fact Sheet for Healthcare Providers:  GravelBags.it  This test is no t yet approved or cleared by the [place] FDA and  has been authorized for detection and/or diagnosis of SARS-CoV-2 by FDA under an Emergency Use Authorization (EUA). This EUA will remain  in effect (meaning this test can be used) for the duration of the COVID-19 declaration under Section 564(b)(1) of the Act, 21 U.S.C. section 360bbb-3(b)(1), unless the authorization is terminated or revoked sooner.     Influenza A by PCR NEGATIVE NEGATIVE Final   Influenza B by PCR NEGATIVE NEGATIVE Final    Comment: (NOTE) The Xpert Xpress SARS-CoV-2/FLU/RSV assay is intended as an aid in   the diagnosis of influenza from Nasopharyngeal swab specimens and  should not be used as a sole basis for treatment. Nasal washings and  aspirates are unacceptable for Xpert Xpress SARS-CoV-2/FLU/RSV  testing.  Fact Sheet for Patients: PinkCheek.be  Fact Sheet for Healthcare Providers: GravelBags.it  This test is not yet approved or cleared by the [place] FDA and  has been authorized for detection and/or diagnosis of SARS-CoV-2 by  FDA under an Emergency Use Authorization (EUA). This EUA will remain  in effect (meaning this test can be used) for the duration of the  Covid-19 declaration under Section 564(b)(1) of the Act, 21  U.S.C. section 360bbb-3(b)(1), unless the authorization is  terminated or revoked. Performed at [facility], [address] [place], [place] [address]      Studies: DG CHEST PORT 1 VIEW  Addendum Date: 03/08/2020   ADDENDUM REPORT: 03/08/2020 19:05 ADDENDUM: I was notified that the patient just had a  percutaneous gastrostomy placement with insufflation of the abdomen. The free air noted in the upper abdomen is secondary to recent procedure. Electronically Signed   By: Anner Crete M.D.   On: 03/08/2020 19:05   Result Date: 03/08/2020 CLINICAL DATA:  64 year old male status post Port-A-Cath placement. EXAM: PORTABLE CHEST 1 VIEW COMPARISON:  CT of the chest abdomen pelvis dated 03/06/2020. FINDINGS: Right-sided Port-A-Cath with tip at the cavoatrial junction. Right-sided PICC with tip over upper SVC. There are bilateral lucencies along the diaphragms which may represent sub pulmonic pneumothoraces versus pneumoperitoneum. Further evaluation with chest CT is recommended. Right lung base density may represent atelectasis or infiltrate. Faint densities noted in the left mid to lower lung field. There is no pleural effusion. The cardiac silhouette is within limits. No acute osseous  pathology. IMPRESSION: 1. Right-sided Port-A-Cath with tip over the upper SVC. 2. Bilateral pulmonary lucencies may represent sub pulmonic pneumothoraces versus pneumoperitoneum. Further evaluation with chest CT is recommended. 3. Right lung base density and left mid to lower lung field faint densities. These results were called by telephone at the time of interpretation on 03/08/2020 at 6:39 pm to Nurse Rodolph Bong, who verbally acknowledged these results. Electronically Signed: By: Anner Crete M.D. On: 03/08/2020 18:49   DG C-Arm 1-60 Min-No Report  Result Date: 03/08/2020 Fluoroscopy was utilized by the requesting physician.  No radiographic interpretation.    Scheduled Meds: . Chlorhexidine Gluconate Cloth  6 each Topical Daily  . feeding supplement (OSMOLITE 1.5 CAL)  120 mL Per Tube 5 X Daily  . free water  100 mL Per Tube 5 X Daily  . heparin  5,000 Units Subcutaneous Q8H  . nicotine  21 mg Transdermal Daily  . sodium chloride flush  10-40 mL Intracatheter Q12H   Continuous Infusions: . dextrose 5 % and 0.9% NaCl Stopped (03/06/20 1446)    Assessment/Plan:   1.  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  Patient had PEG placed yesterday.  Will need to follow-up with oncology as outpatient to set up plan.  Also had port placed yesterday. 2.  Severe protein calorie malnutrition starting tube feedings today.  Appreciate nutrition's consultation.  Hopefully will get rid of IV fluids tomorrow. 3.  Aspiration pneumonia seen on CT scan.  Start Rocephin and Zithromax.  Patient with lower wheeze.  Start DuoNeb nebulizer solution. 4.  Hypokalemia already replaced. 5.  Weakness get physical therapy evaluation    Code Status:     Code Status Orders  (From admission, onward)         Start     Ordered   03/06/20 2049  Full code  Continuous        03/06/20 2048        Code Status History    This patient has a current code status but no historical code  status.   Advance Care Planning Activity     Family Communication: Nephew and niece on the phone Disposition Plan: Status is: Inpatient  Dispo: The patient is from: Home              Anticipated d/c is to: Home              Anticipated d/c date is: 03/11/2020 at the earliest depending on how he does with tolerating tube feedings.              Patient currently being started on tube feedings this evening.  Consultants:  General surgery  Oncology  Procedures:  Port placement  PEG placement  Antibiotics:  Rocephin and Zithromax  Time spent: 28 minutes  [place]

## 2020-03-09 NOTE — TOC Progression Note (Signed)
Transition of Care [facility]) - Progression Note    Patient Details  Name: Andrew Pratt MRN: [id number] Date of Birth: 07-06-1955  Transition of [facility] CM/SW [place], LCSW Phone Number: 03/09/2020, 4:13 PM  Clinical Narrative:  Made charity home health RN, PT referral to [place] representative. Patient lives home with his niece and her husband. Representative spoke with patient's niece and determined patient would not qualify for charity due to household income. Received call from niece and she provided CSW with this information. They are very familiar with managing tube feeds due to other family members having them. Niece said patient had applied for Medicaid earlier this month and they anticipate him getting approved within 15 days due to them explaining patient's currently clinical situation to the person submitting the application.  Expected Discharge Plan: Home/Self Care Barriers to Discharge: Inadequate or no insurance, Continued Medical Work up  Expected Discharge Plan and Services Expected Discharge Plan: Home/Self Care       Living arrangements for the past 2 months: Single Family Home                 DME Arranged: Tube feeding DME Agency: [employer] Date DME Agency Contacted: 03/07/20   Representative spoke with at DME Agency: [place] Determinants of Health ([place]) Interventions    Readmission Risk Interventions No flowsheet data found.

## 2020-03-09 NOTE — Progress Notes (Signed)
[place] Hospital Day(s): 3.   Post op day(s): 1 Day Post-Op.   Interval History:  Patient seen and examined no acute events or new complaints overnight.  Patient reports he is feeling much better this morning He has some soreness at the g-tube site but this is mild No issues with port site No fever, chills, nausea, emesis Labs are reassuring this morning, renal function normal, no electrolyte derangements  G-tube has been to gravity since OR   Vital signs in last 24 hours: [min-max] current  Temp:  [97.4 F (36.3 C)-98.7 F (37.1 C)] 98 F (36.7 C) (10/13 0511) Pulse Rate:  [60-85] 63 (10/13 0511) Resp:  [13-28] 16 (10/13 0511) BP: (98-152)/(52-90) 139/90 (10/13 0511) SpO2:  [94 %-100 %] 97 % (10/13 0511)     Height: 5\' 1"  (947.6 cm) Weight: 44.9 kg BMI (Calculated): 18.72   Intake/Output last 2 shifts:  10/12 0701 - 10/13 0700 In: 1000 [I.V.:800; IV Piggyback:200] Out: 10 [Blood:10]   Physical Exam:  Constitutional: alert, cooperative and no distress  Respiratory: breathing non-labored at rest  Cardiovascular: regular rate and sinus rhythm  Gastrointestinal: Soft, non-tender, non-distended, no rebound/guarding. G-Tube in left upper quadrant, to gravtiy Integumentary: Port in right chest wall, site CDI with dermabond, no erythema  Labs:  CBC Latest Ref Rng & Units 03/07/2020 03/06/2020 03/05/2020  WBC 4.0 - 10.5 K/uL 15.6(H) 18.0(H) 11.8(H)  Hemoglobin 13.0 - 17.0 g/dL 9.9(L) 10.0(L) 11.3(L)  Hematocrit 39 - 52 % 31.8(L) 32.5(L) 37.3(L)  Platelets 150 - 400 K/uL 449(H) 472(H) 568(H)   CMP Latest Ref Rng & Units 03/09/2020 03/08/2020 03/07/2020  Glucose 70 - 99 mg/dL 186(H) 79 84  BUN 8 - 23 mg/dL 20 17 9   Creatinine 0.61 - 1.24 mg/dL 0.63 0.65 0.65  Sodium 135 - 145 mmol/L 138 141 144  Potassium 3.5 - 5.1 mmol/L 4.2 3.8 3.4(L)  Chloride 98 - 111 mmol/L 106 107 110  CO2 22 - 32 mmol/L 25 22 25   Calcium 8.9 - 10.3 mg/dL  9.6 9.7 9.4  Total Protein 6.5 - 8.1 g/dL - - -  Total Bilirubin 0.3 - 1.2 mg/dL - - -  Alkaline Phos 38 - 126 U/L - - -  AST 15 - 41 U/L - - -  ALT 0 - 44 U/L - - -    Imaging studies: No new pertinent imaging studies   Assessment/Plan: 64 y.o. male 1 Day Post-Op s/p port-a-catheter, excisional lymph node biopsy, and robotic assisted laparoscopic gastrostomy tube placement for obstructing distal esophageal mass consistent with squamous cell carcinoma.   - Okay to access g-tube today  - pain control prn  - monitor abdominal examination   - Mobilization as tolerable   - Further management per primary service; we will sign off but be available   All of the above findings and recommendations were discussed with the patient, and the medical team, and all of patient's questions were answered to his expressed satisfaction.  -- Edison Simon, PA-C [facility] 03/09/2020, 7:17 AM [phone] M-F: 7am - 4pm

## 2020-03-09 NOTE — Progress Notes (Signed)
Nutrition Follow- Up Note   DOCUMENTATION CODES:   Severe malnutrition in context of acute illness/injury  INTERVENTION:   Osmolite 1.5- 5 cans daily- Start with 1/2 can 5 times daily and increase as tolerated- Flush with 58ml of water before and after each tube feed  Regimen provides 1775kcal/day, 75g/day protein and 1430ml/day free water   Pt at high refeed risk; recommend monitor potassium, magnesium and phosphorus labs daily.   Consider iron supplementation after pt tolerating tube feeds  NUTRITION DIAGNOSIS:   Severe Malnutrition related to acute illness (new esophageal mass) as evidenced by 20 percent weight loss in 5 months, moderate to severe fat depletions, moderate to severe muscle depletions.  GOAL:   Patient will meet greater than or equal to 90% of their needs  -progressing with tube feeds  MONITOR:   Labs, Weight trends, TF tolerance, Skin, I & O's  REASON FOR ASSESSMENT:   Consult Enteral/tube feeding initiation and management  ASSESSMENT:   64 y.o. male with medical history significant of tobacco abuse, recently diagnosed metastatic squamous cell carcinoma (esophageal), who presents with difficulty swallowing.   Pt s/p surgical placed 18FR G-tube 10/12  OK per surgery to begin using G-tube today. Will advance bolus feeds slowly. Pt is at high refeed risk. Pt noted to have iron deficiency; would recommend supplementation once pt is tolerating tube feeds. No new weight since admit; will request weekly weights.    Medications reviewed and include: heparin, nicotine, cefazolin, NaCl w/ 5% dextrose @75ml /hr  Labs reviewed: K 4.2 wnl, P 4.1 wnl, Mg 1.9 wnl Iron 22(L), TIBC 291, ferritin 23(L), folate 15.3, B12 532- 10/12 Wbc- 15.6(H), Hgb 9.9(L), Hct 31.8(L)- 10/11  Diet Order:   Diet Order            Diet NPO time specified Except for: Ice Chips  Diet effective now                EDUCATION NEEDS:   Education needs have been addressed  Skin:   Skin Assessment: Reviewed RN Assessment  Last BM:  10/2  Height:   Ht Readings from Last 1 Encounters:  03/05/20 5\' 1"  (1.549 m)    Weight:   Wt Readings from Last 1 Encounters:  03/05/20 44.9 kg    Ideal Body Weight:  50.9 kg  BMI:  Body mass index is 18.71 kg/m.  Estimated Nutritional Needs:   Kcal:  1500-1700kcal/day  Protein:  75-85g/day  Fluid:  >/= 1.4 L/day  Koleen Distance MS, RD, LDN Please refer to [facility] for RD and/or RD on-call/weekend/after hours pager

## 2020-03-10 LAB — BASIC METABOLIC PANEL
Anion gap: 7 (ref 5–15)
BUN: 17 mg/dL (ref 8–23)
CO2: 27 mmol/L (ref 22–32)
Calcium: 8.7 mg/dL — ABNORMAL LOW (ref 8.9–10.3)
Chloride: 107 mmol/L (ref 98–111)
Creatinine, Ser: 0.66 mg/dL (ref 0.61–1.24)
GFR, Estimated: >60 mL/min (ref >60)
Glucose, Bld: 127 mg/dL — ABNORMAL HIGH (ref 70–99)
Potassium: 3.5 mmol/L (ref 3.5–5.1)
Sodium: 141 mmol/L (ref 135–145)

## 2020-03-10 LAB — PHOSPHORUS: Phosphorus: 1.5 mg/dL — ABNORMAL LOW (ref 2.5–4.6)

## 2020-03-10 LAB — MAGNESIUM: Magnesium: 2 mg/dL (ref 1.7–2.4)

## 2020-03-10 LAB — SURGICAL PATHOLOGY

## 2020-03-10 MED ORDER — OSMOLITE 1.5 CAL PO LIQD
180.0000 mL | Freq: Every day | ORAL | Status: DC
  Administered 2020-03-10 – 2020-03-11 (×4): 180 mL

## 2020-03-10 MED ORDER — POTASSIUM PHOSPHATES 15 MMOLE/5ML IV SOLN
30.0000 mmol | Freq: Once | INTRAVENOUS | Status: AC
  Administered 2020-03-10: 30 mmol via INTRAVENOUS
  Filled 2020-03-10: qty 10

## 2020-03-10 NOTE — Progress Notes (Signed)
Patient ID: Andrew Pratt, male   DOB: 1956/01/23, 64 y.o.   MRN: [id number] [facility] PROGRESS NOTE  Andrew Pratt GYK:[id number] DOB: 16-Apr-1956 DOA: 03/05/2020 PCP: Patient, No Pcp Per  HPI/Subjective: Patient does have a little soreness in his abdomen after the procedure.  Tolerating the half dose tube feeds okay.  No diarrhea.  Does have a little hoarse voice.  Objective: Vitals:   03/10/20 0738 03/10/20 0825  BP:  107/70  Pulse:  77  Resp:  20  Temp:    SpO2: 96% 97%    Intake/Output Summary (Last 24 hours) at 03/10/2020 1202 Last data filed at 03/10/2020 0727 Gross per 24 hour  Intake 1062.48 ml  Output 0 ml  Net 1062.48 ml   Filed Weights   03/05/20 0905  Weight: 44.9 kg    ROS: Review of Systems  Respiratory: Negative for cough and shortness of breath.   Cardiovascular: Negative for chest pain.  Gastrointestinal: Positive for abdominal pain. Negative for nausea and vomiting.   Exam: Physical Exam HENT:     Head: Normocephalic.     Mouth/Throat:     Pharynx: No oropharyngeal exudate.  Eyes:     General: Lids are normal.     Conjunctiva/sclera: Conjunctivae normal.     Pupils: Pupils are equal, round, and reactive to light.  Cardiovascular:     Rate and Rhythm: Normal rate and regular rhythm.     Heart sounds: Normal heart sounds, S1 normal and S2 normal.  Pulmonary:     Breath sounds: No decreased breath sounds, wheezing, rhonchi or rales.  Abdominal:     Palpations: Abdomen is soft.     Tenderness: There is no abdominal tenderness.  Musculoskeletal:     Right lower leg: No swelling.     Left lower leg: No swelling.  Skin:    General: Skin is warm.     Findings: No lesion.  Neurological:     Mental Status: He is alert and oriented to person, place, and time.       Data Reviewed: Basic Metabolic Panel: Recent Labs  Lab 03/05/20 0907 03/06/20 0521 03/06/20 2230 03/07/20 0600 03/08/20 0631 03/09/20 0612 03/10/20 0555  NA    < >  --  144 144 141 138 141  K   < >  --  3.3* 3.4* 3.8 4.2 3.5  CL   < >  --  110 110 107 106 107  CO2   < >  --  24 25 22 25 27   GLUCOSE   < >  --  105* 84 79 186* 127*  BUN   < >  --  7* 9 17 20 17   CREATININE   < >  --  0.76 0.65 0.65 0.63 0.66  CALCIUM   < >  --  9.3 9.4 9.7 9.6 8.7*  MG  --  2.1  --  2.0 2.0 1.9 2.0  PHOS  --   --   --  3.7 3.9 4.1 1.5*   < > = values in this interval not displayed.   Liver Function Tests: Recent Labs  Lab 03/05/20 0907  AST 14*  ALT 9  ALKPHOS 94  BILITOT 0.7  PROT 8.3*  ALBUMIN 3.6   CBC: Recent Labs  Lab 03/05/20 0907 03/06/20 2230 03/07/20 0600  WBC 11.8* 18.0* 15.6*  NEUTROABS 8.4*  --   --   HGB 11.3* 10.0* 9.9*  HCT 37.3* 32.5* 31.8*  MCV 81.3 80.6 81.1  PLT 568* 472* 449*     Recent Results (from the past 240 hour(s))  SARS CORONAVIRUS 2 (TAT 6-24 HRS) Nasopharyngeal Nasopharyngeal Swab     Status: None   Collection Time: 03/01/20 11:37 AM   Specimen: Nasopharyngeal Swab  Result Value Ref Range Status   SARS Coronavirus 2 NEGATIVE NEGATIVE Final    Comment: (NOTE) SARS-CoV-2 target nucleic acids are NOT DETECTED.  The SARS-CoV-2 RNA is generally detectable in upper and lower respiratory specimens during the acute phase of infection. Negative results do not preclude SARS-CoV-2 infection, do not rule out co-infections with other pathogens, and should not be used as the sole basis for treatment or other patient management decisions. Negative results must be combined with clinical observations, patient history, and epidemiological information. The expected result is Negative.  Fact Sheet for Patients: SugarRoll.be  Fact Sheet for Healthcare Providers: https://www.woods-mathews.com/  This test is not yet approved or cleared by the [place] FDA and  has been authorized for detection and/or diagnosis of SARS-CoV-2 by FDA under an Emergency Use Authorization (EUA).  This EUA will remain  in effect (meaning this test can be used) for the duration of the COVID-19 declaration under Se ction 564(b)(1) of the Act, 21 U.S.C. section 360bbb-3(b)(1), unless the authorization is terminated or revoked sooner.  Performed at [facility], [place] [address], [place], [place] [address]   Respiratory Panel by RT PCR (Flu A&B, Covid) - Nasopharyngeal Swab     Status: None   Collection Time: 03/05/20 12:55 PM   Specimen: Nasopharyngeal Swab  Result Value Ref Range Status   SARS Coronavirus 2 by RT PCR NEGATIVE NEGATIVE Final    Comment: (NOTE) SARS-CoV-2 target nucleic acids are NOT DETECTED.  The SARS-CoV-2 RNA is generally detectable in upper respiratoy specimens during the acute phase of infection. The lowest concentration of SARS-CoV-2 viral copies this assay can detect is 131 copies/mL. A negative result does not preclude SARS-Cov-2 infection and should not be used as the sole basis for treatment or other patient management decisions. A negative result may occur with  improper specimen collection/handling, submission of specimen other than nasopharyngeal swab, presence of viral mutation(s) within the areas targeted by this assay, and inadequate number of viral copies (<131 copies/mL). A negative result must be combined with clinical observations, patient history, and epidemiological information. The expected result is Negative.  Fact Sheet for Patients:  PinkCheek.be  Fact Sheet for Healthcare Providers:  GravelBags.it  This test is no t yet approved or cleared by the [place] FDA and  has been authorized for detection and/or diagnosis of SARS-CoV-2 by FDA under an Emergency Use Authorization (EUA). This EUA will remain  in effect (meaning this test can be used) for the duration of the COVID-19 declaration under Section 564(b)(1) of the Act, 21 U.S.C. section 360bbb-3(b)(1), unless the  authorization is terminated or revoked sooner.     Influenza A by PCR NEGATIVE NEGATIVE Final   Influenza B by PCR NEGATIVE NEGATIVE Final    Comment: (NOTE) The Xpert Xpress SARS-CoV-2/FLU/RSV assay is intended as an aid in  the diagnosis of influenza from Nasopharyngeal swab specimens and  should not be used as a sole basis for treatment. Nasal washings and  aspirates are unacceptable for Xpert Xpress SARS-CoV-2/FLU/RSV  testing.  Fact Sheet for Patients: PinkCheek.be  Fact Sheet for Healthcare Providers: GravelBags.it  This test is not yet approved or cleared by the [place] FDA and  has been authorized for detection and/or  diagnosis of SARS-CoV-2 by  FDA under an Emergency Use Authorization (EUA). This EUA will remain  in effect (meaning this test can be used) for the duration of the  Covid-19 declaration under Section 564(b)(1) of the Act, 21  U.S.C. section 360bbb-3(b)(1), unless the authorization is  terminated or revoked. Performed at [facility], [address] [place], [place] [address]      Studies: DG CHEST PORT 1 VIEW  Addendum Date: 03/08/2020   ADDENDUM REPORT: 03/08/2020 19:05 ADDENDUM: I was notified that the patient just had a percutaneous gastrostomy placement with insufflation of the abdomen. The free air noted in the upper abdomen is secondary to recent procedure. Electronically Signed   By: Anner Crete M.D.   On: 03/08/2020 19:05   Result Date: 03/08/2020 CLINICAL DATA:  65 year old male status post Port-A-Cath placement. EXAM: PORTABLE CHEST 1 VIEW COMPARISON:  CT of the chest abdomen pelvis dated 03/06/2020. FINDINGS: Right-sided Port-A-Cath with tip at the cavoatrial junction. Right-sided PICC with tip over upper SVC. There are bilateral lucencies along the diaphragms which may represent sub pulmonic pneumothoraces versus pneumoperitoneum. Further evaluation with chest CT is  recommended. Right lung base density may represent atelectasis or infiltrate. Faint densities noted in the left mid to lower lung field. There is no pleural effusion. The cardiac silhouette is within limits. No acute osseous pathology. IMPRESSION: 1. Right-sided Port-A-Cath with tip over the upper SVC. 2. Bilateral pulmonary lucencies may represent sub pulmonic pneumothoraces versus pneumoperitoneum. Further evaluation with chest CT is recommended. 3. Right lung base density and left mid to lower lung field faint densities. These results were called by telephone at the time of interpretation on 03/08/2020 at 6:39 pm to Nurse Rodolph Bong, who verbally acknowledged these results. Electronically Signed: By: Anner Crete M.D. On: 03/08/2020 18:49   DG C-Arm 1-60 Min-No Report  Result Date: 03/08/2020 Fluoroscopy was utilized by the requesting physician.  No radiographic interpretation.    Scheduled Meds: . Chlorhexidine Gluconate Cloth  6 each Topical Daily  . feeding supplement (OSMOLITE 1.5 CAL)  120 mL Per Tube 5 X Daily  . free water  100 mL Per Tube 5 X Daily  . heparin  5,000 Units Subcutaneous Q8H  . ipratropium-albuterol  3 mL Nebulization Q6H  . nicotine  21 mg Transdermal Daily  . sodium chloride flush  10-40 mL Intracatheter Q12H   Continuous Infusions: . azithromycin 500 mg (03/09/20 1713)  . cefTRIAXone (ROCEPHIN)  IV Stopped (03/10/20 0610)  . potassium PHOSPHATE IVPB (in mmol) 30 mmol (03/10/20 0914)    Assessment/Plan:  1. 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  PEG placed on 03/08/2020.  Patient was on half goal feeding today and last night.  Hopefully will advance to full dose PEG feeding today.  Oncology follow-up as outpatient. 2. Severe protein calorie malnutrition.  Continue tube feedings. 3. Hypophosphatemia replace phosphorus with K-Phos today.  Recheck electrolytes tomorrow. 4. Aspiration pneumonia seen on CT scan.  Continue Rocephin  and Zithromax.  Lungs sound better today after DuoNeb started yesterday. 5. Hypokalemia replacing with K-Phos today 6. Weakness.  Did well with physical therapy recommends home with home health.     Code Status:     Code Status Orders  (From admission, onward)         Start     Ordered   03/06/20 2049  Full code  Continuous        03/06/20 2048  Code Status History    This patient has a current code status but no historical code status.   Advance Care Planning Activity     Family Communication: Spoke with nephew on the phone Disposition Plan: Status is: Inpatient  Dispo: The patient is from: Home              Anticipated d/c is to: Home with home health              Anticipated d/c date is: Potential 03/11/2020 if tolerating full dose PEG feeding versus 03/12/2020              Patient currently on half dose PEG feeding today.  Hopefully dietitian will increase to full dose PEG feeding today.  Replacing with K-Phos IV today and rechecking electrolytes again tomorrow.  Consultants:  General surgery  Oncology  Time spent: 28 minutes  [place]

## 2020-03-10 NOTE — Progress Notes (Addendum)
Patient c/o nausea,pain to G tube site, and spitting up excessively today. PRN pain med given. Per niece, he had this sensation before he came to the hospital. It stopped then,  it came back after g-tube placement and tube feeding.   Keisha [phone])  (niece )was educated on tube feedings today at bedside. She feel good about it. Return demonstration observed. Patient is not interested to learn the task.  So please call [facility] for any discharge plan.

## 2020-03-11 LAB — CBC
HCT: 29.8 % — ABNORMAL LOW (ref 39.0–52.0)
Hemoglobin: 9.3 g/dL — ABNORMAL LOW (ref 13.0–17.0)
MCH: 25 pg — ABNORMAL LOW (ref 26.0–34.0)
MCHC: 31.2 g/dL (ref 30.0–36.0)
MCV: 80.1 fL (ref 80.0–100.0)
Platelets: 426 10*3/uL — ABNORMAL HIGH (ref 150–400)
RBC: 3.72 MIL/uL — ABNORMAL LOW (ref 4.22–5.81)
RDW: 18.7 % — ABNORMAL HIGH (ref 11.5–15.5)
WBC: 12.5 10*3/uL — ABNORMAL HIGH (ref 4.0–10.5)
nRBC: 0 % (ref 0.0–0.2)

## 2020-03-11 LAB — PHOSPHORUS: Phosphorus: 3 mg/dL (ref 2.5–4.6)

## 2020-03-11 LAB — BASIC METABOLIC PANEL
Anion gap: 7 (ref 5–15)
BUN: 11 mg/dL (ref 8–23)
CO2: 28 mmol/L (ref 22–32)
Calcium: 8.6 mg/dL — ABNORMAL LOW (ref 8.9–10.3)
Chloride: 107 mmol/L (ref 98–111)
Creatinine, Ser: 0.52 mg/dL — ABNORMAL LOW (ref 0.61–1.24)
GFR, Estimated: >60 mL/min (ref >60)
Glucose, Bld: 113 mg/dL — ABNORMAL HIGH (ref 70–99)
Potassium: 3.7 mmol/L (ref 3.5–5.1)
Sodium: 142 mmol/L (ref 135–145)

## 2020-03-11 LAB — MAGNESIUM: Magnesium: 1.9 mg/dL (ref 1.7–2.4)

## 2020-03-11 MED ORDER — METOCLOPRAMIDE HCL 5 MG/ML IJ SOLN
5.0000 mg | Freq: Four times a day (QID) | INTRAMUSCULAR | Status: DC
  Administered 2020-03-11 – 2020-03-12 (×5): 5 mg via INTRAVENOUS
  Filled 2020-03-11 (×5): qty 2

## 2020-03-11 MED ORDER — IPRATROPIUM-ALBUTEROL 0.5-2.5 (3) MG/3ML IN SOLN
3.0000 mL | Freq: Three times a day (TID) | RESPIRATORY_TRACT | Status: DC
  Administered 2020-03-11 – 2020-03-12 (×3): 3 mL via RESPIRATORY_TRACT
  Filled 2020-03-11 (×4): qty 3

## 2020-03-11 MED ORDER — OSMOLITE 1.5 CAL PO LIQD
237.0000 mL | Freq: Every day | ORAL | Status: DC
  Administered 2020-03-11 (×3): 120 mL
  Administered 2020-03-11: 237 mL
  Administered 2020-03-12 (×2): 120 mL
  Administered 2020-03-12: 117 mL

## 2020-03-11 MED ORDER — INFLUENZA VAC SPLIT QUAD 0.5 ML IM SUSY
0.5000 mL | PREFILLED_SYRINGE | INTRAMUSCULAR | Status: AC
  Administered 2020-03-12: 0.5 mL via INTRAMUSCULAR

## 2020-03-11 NOTE — Progress Notes (Signed)
Appointments for both medical and radiation oncology made for Monday 03/14/20  Dr. Randa Evens, MD, MPH [place] at [facility] Pager- [phone] 03/11/2020 12:40 PM

## 2020-03-11 NOTE — TOC Progression Note (Signed)
Transition of Care [facility]) - Progression Note    Patient Details  Name: BLAYDEN CONWELL MRN: [id number] Date of Birth: [birth year]  Transition of Care [facility]) CM/SW [place], LCSW Phone Number: 03/11/2020, 2:44 PM  Clinical Narrative: Tube feeds will be delivered to patient's home today.    Expected Discharge Plan: Home/Self Care Barriers to Discharge: Inadequate or no insurance, Continued Medical Work up  Expected Discharge Plan and Services Expected Discharge Plan: Home/Self Care       Living arrangements for the past 2 months: Single Family Home                 DME Arranged: Tube feeding DME Agency: [employer] Date DME Agency Contacted: 03/07/20   Representative spoke with at DME Agency: [place] Determinants of Health ([place]) Interventions    Readmission Risk Interventions No flowsheet data found.

## 2020-03-11 NOTE — Plan of Care (Signed)
Continuing with plan of care. 

## 2020-03-11 NOTE — Progress Notes (Signed)
Patient ID: Andrew Pratt, male   DOB: 1955/07/02, 64 y.o.   MRN: [id number] [facility] PROGRESS NOTE  Andrew Pratt PJA:[id number] DOB: 12-30-1955 DOA: 03/05/2020 PCP: Patient, No Pcp Per  HPI/Subjective: Patient feels okay. Has soreness in his abdomen. Not any worse than its been since the procedure. Still with some cough.  Objective: Vitals:   03/11/20 0807 03/11/20 1138  BP: 105/67 104/65  Pulse: 74 82  Resp: 14   Temp: 99.3 F (37.4 C) 98.4 F (36.9 C)  SpO2: 95% 98%    Intake/Output Summary (Last 24 hours) at 03/11/2020 1216 Last data filed at 03/11/2020 0938 Gross per 24 hour  Intake 237 ml  Output 500 ml  Net -263 ml   Filed Weights   03/05/20 0905  Weight: 44.9 kg    ROS: Review of Systems  Respiratory: Positive for cough. Negative for shortness of breath.   Cardiovascular: Negative for chest pain.  Gastrointestinal: Positive for abdominal pain and nausea. Negative for constipation, diarrhea and vomiting.   Exam: Physical Exam HENT:     Head: Normocephalic.     Mouth/Throat:     Pharynx: No oropharyngeal exudate.  Eyes:     General: Lids are normal.     Conjunctiva/sclera: Conjunctivae normal.  Cardiovascular:     Rate and Rhythm: Normal rate and regular rhythm.     Heart sounds: Normal heart sounds, S1 normal and S2 normal.  Pulmonary:     Breath sounds: Examination of the right-lower field reveals decreased breath sounds and rhonchi. Examination of the left-lower field reveals decreased breath sounds and rhonchi. Decreased breath sounds and rhonchi present. No wheezing or rales.  Abdominal:     Palpations: Abdomen is soft.     Tenderness: There is no abdominal tenderness.  Musculoskeletal:     Right lower leg: No swelling.     Left lower leg: No swelling.  Skin:    General: Skin is warm.     Findings: No lesion.  Neurological:     Mental Status: He is alert and oriented to person, place, and time.       Data Reviewed: Basic  Metabolic Panel: Recent Labs  Lab 03/07/20 0600 03/08/20 0631 03/09/20 0612 03/10/20 0555 03/11/20 0440  NA 144 141 138 141 142  K 3.4* 3.8 4.2 3.5 3.7  CL 110 107 106 107 107  CO2 25 22 25 27 28   GLUCOSE 84 79 186* 127* 113*  BUN 9 17 20 17 11   CREATININE 0.65 0.65 0.63 0.66 0.52*  CALCIUM 9.4 9.7 9.6 8.7* 8.6*  MG 2.0 2.0 1.9 2.0 1.9  PHOS 3.7 3.9 4.1 1.5* 3.0   Liver Function Tests: Recent Labs  Lab 03/05/20 0907  AST 14*  ALT 9  ALKPHOS 94  BILITOT 0.7  PROT 8.3*  ALBUMIN 3.6   CBC: Recent Labs  Lab 03/05/20 0907 03/06/20 2230 03/07/20 0600 03/11/20 0814  WBC 11.8* 18.0* 15.6* 12.5*  NEUTROABS 8.4*  --   --   --   HGB 11.3* 10.0* 9.9* 9.3*  HCT 37.3* 32.5* 31.8* 29.8*  MCV 81.3 80.6 81.1 80.1  PLT 568* 472* 449* 426*     Recent Results (from the past 240 hour(s))  Respiratory Panel by RT PCR (Flu A&B, Covid) - Nasopharyngeal Swab     Status: None   Collection Time: 03/05/20 12:55 PM   Specimen: Nasopharyngeal Swab  Result Value Ref Range Status   SARS Coronavirus 2 by RT PCR NEGATIVE NEGATIVE Final  Comment: (NOTE) SARS-CoV-2 target nucleic acids are NOT DETECTED.  The SARS-CoV-2 RNA is generally detectable in upper respiratoy specimens during the acute phase of infection. The lowest concentration of SARS-CoV-2 viral copies this assay can detect is 131 copies/mL. A negative result does not preclude SARS-Cov-2 infection and should not be used as the sole basis for treatment or other patient management decisions. A negative result may occur with  improper specimen collection/handling, submission of specimen other than nasopharyngeal swab, presence of viral mutation(s) within the areas targeted by this assay, and inadequate number of viral copies (<131 copies/mL). A negative result must be combined with clinical observations, patient history, and epidemiological information. The expected result is Negative.  Fact Sheet for Patients:   PinkCheek.be  Fact Sheet for Healthcare Providers:  GravelBags.it  This test is no t yet approved or cleared by the [place] FDA and  has been authorized for detection and/or diagnosis of SARS-CoV-2 by FDA under an Emergency Use Authorization (EUA). This EUA will remain  in effect (meaning this test can be used) for the duration of the COVID-19 declaration under Section 564(b)(1) of the Act, 21 U.S.C. section 360bbb-3(b)(1), unless the authorization is terminated or revoked sooner.     Influenza A by PCR NEGATIVE NEGATIVE Final   Influenza B by PCR NEGATIVE NEGATIVE Final    Comment: (NOTE) The Xpert Xpress SARS-CoV-2/FLU/RSV assay is intended as an aid in  the diagnosis of influenza from Nasopharyngeal swab specimens and  should not be used as a sole basis for treatment. Nasal washings and  aspirates are unacceptable for Xpert Xpress SARS-CoV-2/FLU/RSV  testing.  Fact Sheet for Patients: PinkCheek.be  Fact Sheet for Healthcare Providers: GravelBags.it  This test is not yet approved or cleared by the [place] FDA and  has been authorized for detection and/or diagnosis of SARS-CoV-2 by  FDA under an Emergency Use Authorization (EUA). This EUA will remain  in effect (meaning this test can be used) for the duration of the  Covid-19 declaration under Section 564(b)(1) of the Act, 21  U.S.C. section 360bbb-3(b)(1), unless the authorization is  terminated or revoked. Performed at [facility], [address] [place], [place] [address]       Scheduled Meds: . Chlorhexidine Gluconate Cloth  6 each Topical Daily  . feeding supplement (OSMOLITE 1.5 CAL)  237 mL Per Tube 5 X Daily  . free water  100 mL Per Tube 5 X Daily  . heparin  5,000 Units Subcutaneous Q8H  . ipratropium-albuterol  3 mL Nebulization TID  . nicotine  21 mg Transdermal Daily   . sodium chloride flush  10-40 mL Intracatheter Q12H   Continuous Infusions: . azithromycin 500 mg (03/10/20 1704)  . cefTRIAXone (ROCEPHIN)  IV 2 g (03/11/20 0449)    Assessment/Plan:  1. 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 PEG tube placed on 03/08/2020. Patient having some abdominal soreness since the procedure. Not worse than previous. Patient advanced to full dose PEG feeding today. We will watch today in the hospital make sure he tolerates goal rate tube feedings today. Will need oncology as outpatient for palliative chemotherapy. 2. Severe protein calorie nutrition. Continue tube feeds just got up to goal rate today. 3. Hypophosphatemia, hypokalemia electrolytes replaced yesterday and in normal range today. 4. Aspiration pneumonia seen on CT scan. On Rocephin and Zithromax. Giving DuoNeb nebulizer solution while here. Will give albuterol inhaler upon disposition. 5. Weakness. Physical therapy recommends home with home health  Code Status:     Code Status Orders  (From admission, onward)         Start     Ordered   03/06/20 2049  Full code  Continuous        03/06/20 2048        Code Status History    This patient has a current code status but no historical code status.   Advance Care Planning Activity     Family Communication: Spoke with nephew on the phone Disposition Plan: Status is: Inpatient   Dispo: The patient is from: Home              Anticipated d/c is to: Home              Anticipated d/c date is: Potential 03/12/2020              Patient currently just got up to goal rate today on tube feedings. Patient also with more coughing and lungs. Continue IV antibiotics for pneumonia.  Antibiotics:  Rocephin  Zithromax  Time spent: 27 minutes  [place]

## 2020-03-11 NOTE — Progress Notes (Signed)
PT Cancellation Note  Patient Details Name: Andrew Pratt MRN: [id number] DOB: 12-26-1955   Cancelled Treatment:     PT attempt. Pt was long sitting in bed holding emesis bag in hand and pt spitting up. He reports having pain  in access site and states," I'm not up for it today, I just don't feel well." Acute PT will continue to follow per POC and return at later time/date when pt is more appropriate.    [place] Pa 03/11/2020, 2:46 PM

## 2020-03-12 ENCOUNTER — Inpatient Hospital Stay: Payer: Medicaid Other

## 2020-03-12 LAB — BASIC METABOLIC PANEL
Anion gap: 8 (ref 5–15)
BUN: 12 mg/dL (ref 8–23)
CO2: 27 mmol/L (ref 22–32)
Calcium: 9 mg/dL (ref 8.9–10.3)
Chloride: 104 mmol/L (ref 98–111)
Creatinine, Ser: 0.58 mg/dL — ABNORMAL LOW (ref 0.61–1.24)
GFR, Estimated: >60 mL/min (ref >60)
Glucose, Bld: 103 mg/dL — ABNORMAL HIGH (ref 70–99)
Potassium: 3.7 mmol/L (ref 3.5–5.1)
Sodium: 139 mmol/L (ref 135–145)

## 2020-03-12 LAB — CBC
HCT: 29.7 % — ABNORMAL LOW (ref 39.0–52.0)
Hemoglobin: 9.3 g/dL — ABNORMAL LOW (ref 13.0–17.0)
MCH: 25.2 pg — ABNORMAL LOW (ref 26.0–34.0)
MCHC: 31.3 g/dL (ref 30.0–36.0)
MCV: 80.5 fL (ref 80.0–100.0)
Platelets: 398 10*3/uL (ref 150–400)
RBC: 3.69 MIL/uL — ABNORMAL LOW (ref 4.22–5.81)
RDW: 18.9 % — ABNORMAL HIGH (ref 11.5–15.5)
WBC: 12.5 10*3/uL — ABNORMAL HIGH (ref 4.0–10.5)
nRBC: 0 % (ref 0.0–0.2)

## 2020-03-12 MED ORDER — NICOTINE 21 MG/24HR TD PT24
MEDICATED_PATCH | TRANSDERMAL | 0 refills | Status: DC
Start: 2020-03-12 — End: 2020-06-19

## 2020-03-12 MED ORDER — OSMOLITE 1.5 CAL PO LIQD: 237.0000 mL | Freq: Every day | ORAL | 0 refills | Status: DC

## 2020-03-12 MED ORDER — METOCLOPRAMIDE HCL 5 MG/5ML PO SOLN: 5.0000 mg | Freq: Three times a day (TID) | ORAL | 0 refills | Status: DC

## 2020-03-12 MED ORDER — ALBUTEROL SULFATE HFA 108 (90 BASE) MCG/ACT IN AERS: 2.0000 | INHALATION_SPRAY | Freq: Four times a day (QID) | RESPIRATORY_TRACT | 0 refills | Status: DC | PRN

## 2020-03-12 MED ORDER — MORPHINE SULFATE (CONCENTRATE) 10 MG/0.5ML PO SOLN: 5.0000 mg | ORAL | 0 refills | Status: DC | PRN

## 2020-03-12 MED ORDER — MORPHINE SULFATE (CONCENTRATE) 10 MG/0.5ML PO SOLN: 5.0000 mg | ORAL | Status: DC | PRN

## 2020-03-12 MED ORDER — AMOXICILLIN-POT CLAVULANATE 250-62.5 MG/5ML PO SUSR
500.0000 mg | Freq: Two times a day (BID) | ORAL | 0 refills | Status: AC
Start: 2020-03-12 — End: 2020-03-15

## 2020-03-12 MED ORDER — FREE WATER: 100.0000 mL | Freq: Every day | 0 refills | Status: DC

## 2020-03-12 MED ORDER — ONDANSETRON 4 MG PO TBDP: 4.0000 mg | ORAL_TABLET | Freq: Three times a day (TID) | ORAL | 0 refills | Status: DC | PRN

## 2020-03-12 MED ORDER — ACETAMINOPHEN 325 MG PO TABS: 650.0000 mg | ORAL_TABLET | Freq: Four times a day (QID) | ORAL | Status: DC | PRN

## 2020-03-12 MED ORDER — SODIUM CHLORIDE 0.9 % IV SOLN
INTRAVENOUS | Status: DC | PRN
  Administered 2020-03-12: 500 mL via INTRAVENOUS

## 2020-03-12 NOTE — Discharge Summary (Signed)
Andrew Pratt at [place] NAME: Andrew Pratt    MR#:  [id number]  DATE OF BIRTH:  11/21/1955  DATE OF ADMISSION:  03/05/2020 ADMITTING PHYSICIAN: Andrew Slocumb, DO  DATE OF DISCHARGE: 03/12/2020  PRIMARY CARE PHYSICIAN: Dr Andrew Pratt   ADMISSION DIAGNOSIS:  Esophageal obstruction [K22.2] Difficulty swallowing [R13.10]  DISCHARGE DIAGNOSIS:  Principal Problem:   Difficulty swallowing Active Problems:   Malignant neoplasm of lower third of esophagus (HCC)   Hypokalemia   Leukocytosis   Tobacco abuse   Esophageal obstruction   Primary cancer of esophagus with metastasis to other site Andrew Pratt)   Goals of care, counseling/discussion   Normocytic anemia   Protein-calorie malnutrition, severe   Aspiration pneumonia of both lower lobes (HCC)   Weakness   Hypophosphatemia   SECONDARY DIAGNOSIS:   Past Medical History:  Diagnosis Date   Cancer ([place])     HOSPITAL COURSE:   1.  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  EGD was done on 03/02/2020 the patient had a PEG tube placed on 03/08/2020 by general surgery.  Neck lymph node positive for squamous cell carcinoma.  The patient is feeling better today on the day of discharge than the day before.  The patient does better with half can tube feedings so we will have to do that 10 times a day.  Continue free water via the PEG.  Patient has a follow-up appointment on Monday with Dr. Janese Pratt and also radiation oncology.  Chemotherapy will be palliative in nature. 2.  Severe protein calorie malnutrition.  Continue tube feedings.  Goal rate is 5 cans/day.  He seems to tolerate half can at a time better, so will have to do 10 times a day half can.  Reglan prescribed. 3.  Hypophosphatemia, hypokalemia.  Electrolytes replaced during the hospital stay. 4.  Aspiration pneumonia seen on CT scan.  The patient was given Rocephin and Zithromax while here.  I will prescribe Augmentin for  3 days upon disposition home.  Continue albuterol inhaler upon discharge home. 5.  Weakness.  Physical therapy recommends home health.  DISCHARGE CONDITIONS:   fair  CONSULTS OBTAINED:  Treatment Team:  Andrew Guadeloupe, MD  DRUG ALLERGIES:  No Known Allergies  DISCHARGE MEDICATIONS:   Allergies as of 03/12/2020   No Known Allergies     Medication List    TAKE these medications   acetaminophen 325 MG tablet Commonly known as: TYLENOL Place 2 tablets (650 mg total) into feeding tube every 6 (six) hours as needed for mild pain (or Fever >/= 101).   albuterol 108 (90 Base) MCG/ACT inhaler Commonly known as: VENTOLIN HFA Inhale 2 puffs into the lungs every 6 (six) hours as needed for wheezing or shortness of breath.   amoxicillin-clavulanate 250-62.5 MG/5ML suspension Commonly known as: AUGMENTIN Place 10 mLs (500 mg total) into feeding tube 2 (two) times daily for 3 days.   feeding supplement (OSMOLITE 1.5 CAL) Liqd Place 237 mLs into feeding tube 5 (five) times daily. Notes to patient: Currently at half the feed every ten times a day.   free water Soln Place 100 mLs into feeding tube 5 (five) times daily.   metoCLOPramide 5 MG/5ML solution Commonly known as: REGLAN Take 5 mLs (5 mg total) by mouth 4 (four) times daily -  before meals and at bedtime.   morphine CONCENTRATE 10 MG/0.5ML Soln concentrated solution Take 0.25 mLs (5 mg total) by mouth every 4 (four)  hours as needed for severe pain.   nicotine 21 mg/24hr patch Commonly known as: NICODERM CQ - dosed in mg/24 hours One 21mg  patch chest wall daily (okay to substitute generic)   ondansetron 4 MG disintegrating tablet Commonly known as: Zofran ODT Take 1 tablet (4 mg total) by mouth every 8 (eight) hours as needed for nausea or vomiting.        DISCHARGE INSTRUCTIONS:   Follow-up with Dr. Janese Pratt on Monday 9 AM.  If you experience worsening of your admission symptoms, develop shortness of breath, life  threatening emergency, suicidal or homicidal thoughts you must seek medical attention immediately by calling 911 or calling your MD immediately  if symptoms less severe.  You Must read complete instructions/literature along with all the possible adverse reactions/side effects for all the Medicines you take and that have been prescribed to you. Take any new Medicines after you have completely understood and accept all the possible adverse reactions/side effects.   Please note  You were cared for by a hospitalist during your hospital stay. If you have any questions about your discharge medications or the care you received while you were in the hospital after you are discharged, you can call the unit and asked to speak with the hospitalist on call if the hospitalist that took care of you is not available. Once you are discharged, your primary care physician will handle any further medical issues. Please note that NO REFILLS for any discharge medications will be authorized once you are discharged, as it is imperative that you return to your primary care physician (or establish a relationship with a primary care physician if you do not have one) for your aftercare needs so that they can reassess your need for medications and monitor your lab values.    Today   CHIEF COMPLAINT:   Chief Complaint  Patient presents with   Sore Throat    HISTORY OF PRESENT ILLNESS:  Andrew Pratt  is a 64 y.o. male came in initially with sore throat   VITAL SIGNS:  Blood pressure 109/70, pulse 86, temperature 98.7 F (37.1 C), temperature source Oral, resp. rate 16, height 5\' 1"  (1.549 m), weight 44.9 kg, SpO2 98 %.  I/O:    Intake/Output Summary (Last 24 hours) at 03/12/2020 1602 Last data filed at 03/12/2020 1359 Gross per 24 hour  Intake 1469 ml  Output 1050 ml  Net 419 ml    PHYSICAL EXAMINATION:  GENERAL:  64 y.o.-year-old patient lying in the bed with no acute distress.  EYES: Pupils equal,  round, reactive to light and accommodation. No scleral icterus. HEENT: Head atraumatic, normocephalic. Oropharynx and nasopharynx clear.   LUNGS: Normal breath sounds bilaterally, no wheezing, rales,rhonchi or crepitation. No use of accessory muscles of respiration.  CARDIOVASCULAR: S1, S2 normal. No murmurs, rubs, or gallops.  ABDOMEN: Soft, non-tender, non-distended.  EXTREMITIES: No pedal edema.  NEUROLOGIC: Cranial nerves II through XII are intact. Muscle strength 5/5 in all extremities. Sensation intact. Gait not checked.  PSYCHIATRIC: The patient is alert and oriented x 3.  SKIN: No obvious rash, lesion, or ulcer.   DATA REVIEW:   CBC Recent Labs  Lab 03/12/20 0636  WBC 12.5*  HGB 9.3*  HCT 29.7*  PLT 398    Chemistries  Recent Labs  Lab 03/11/20 0440 03/11/20 0440 03/12/20 0636  NA 142   < > 139  K 3.7   < > 3.7  CL 107   < > 104  CO2 28   < >  27  GLUCOSE 113*   < > 103*  BUN 11   < > 12  CREATININE 0.52*   < > 0.58*  CALCIUM 8.6*   < > 9.0  MG 1.9  --   --    < > = values in this interval not displayed.    Microbiology Results  Results for orders placed or performed during the hospital encounter of 03/05/20  Respiratory Panel by RT PCR (Flu A&B, Covid) - Nasopharyngeal Swab     Status: None   Collection Time: 03/05/20 12:55 PM   Specimen: Nasopharyngeal Swab  Result Value Ref Range Status   SARS Coronavirus 2 by RT PCR NEGATIVE NEGATIVE Final    Comment: (NOTE) SARS-CoV-2 target nucleic acids are NOT DETECTED.  The SARS-CoV-2 RNA is generally detectable in upper respiratoy specimens during the acute phase of infection. The lowest concentration of SARS-CoV-2 viral copies this assay can detect is 131 copies/mL. A negative result does not preclude SARS-Cov-2 infection and should not be used as the sole basis for treatment or other patient management decisions. A negative result may occur with  improper specimen collection/handling, submission of specimen  other than nasopharyngeal swab, presence of viral mutation(s) within the areas targeted by this assay, and inadequate number of viral copies (<131 copies/mL). A negative result must be combined with clinical observations, patient history, and epidemiological information. The expected result is Negative.  Fact Sheet for Patients:  PinkCheek.be  Fact Sheet for Healthcare Providers:  GravelBags.it  This test is no t yet approved or cleared by the [place] FDA and  has been authorized for detection and/or diagnosis of SARS-CoV-2 by FDA under an Emergency Use Authorization (EUA). This EUA will remain  in effect (meaning this test can be used) for the duration of the COVID-19 declaration under Section 564(b)(1) of the Act, 21 U.S.C. section 360bbb-3(b)(1), unless the authorization is terminated or revoked sooner.     Influenza A by PCR NEGATIVE NEGATIVE Final   Influenza B by PCR NEGATIVE NEGATIVE Final    Comment: (NOTE) The Xpert Xpress SARS-CoV-2/FLU/RSV assay is intended as an aid in  the diagnosis of influenza from Nasopharyngeal swab specimens and  should not be used as a sole basis for treatment. Nasal washings and  aspirates are unacceptable for Xpert Xpress SARS-CoV-2/FLU/RSV  testing.  Fact Sheet for Patients: PinkCheek.be  Fact Sheet for Healthcare Providers: GravelBags.it  This test is not yet approved or cleared by the [place] FDA and  has been authorized for detection and/or diagnosis of SARS-CoV-2 by  FDA under an Emergency Use Authorization (EUA). This EUA will remain  in effect (meaning this test can be used) for the duration of the  Covid-19 declaration under Section 564(b)(1) of the Act, 21  U.S.C. section 360bbb-3(b)(1), unless the authorization is  terminated or revoked. Performed at [facility], [address]  [place], [place] [address]     RADIOLOGY:  [facility] Chest Port 1 View  Result Date: 03/12/2020 CLINICAL DATA:  Aspiration pneumonia EXAM: PORTABLE CHEST 1 VIEW COMPARISON:  03/08/2020 chest radiograph and prior. FINDINGS: Right chest wall Port-A-Cath and right PICC tips overlies the cavoatrial junction/superior right atrium. Decreased conspicuity of subdiaphragmatic pneumoperitoneum, compatible with recent postprocedural sequela. Hazy perihilar and bibasilar opacities, grossly unchanged. No pneumothorax or pleural effusion. Cardiomediastinal silhouette within normal limits. IMPRESSION: Decreased subdiaphragmatic pneumoperitoneum, compatible with postprocedural sequela. Perihilar/bibasilar hazy opacities, unchanged. Electronically Signed   By: Primitivo Gauze M.D.   On: 03/12/2020 07:29  Management plans discussed with the patient, family and they are in agreement.  CODE STATUS:     Code Status Orders  (From admission, onward)         Start     Ordered   03/06/20 2049  Full code  Continuous        03/06/20 2048        Code Status History    This patient has a current code status but no historical code status.   Advance Care Planning Activity      TOTAL TIME TAKING CARE OF THIS PATIENT: 37 minutes.    Loletha Grayer M.D on 03/12/2020 at 4:02 PM  Between 7am to 6pm - Pager - [phone]  After 6pm go to [email or web address] - password [email or web address]  [facility]  CC: Primary care physician; Dr Andrew Pratt

## 2020-03-12 NOTE — Plan of Care (Signed)
Discharge teaching completed with patient who verbalized understanding of teaching.

## 2020-03-12 NOTE — Discharge Instructions (Signed)
Goal tube feeding 5 cans a day.  Can give 1/2 can every couple hours while awake (if trouble tolerating the full can).

## 2020-03-12 NOTE — Plan of Care (Signed)
Continuing with plan of care. 

## 2020-03-14 ENCOUNTER — Other Ambulatory Visit: Payer: Self-pay

## 2020-03-14 ENCOUNTER — Ambulatory Visit
Admission: RE | Admit: 2020-03-14 | Discharge: 2020-03-14 | Disposition: A | Discharge status: Home / Self Care | Payer: Medicaid Other | Source: Ambulatory Visit | Attending: Radiation Oncology | Admitting: Radiation Oncology

## 2020-03-14 ENCOUNTER — Encounter (INDEPENDENT_AMBULATORY_CARE_PROVIDER_SITE_OTHER): Payer: Self-pay

## 2020-03-14 ENCOUNTER — Encounter: Payer: Self-pay | Admitting: Radiation Oncology

## 2020-03-14 ENCOUNTER — Inpatient Hospital Stay: Payer: Medicaid Other | Attending: Oncology | Admitting: Oncology

## 2020-03-14 VITALS — BP 106/72 | HR 119 | Temp 97.0°F | Resp 16 | Wt 96.3 lb

## 2020-03-14 DIAGNOSIS — E43 Unspecified severe protein-calorie malnutrition: Secondary | ICD-10-CM

## 2020-03-14 DIAGNOSIS — C159 Malignant neoplasm of esophagus, unspecified: Secondary | ICD-10-CM

## 2020-03-14 DIAGNOSIS — Z7189 Other specified counseling: Secondary | ICD-10-CM

## 2020-03-14 DIAGNOSIS — Z79899 Other long term (current) drug therapy: Secondary | ICD-10-CM | POA: Insufficient documentation

## 2020-03-14 DIAGNOSIS — F17210 Nicotine dependence, cigarettes, uncomplicated: Secondary | ICD-10-CM | POA: Insufficient documentation

## 2020-03-14 DIAGNOSIS — Z801 Family history of malignant neoplasm of trachea, bronchus and lung: Secondary | ICD-10-CM | POA: Diagnosis not present

## 2020-03-14 DIAGNOSIS — C778 Secondary and unspecified malignant neoplasm of lymph nodes of multiple regions: Secondary | ICD-10-CM | POA: Insufficient documentation

## 2020-03-14 DIAGNOSIS — Z809 Family history of malignant neoplasm, unspecified: Secondary | ICD-10-CM | POA: Insufficient documentation

## 2020-03-14 DIAGNOSIS — Z87891 Personal history of nicotine dependence: Secondary | ICD-10-CM | POA: Diagnosis not present

## 2020-03-14 DIAGNOSIS — C155 Malignant neoplasm of lower third of esophagus: Secondary | ICD-10-CM | POA: Diagnosis not present

## 2020-03-14 DIAGNOSIS — K5900 Constipation, unspecified: Secondary | ICD-10-CM | POA: Diagnosis not present

## 2020-03-14 DIAGNOSIS — C770 Secondary and unspecified malignant neoplasm of lymph nodes of head, face and neck: Secondary | ICD-10-CM | POA: Insufficient documentation

## 2020-03-14 DIAGNOSIS — Z5111 Encounter for antineoplastic chemotherapy: Secondary | ICD-10-CM | POA: Insufficient documentation

## 2020-03-14 DIAGNOSIS — D509 Iron deficiency anemia, unspecified: Secondary | ICD-10-CM | POA: Diagnosis not present

## 2020-03-14 NOTE — Consult Note (Signed)
NEW PATIENT EVALUATION  Name: Andrew Pratt  MRN: [id number]  Date:   03/14/2020     DOB: 1955-10-11   This 64 y.o. male patient presents to the clinic for initial evaluation of probable stage IV squamous cell carcinoma of the esophagus.  REFERRING PHYSICIAN: No ref. provider found  CHIEF COMPLAINT:  Chief Complaint  Patient presents with  . Esophageal Cancer    Initial consult    DIAGNOSIS: The encounter diagnosis was Malignant neoplasm of lower third of esophagus ([place]).   PREVIOUS INVESTIGATIONS:  CT scan reviewed Pathology report reviewed Clinical notes reviewed  HPI: Patient is a 64 year old male who is noticed for about 6 months increasing dysphagia.  He underwent upper endoscopy October 6 showing an obstructive malignant appearing tumor in the distal esophagus biopsy positive for invasive squamous cell carcinoma.  He has only ability to swallow water.  CT scan of his chest showed a large irregular ulcerated mass involving the distal third of the esophagus.  There is also enlarged superior mediastinal and right supraclavicular lymph nodes consistent with metastatic disease.  He also had mild prominent gastrohepatic ligament lymph nodes suspicious for nodal metastasis.  Patient has a port placed and also has a PEG tube placed.  He is seen today for radiation oncology opinion.  He has had a PET CT scan ordered although not yet scheduled.  PLANNED TREATMENT REGIMEN: Probable palliative radiation therapy with concurrent chemotherapy  PAST MEDICAL HISTORY:  has a past medical history of Cancer ([place]).    PAST SURGICAL HISTORY:  Past Surgical History:  Procedure Laterality Date  . ESOPHAGOGASTRODUODENOSCOPY (EGD) WITH PROPOFOL N/A 03/02/2020   Procedure: ESOPHAGOGASTRODUODENOSCOPY (EGD) WITH PROPOFOL;  Surgeon: Lin Landsman, MD;  Location: [facility];  Service: Gastroenterology;  Laterality: N/A;  . MASS BIOPSY N/A 03/08/2020   Procedure: NECK MASS BIOPSY;  Surgeon:  Jules Husbands, MD;  Location: [facility];  Service: General;  Laterality: N/A;  . PORTACATH PLACEMENT N/A 03/08/2020   Procedure: INSERTION PORT-A-CATH;  Surgeon: Jules Husbands, MD;  Location: [facility];  Service: General;  Laterality: N/A;    FAMILY HISTORY: family history includes Cancer in his mother.  SOCIAL HISTORY:  reports that he has been smoking cigarettes. He has been smoking about 0.50 packs per day. He has never used smokeless tobacco. He reports current alcohol use of about 7.0 standard drinks of alcohol per week. He reports previous drug use.  ALLERGIES: Patient has no known allergies.  MEDICATIONS:  Current Outpatient Medications  Medication Sig Dispense Refill  . acetaminophen (TYLENOL) 325 MG tablet Place 2 tablets (650 mg total) into feeding tube every 6 (six) hours as needed for mild pain (or Fever >/= 101).    Marland Kitchen albuterol (VENTOLIN HFA) 108 (90 Base) MCG/ACT inhaler Inhale 2 puffs into the lungs every 6 (six) hours as needed for wheezing or shortness of breath. 8 g 0  . amoxicillin-clavulanate (AUGMENTIN) 250-62.5 MG/5ML suspension Place 10 mLs (500 mg total) into feeding tube 2 (two) times daily for 3 days. 60 mL 0  . metoCLOPramide (REGLAN) 5 MG/5ML solution Take 5 mLs (5 mg total) by mouth 4 (four) times daily -  before meals and at bedtime. 600 mL 0  . Morphine Sulfate (MORPHINE CONCENTRATE) 10 MG/0.5ML SOLN concentrated solution Take 0.25 mLs (5 mg total) by mouth every 4 (four) hours as needed for severe pain. 30 mL 0  . nicotine (NICODERM CQ - DOSED IN MG/24 HOURS) 21 mg/24hr patch One 21mg  patch chest wall  daily (okay to substitute generic) 28 patch 0  . Nutritional Supplements (FEEDING SUPPLEMENT, OSMOLITE 1.5 CAL,) LIQD Place 237 mLs into feeding tube 5 (five) times daily.  0  . ondansetron (ZOFRAN ODT) 4 MG disintegrating tablet Take 1 tablet (4 mg total) by mouth every 8 (eight) hours as needed for nausea or vomiting. 20 tablet 0  . Water For Irrigation, Sterile  (FREE WATER) SOLN Place 100 mLs into feeding tube 5 (five) times daily. 15000 mL 0   No current facility-administered medications for this encounter.    ECOG PERFORMANCE STATUS:  1 - Symptomatic but completely ambulatory  REVIEW OF SYSTEMS: Patient denies any weight loss, fatigue, weakness, fever, chills or night sweats. Patient denies any loss of vision, blurred vision. Patient denies any ringing  of the ears or hearing loss. No irregular heartbeat. Patient denies heart murmur or history of fainting. Patient denies any chest pain or pain radiating to her upper extremities. Patient denies any shortness of breath, difficulty breathing at night, cough or hemoptysis. Patient denies any swelling in the lower legs. Patient denies any nausea vomiting, vomiting of blood, or coffee ground material in the vomitus. Patient denies any stomach pain. Patient states has had normal bowel movements no significant constipation or diarrhea. Patient denies any dysuria, hematuria or significant nocturia. Patient denies any problems walking, swelling in the joints or loss of balance. Patient denies any skin changes, loss of hair or loss of weight. Patient denies any excessive worrying or anxiety or significant depression. Patient denies any problems with insomnia. Patient denies excessive thirst, polyuria, polydipsia. Patient denies any swollen glands, patient denies easy bruising or easy bleeding. Patient denies any recent infections, allergies or URI. Patient "s visual fields have not changed significantly in recent time.   PHYSICAL EXAM: BP 106/72 (BP Location: Left Arm, Patient Position: Sitting)   Pulse (!) 119   Temp (!) 97 F (36.1 C) (Tympanic)   Resp 16   Wt 96 lb 4.8 oz (43.7 kg)   BMI 18.20 kg/m  Thin slightly cachectic male in NAD has a Port-A-Cath placed as well as a PEG tube placed.  Well-developed well-nourished patient in NAD. HEENT reveals PERLA, EOMI, discs not visualized.  Oral cavity is clear. No  oral mucosal lesions are identified. Neck is clear without evidence of cervical or supraclavicular adenopathy. Lungs are clear to A&P. Cardiac examination is essentially unremarkable with regular rate and rhythm without murmur rub or thrill. Abdomen is benign with no organomegaly or masses noted. Motor sensory and DTR levels are equal and symmetric in the upper and lower extremities. Cranial nerves II through XII are grossly intact. Proprioception is intact. No peripheral adenopathy or edema is identified. No motor or sensory levels are noted. Crude visual fields are within normal range.  LABORATORY DATA: Pathology report reviewed    RADIOLOGY RESULTS: CT scan ordered PET CT scan scheduled   IMPRESSION: Probable stage IV squamous cell carcinoma the distal esophagus in 64 year old male  PLAN: At this time I would like to review his PET CT scan and we are scheduling that.  I will discussed the case personally with medical oncology when final staging is reviewed.  A 4-week course of palliative radiation therapy to try to alleviate some of his dysphagia may be indicated.  I have briefly discus sed that with the patient.  I will follow up with the patient for simulation should we decide to go ahead with palliative radiation therapy.  Patient will certainly benefit from systemic chemotherapy.  I would like to take this opportunity to thank you for allowing me to participate in the care of your patient.Noreene Filbert, MD

## 2020-03-15 ENCOUNTER — Telehealth: Payer: Self-pay

## 2020-03-15 ENCOUNTER — Encounter: Payer: Self-pay | Admitting: Oncology

## 2020-03-15 NOTE — Progress Notes (Signed)
Pharmacist Chemotherapy Monitoring - Initial Assessment    Anticipated start date: 03/22/20  Regimen:  . Are orders appropriate based on the patient's diagnosis, regimen, and cycle? Yes . Does the plan date match the patient's scheduled date? Yes . Is the sequencing of drugs appropriate? Yes . Are the premedications appropriate for the patient's regimen? Yes . Prior Authorization for treatment is: Uninsured o If applicable, is the correct biosimilar selected based on the patient's insurance? not applicable  Organ Function and Labs: Marland Kitchen Are dose adjustments needed based on the patient's renal function, hepatic function, or hematologic function? No . Are appropriate labs ordered prior to the start of patient's treatment? Yes . Other organ system assessment, if indicated: N/A . The following baseline labs, if indicated, have been ordered: nivolumab: baseline TSH +/- T4  Dose Assessment: . Are the drug doses appropriate? Yes . Are the following correct: o Drug concentrations Yes o IV fluid compatible with drug Yes o Administration routes Yes o Timing of therapy Yes . If applicable, does the patient have documented access for treatment and/or plans for port-a-cath placement? yes . If applicable, have lifetime cumulative doses been properly documented and assessed? yes Lifetime Dose Tracking  No doses have been documented on this patient for the following tracked chemicals: Doxorubicin, Epirubicin, Idarubicin, Daunorubicin, Mitoxantrone, Bleomycin, Oxaliplatin, Carboplatin, Liposomal Doxorubicin  o   Toxicity Monitoring/Prevention: . The patient has the following take home antiemetics prescribed: Ondansetron, Prochlorperazine, Dexamethasone and Lorazepam . The patient has the following take home medications prescribed: N/A . Medication allergies and previous infusion related reactions, if applicable, have been reviewed and addressed. Yes . The patient's current medication list has been  assessed for drug-drug interactions with their chemotherapy regimen. no significant drug-drug interactions were identified on review.  Order Review: . Are the treatment plan orders signed? No . Is the patient scheduled to see a provider prior to their treatment? Yes  I verify that I have reviewed each item in the above checklist and answered each question accordingly.  Andrew Pratt 03/15/2020 2:46 PM

## 2020-03-15 NOTE — Progress Notes (Signed)
START OFF PATHWAY REGIMEN - Gastroesophageal   [id number] q14 Days + Nivolumab 240 mg IV D1 q14 Days:   A cycle is every 14 days:     Nivolumab      Oxaliplatin      Leucovorin      Fluorouracil      Fluorouracil   **Always confirm dose/schedule in your pharmacy ordering system**  Patient Characteristics: Distant Metastases (cM1/pM1) / Locally Recurrent Disease, Squamous Cell, Esophageal & GE Junction, First Line, PD?L1 Expression  CPS < 10/Negative/Unknown, No Prior Taxane Histology: Squamous Cell Disease Classification: Esophageal Therapeutic Status: Distant Metastases (No Additional Staging) Line of Therapy: First Line PD-L1 Expression Status: Awaiting Test Results Taxane Status: No Prior Taxane Intent of Therapy: Non-Curative / Palliative Intent, Discussed with Patient

## 2020-03-15 NOTE — Progress Notes (Signed)
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 Marius Ditch as an outpatient for symptoms of dysphagia.  He underwent EGD on 03/02/2020 which showed a large fungating and ulcerating mass with bleeding and stigmata of recent bleeding in the distal esophagus 30 cm from the incisors.  The degree of obstruction was severe enough that the scope could not be passed beyond the distal esophagus.  Patient was supposed to see me today as an outpatient but then got admitted to the hospital with severe dysphagia to the point that he was not able to swallow anything.  Patient currently reports feeling fatigued.  He lives with his niece and has had more than 20 pound weight loss in the last 2 months.  Denies any pain.  CT chest abdomen pelvis with contrast done yesterday shows a large irregular ulcerated mass in the distal third of the esophagus with proximal esophagus being dilated and fluid-filled.  Bilateral paratracheal and mediastinal adenopathy noted.  Also found to have a 2.6 cm right supraclavicular lymph node no evidence of liver or other organ metastases.  Supraclavicular lymph node was biopsied and was consistent with squamous cell carcinoma   Interval history-patient is tolerating his tube feeds well.  He has also seen Dr. Donella Stade who plans to start radiation soon.  He has to constantly spit up his saliva as he is unable to swallow anything at this time.  ECOG PS-  1 Pain scale- 3 Opioid associated constipation- no  Review of systems- Review of Systems  Constitutional: Positive for malaise/fatigue. Negative for chills, fever and weight loss.  HENT: Negative for congestion, ear discharge and nosebleeds.   Eyes: Negative for blurred vision.  Respiratory: Negative for cough, hemoptysis, sputum production, shortness of breath and wheezing.   Cardiovascular: Negative for chest pain, palpitations, orthopnea and claudication.  Gastrointestinal: Negative for abdominal pain, blood in stool, constipation, diarrhea, heartburn, melena, nausea and vomiting.       Inability to swallow  Genitourinary: Negative for dysuria, flank pain, frequency, hematuria and urgency.  Musculoskeletal: Negative for back pain, joint pain and myalgias.  Skin: Negative for rash.  Neurological: Negative for dizziness, tingling, focal weakness, seizures, weakness and headaches.  Endo/Heme/Allergies: Does not bruise/bleed easily.  Psychiatric/Behavioral: Negative for depression and suicidal ideas. The patient does not have insomnia.       No Known Allergies   Past Medical History:  Diagnosis Date  . Cancer [facility])      Past Surgical History:  Procedure Laterality Date  . ESOPHAGOGASTRODUODENOSCOPY (EGD) WITH PROPOFOL N/A 03/02/2020   Procedure: ESOPHAGOGASTRODUODENOSCOPY (EGD) WITH PROPOFOL;  Surgeon: Lin Landsman, MD;  Location: [place];  Service: Gastroenterology;  Laterality: N/A;  . MASS BIOPSY N/A 03/08/2020   Procedure: NECK MASS BIOPSY;  Surgeon: Jules Husbands, MD;  Location: [facility];  Service: General;  Laterality: N/A;  . PORTACATH PLACEMENT N/A 03/08/2020   Procedure: INSERTION PORT-A-CATH;  Surgeon: Jules Husbands, MD;  Location: [facility];  Service: General;  Laterality: N/A;  Social History   Socioeconomic History  . Marital status: Single    Spouse name: Not on file  . Number of children: Not on file  . Years of education: Not on file  .  Highest education level: Not on file  Occupational History  . Not on file  Tobacco Use  . Smoking status: Current Every Day Smoker    Packs/day: 0.50    Types: Cigarettes  . Smokeless tobacco: Never Used  Vaping Use  . Vaping Use: Never used  Substance and Sexual Activity  . Alcohol use: Yes    Alcohol/week: 7.0 standard drinks    Types: 7 Cans of beer per week  . Drug use: Not Currently  . Sexual activity: Not on file  Other Topics Concern  . Not on file  Social History Narrative  . Not on file   Social Determinants of Health   Financial Resource Strain:   . Difficulty of Paying Living Expenses: Not on file  Food Insecurity:   . Worried About Charity fundraiser in the Last Year: Not on file  . Ran Out of Food in the Last Year: Not on file  Transportation Needs:   . Lack of Transportation (Medical): Not on file  . Lack of Transportation (Non-Medical): Not on file  Physical Activity:   . Days of Exercise per Week: Not on file  . Minutes of Exercise per Session: Not on file  Stress:   . Feeling of Stress : Not on file  Social Connections:   . Frequency of Communication with Friends and Family: Not on file  . Frequency of Social Gatherings with Friends and Family: Not on file  . Attends Religious Services: Not on file  . Active Member of Clubs or Organizations: Not on file  . Attends Archivist Meetings: Not on file  . Marital Status: Not on file  Intimate Partner Violence:   . Fear of Current or Ex-Partner: Not on file  . Emotionally Abused: Not on file  . Physically Abused: Not on file  . Sexually Abused: Not on file    Family History  Problem Relation Age of Onset  . Cancer Mother        lung     Current Outpatient Medications:  .  acetaminophen (TYLENOL) 325 MG tablet, Place 2 tablets (650 mg total) into feeding tube every 6 (six) hours as needed for mild pain (or Fever >/= 101)., Disp: , Rfl:  .  albuterol (VENTOLIN HFA) 108 (90 Base) MCG/ACT  inhaler, Inhale 2 puffs into the lungs every 6 (six) hours as needed for wheezing or shortness of breath., Disp: 8 g, Rfl: 0 .  amoxicillin-clavulanate (AUGMENTIN) 250-62.5 MG/5ML suspension, Place 10 mLs (500 mg total) into feeding tube 2 (two) times daily for 3 days., Disp: 60 mL, Rfl: 0 .  metoCLOPramide (REGLAN) 5 MG/5ML solution, Take 5 mLs (5 mg total) by mouth 4 (four) times daily -  before meals and at bedtime., Disp: 600 mL, Rfl: 0 .  Morphine Sulfate (MORPHINE CONCENTRATE) 10 MG/0.5ML SOLN concentrated solution, Take 0.25 mLs (5 mg total) by mouth every 4 (four) hours as needed for severe pain., Disp: 30 mL, Rfl: 0 .  nicotine (NICODERM CQ - DOSED IN MG/24 HOURS) 21 mg/24hr patch, One 26m patch chest wall daily (okay to substitute generic), Disp: 28 patch, Rfl: 0 .  Nutritional Supplements (FEEDING SUPPLEMENT, OSMOLITE 1.5 CAL,) LIQD, Place 237 mLs into feeding tube 5 (five) times daily., Disp: , Rfl:  0 .  ondansetron (ZOFRAN ODT) 4 MG disintegrating tablet, Take 1 tablet (4 mg total) by mouth every 8 (eight) hours as needed for nausea or vomiting., Disp: 20 tablet, Rfl: 0 .  Water For Irrigation, Sterile (FREE WATER) SOLN, Place 100 mLs into feeding tube 5 (five) times daily., Disp: 15000 mL, Rfl: 0  Physical exam:  Vitals:   03/14/20 1013  BP: 106/72  Pulse: (!) 119  Resp: 16  Temp: (!) 97 F (36.1 C)  SpO2: 100%  Weight: 96 lb 4.8 oz (43.7 kg)   Physical Exam Constitutional:      General: He is not in acute distress. Cardiovascular:     Rate and Rhythm: Normal rate and regular rhythm.     Heart sounds: Normal heart sounds.  Pulmonary:     Effort: Pulmonary effort is normal.     Breath sounds: Normal breath sounds.  Abdominal:     General: Bowel sounds are normal.     Palpations: Abdomen is soft.  Skin:    General: Skin is warm and dry.  Neurological:     Mental Status: He is alert and oriented to person, place, and time.      CMP Latest Ref Rng & Units 03/12/2020    Glucose 70 - 99 mg/dL 103(H)  BUN 8 - 23 mg/dL 12  Creatinine 0.61 - 1.24 mg/dL 0.58(L)  Sodium 135 - 145 mmol/L 139  Potassium 3.5 - 5.1 mmol/L 3.7  Chloride 98 - 111 mmol/L 104  CO2 22 - 32 mmol/L 27  Calcium 8.9 - 10.3 mg/dL 9.0  Total Protein 6.5 - 8.1 g/dL -  Total Bilirubin 0.3 - 1.2 mg/dL -  Alkaline Phos 38 - 126 U/L -  AST 15 - 41 U/L -  ALT 0 - 44 U/L -   CBC Latest Ref Rng & Units 03/12/2020  WBC 4.0 - 10.5 K/uL 12.5(H)  Hemoglobin 13.0 - 17.0 g/dL 9.3(L)  Hematocrit 39 - 52 % 29.7(L)  Platelets 150 - 400 K/uL 398    No images are attached to the encounter.  CT CHEST ABDOMEN PELVIS W CONTRAST  Result Date: 03/06/2020 CLINICAL DATA:  Esophageal cancer.  Staging. EXAM: CT CHEST, ABDOMEN, AND PELVIS WITH CONTRAST TECHNIQUE: Multidetector CT imaging of the chest, abdomen and pelvis was performed following the standard protocol during bolus administration of intravenous contrast. CONTRAST:  59m OMNIPAQUE IOHEXOL 300 MG/ML  SOLN COMPARISON:  Esophagram 02/18/2020. FINDINGS: CT CHEST FINDINGS Cardiovascular: No significant vascular findings. Right supraclavicular adenopathy exerts mild mass effect on the right internal jugular vein which remains patent. The heart size is normal. There is no pericardial effusion. Mediastinum/Nodes: As demonstrated on recent esophagram, there is a large irregular ulcerated mass involving the distal 3rd of the esophagus, extending approximately 6.3 cm in length on sagittal image 87/6. The proximal esophagus is moderately dilated and fluid-filled. There are mildly prominent lymph nodes in the lower mediastinum, including an 11 mm subcarinal node on image 32/2 and 8 mm left hilar node on image 32/2. There are larger superior mediastinal lymph nodes, including a 2.5 cm right paratracheal node on image 11/2 and a 2.6 cm right supraclavicular node on image 6/2. The thyroid gland and trachea demonstrate no significant findings. Lungs/Pleura: No pleural  effusion or pneumothorax. There are patchy ground-glass opacities and tree-in-bud nodularity in the left lower lobe with lesser involvement of the lingula, likely inflammatory. Largest nodular component in the superior segment of the lower lobe measures 5 mm on image 69/4. No  right lung nodularity. Musculoskeletal/Chest wall: No chest wall mass or suspicious osseous findings. CT ABDOMEN AND PELVIS FINDINGS Hepatobiliary: The liver is normal in density without suspicious focal abnormality. No evidence of gallstones, gallbladder wall thickening or biliary dilatation. Pancreas: Unremarkable. No pancreatic ductal dilatation or surrounding inflammatory changes. Spleen: Normal in size without focal abnormality. Adrenals/Urinary Tract: Both adrenal glands appear normal. Both kidneys appear normal. No evidence of renal mass, urinary tract calculus or hydronephrosis. Mild bladder wall thickening likely relates to incomplete bladder distension. Stomach/Bowel: The stomach is decompressed with mild wall thickening, likely due to incomplete distension. No focal abnormality identified. The small bowel and appendix appear normal. There is moderate stool throughout the colon with scattered diverticula. No bowel wall thickening, distention or surrounding inflammation. Vascular/Lymphatic: A 10 mm lymph node in the gastrohepatic ligament on image 52/2 is suspicious for a nodal metastasis. No other enlarged lymph nodes are seen within the abdomen or pelvis. Mild aortic and iliac atherosclerosis without acute vascular findings. The portal, superior mesenteric and splenic veins are patent. Reproductive: The prostate gland and seminal vesicles appear normal. Other: Small amount of free pelvic fluid. No focal fluid collection or peritoneal nodularity. Musculoskeletal: No acute or significant osseous findings. Mild lumbar spine facet arthropathy. IMPRESSION: 1. Large irregular ulcerated mass involving the distal 3rd of the esophagus  consistent with known esophageal cancer. The proximal esophagus is moderately dilated and fluid-filled. 2. Enlarged superior mediastinal and right supraclavicular lymph nodes consistent with metastatic disease. Mildly prominent gastrohepatic ligament lymph node suspicious for nodal metastasis. No evidence of distant metastatic disease. 3. Patchy ground-glass opacities and tree-in-bud nodularity in the left lower lobe and lingula, likely inflammatory. Attention on follow-up recommended. 4. Aortic Atherosclerosis (ICD10-I70.0). Electronically Signed   By: Richardean Sale M.D.   On: 03/06/2020 12:27   DG Chest Port 1 View  Result Date: 03/12/2020 CLINICAL DATA:  Aspiration pneumonia EXAM: PORTABLE CHEST 1 VIEW COMPARISON:  03/08/2020 chest radiograph and prior. FINDINGS: Right chest wall Port-A-Cath and right PICC tips overlies the cavoatrial junction/superior right atrium. Decreased conspicuity of subdiaphragmatic pneumoperitoneum, compatible with recent postprocedural sequela. Hazy perihilar and bibasilar opacities, grossly unchanged. No pneumothorax or pleural effusion. Cardiomediastinal silhouette within normal limits. IMPRESSION: Decreased subdiaphragmatic pneumoperitoneum, compatible with postprocedural sequela. Perihilar/bibasilar hazy opacities, unchanged. Electronically Signed   By: Primitivo Gauze M.D.   On: 03/12/2020 07:29   DG CHEST PORT 1 VIEW  Addendum Date: 03/08/2020   ADDENDUM REPORT: 03/08/2020 19:05 ADDENDUM: I was notified that the patient just had a percutaneous gastrostomy placement with insufflation of the abdomen. The free air noted in the upper abdomen is secondary to recent procedure. Electronically Signed   By: Anner Crete M.D.   On: 03/08/2020 19:05   Result Date: 03/08/2020 CLINICAL DATA:  64 year old male status post Port-A-Cath placement. EXAM: PORTABLE CHEST 1 VIEW COMPARISON:  CT of the chest abdomen pelvis dated 03/06/2020. FINDINGS: Right-sided Port-A-Cath with  tip at the cavoatrial junction. Right-sided PICC with tip over upper SVC. There are bilateral lucencies along the diaphragms which may represent sub pulmonic pneumothoraces versus pneumoperitoneum. Further evaluation with chest CT is recommended. Right lung base density may represent atelectasis or infiltrate. Faint densities noted in the left mid to lower lung field. There is no pleural effusion. The cardiac silhouette is within limits. No acute osseous pathology. IMPRESSION: 1. Right-sided Port-A-Cath with tip over the upper SVC. 2. Bilateral pulmonary lucencies may represent sub pulmonic pneumothoraces versus pneumoperitoneum. Further evaluation with chest CT is recommended. 3. Right lung  base density and left mid to lower lung field faint densities. These results were called by telephone at the time of interpretation on 03/08/2020 at 6:39 pm to Nurse Rodolph Bong, who verbally acknowledged these results. Electronically Signed: By: Anner Crete M.D. On: 03/08/2020 18:49   DG C-Arm 1-60 Min-No Report  Result Date: 03/08/2020 Fluoroscopy was utilized by the requesting physician.  No radiographic interpretation.   [place] EKG SITE RITE  Result Date: 03/06/2020 If Site Rite image not attached, placement could not be confirmed due to current cardiac rhythm.  DG ESOPHAGUS W SINGLE CM (SOL OR THIN BA)  Result Date: 02/18/2020 CLINICAL DATA:  Dysphagia. Change in voice. Prior choking injury. Swollen lymph node right side. EXAM: ESOPHOGRAM/BARIUM SWALLOW TECHNIQUE: Combined double contrast and single contrast examination performed using thick and thin barium. FLUOROSCOPY TIME:  Fluoroscopy Time:  1 minutes 18 seconds Radiation Exposure Index (if provided by the fluoroscopic device): 10.9 mGy COMPARISON:  No prior. FINDINGS: Mild deformity noted the posterior cervical esophagus secondary to prominent vertebral endplate osteophytes. Cervical esophagus is otherwise normal. No aspiration. An approximately 5 cm  long irregular prominent stricture with prominent ulcerations noted in the distal thoracic esophagus. This is very suspicious for esophageal malignancy. Endoscopic evaluation is suggested. No hiatal hernia or reflux noted. IMPRESSION: An approximately 5 cm long irregular prominent stricture with prominent ulcerations noted in the distal thoracic esophagus. This is very suspicious for esophageal malignancy. Endoscopic evaluation is suggested. These results will be called to the ordering clinician or representative by the Radiologist Assistant, and communication documented in the PACS or [employer]. Electronically Signed   By: Marcello Moores  Register   On: 02/18/2020 10:10     Assessment and plan- Patient is a 64 y.o. male with newly diagnosed squamous cell carcinoma of the lower third of the esophagus with supraclavicular lymph node metastases  Discussed the results with the pathology with the patient which show squamous cell carcinoma with biopsy-proven supraclavicular lymph node metastases which makes her stage IV.  Patient has complete lower esophageal obstruction and is unable to swallow anything including his saliva.  He has a PEG tube in place presently and has also met with radiation oncology for consideration of palliative radiation for his dysphagia.  Patient also has a port in place for chemotherapy.  I will add a PD-L1 to his pathology specimen but given his squamous cell histology I wouldPalliative chemoimmunotherapy with modified FOLFOX given every [redacted] weeks along with Opdivo.  Discussed risks and benefits of FOLFOX including all but not limited to nausea, vomiting, low blood counts, risk of infections and hospitalizations.  Risk of peripheral neuropathy associated with oxaliplatin.  Also discussed risks and benefits of Opdivo including all but not limited to autoimmune side effect such as colitis pneumonitis and the need to monitor liver thyroid and kidney functions.  Treatment will be given with a  palliative intent.  Patient understands and agrees to proceed as planned.  I will tentatively see him in 1 week's time to start FOLFOX Opdivo treatment  Patient will also be seen by nutrition as an outpatient to guide his tube feeds.   Visit Diagnosis 1. Goals of care, counseling/discussion   2. Squamous cell carcinoma, esophagus (HCC)      Dr. Randa Evens, MD, MPH [facility] [phone] 03/15/2020 12:43 PM

## 2020-03-15 NOTE — Telephone Encounter (Signed)
Request for PD-L1 testing sent for specimen [id number], lymph node, neck, collected 03/08/20.

## 2020-03-16 NOTE — Patient Instructions (Signed)
Nivolumab injection What is this medicine? NIVOLUMAB (nye VOL ue mab) is a monoclonal antibody. It is used to treat colon cancer, esophageal cancer, head and neck cancer, Hodgkin lymphoma, kidney cancer, liver cancer, lung cancer, mesothelioma, melanoma, and urothelial cancer. This medicine may be used for other purposes; ask your health care provider or pharmacist if you have questions. COMMON BRAND NAME(S): Opdivo What should I tell my health care provider before I take this medicine? They need to know if you have any of these conditions:  diabetes  immune system problems  kidney disease  liver disease  lung disease  organ transplant  stomach or intestine problems  thyroid disease  an unusual or allergic reaction to nivolumab, other medicines, foods, dyes, or preservatives  pregnant or trying to get pregnant  breast-feeding How should I use this medicine? This medicine is for infusion into a vein. It is given by a health care professional in a hospital or clinic setting. A special MedGuide will be given to you before each treatment. Be sure to read this information carefully each time. Talk to your pediatrician regarding the use of this medicine in children. While this drug may be prescribed for children as young as 12 years for selected conditions, precautions do apply. Overdosage: If you think you have taken too much of this medicine contact a poison control center or emergency room at once. NOTE: This medicine is only for you. Do not share this medicine with others. What if I miss a dose? It is important not to miss your dose. Call your doctor or health care professional if you are unable to keep an appointment. What may interact with this medicine? Interactions have not been studied. Give your health care provider a list of all the medicines, herbs, non-prescription drugs, or dietary supplements you use. Also tell them if you smoke, drink alcohol, or use illegal drugs.  Some items may interact with your medicine. This list may not describe all possible interactions. Give your health care provider a list of all the medicines, herbs, non-prescription drugs, or dietary supplements you use. Also tell them if you smoke, drink alcohol, or use illegal drugs. Some items may interact with your medicine. What should I watch for while using this medicine? This drug may make you feel generally unwell. Continue your course of treatment even though you feel ill unless your doctor tells you to stop. You may need blood work done while you are taking this medicine. Do not become pregnant while taking this medicine or for 5 months after stopping it. Women should inform their doctor if they wish to become pregnant or think they might be pregnant. There is a potential for serious side effects to an unborn child. Talk to your health care professional or pharmacist for more information. Do not breast-feed an infant while taking this medicine or for 5 months after stopping it. What side effects may I notice from receiving this medicine? Side effects that you should report to your doctor or health care professional as soon as possible:  allergic reactions like skin rash, itching or hives, swelling of the face, lips, or tongue  breathing problems  blood in the urine  bloody or watery diarrhea or black, tarry stools  changes in emotions or moods  changes in vision  chest pain  cough  dizziness  feeling faint or lightheaded, falls  fever, chills  headache with fever, neck stiffness, confusion, loss of memory, sensitivity to light, hallucination, loss of contact with reality, or  seizures  joint pain  mouth sores  redness, blistering, peeling or loosening of the skin, including inside the mouth  severe muscle pain or weakness  signs and symptoms of high blood sugar such as dizziness; dry mouth; dry skin; fruity breath; nausea; stomach pain; increased hunger or thirst;  increased urination  signs and symptoms of kidney injury like trouble passing urine or change in the amount of urine  signs and symptoms of liver injury like dark yellow or brown urine; general ill feeling or flu-like symptoms; light-colored stools; loss of appetite; nausea; right upper belly pain; unusually weak or tired; yellowing of the eyes or skin  swelling of the ankles, feet, hands  trouble passing urine or change in the amount of urine  unusually weak or tired  weight gain or loss Side effects that usually do not require medical attention (report to your doctor or health care professional if they continue or are bothersome):  bone pain  constipation  decreased appetite  diarrhea  muscle pain  nausea, vomiting  tiredness This list may not describe all possible side effects. Call your doctor for medical advice about side effects. You may report side effects to FDA at 1-800-FDA-1088. Where should I keep my medicine? This drug is given in a hospital or clinic and will not be stored at home. NOTE: This sheet is a summary. It may not cover all possible information. If you have questions about this medicine, talk to your doctor, pharmacist, or health care provider.  2020 Else[employer]/Gold Standard (2019-03-03 10:04:50) Oxaliplatin Injection What is this medicine? OXALIPLATIN (ox AL i PLA tin) is a chemotherapy drug. It targets fast dividing cells, like cancer cells, and causes these cells to die. This medicine is used to treat cancers of the colon and rectum, and many other cancers. This medicine may be used for other purposes; ask your health care provider or pharmacist if you have questions. COMMON BRAND NAME(S): Eloxatin What should I tell my health care provider before I take this medicine? They need to know if you have any of these conditions:  heart disease  history of irregular heartbeat  liver disease  low blood counts, like white cells, platelets, or red blood  cells  lung or breathing disease, like asthma  take medicines that treat or prevent blood clots  tingling of the fingers or toes, or other nerve disorder  an unusual or allergic reaction to oxaliplatin, other chemotherapy, other medicines, foods, dyes, or preservatives  pregnant or trying to get pregnant  breast-feeding How should I use this medicine? This drug is given as an infusion into a vein. It is administered in a hospital or clinic by a specially trained health care professional. Talk to your pediatrician regarding the use of this medicine in children. Special care may be needed. Overdosage: If you think you have taken too much of this medicine contact a poison control center or emergency room at once. NOTE: This medicine is only for you. Do not share this medicine with others. What if I miss a dose? It is important not to miss a dose. Call your doctor or health care professional if you are unable to keep an appointment. What may interact with this medicine? Do not take this medicine with any of the following medications:  cisapride  dronedarone  pimozide  thioridazine This medicine may also interact with the following medications:  aspirin and aspirin-like medicines  certain medicines that treat or prevent blood clots like warfarin, apixaban, dabigatran, and rivaroxaban  cisplatin  cyclosporine  diuretics  medicines for infection like acyclovir, adefovir, amphotericin B, bacitracin, cidofovir, foscarnet, ganciclovir, gentamicin, pentamidine, vancomycin  NSAIDs, medicines for pain and inflammation, like ibuprofen or naproxen  other medicines that prolong the QT interval (an abnormal heart rhythm)  pamidronate  zoledronic acid This list may not describe all possible interactions. Give your health care provider a list of all the medicines, herbs, non-prescription drugs, or dietary supplements you use. Also tell them if you smoke, drink alcohol, or use illegal  drugs. Some items may interact with your medicine. What should I watch for while using this medicine? Your condition will be monitored carefully while you are receiving this medicine. You may need blood work done while you are taking this medicine. This medicine may make you feel generally unwell. This is not uncommon as chemotherapy can affect healthy cells as well as cancer cells. Report any side effects. Continue your course of treatment even though you feel ill unless your healthcare professional tells you to stop. This medicine can make you more sensitive to cold. Do not drink cold drinks or use ice. Cover exposed skin before coming in contact with cold temperatures or cold objects. When out in cold weather wear warm clothing and cover your mouth and nose to warm the air that goes into your lungs. Tell your doctor if you get sensitive to the cold. Do not become pregnant while taking this medicine or for 9 months after stopping it. Women should inform their health care professional if they wish to become pregnant or think they might be pregnant. Men should not father a child while taking this medicine and for 6 months after stopping it. There is potential for serious side effects to an unborn child. Talk to your health care professional for more information. Do not breast-feed a child while taking this medicine or for 3 months after stopping it. This medicine has caused ovarian failure in some women. This medicine may make it more difficult to get pregnant. Talk to your health care professional if you are concerned about your fertility. This medicine has caused decreased sperm counts in some men. This may make it more difficult to father a child. Talk to your health care professional if you are concerned about your fertility. This medicine may increase your risk of getting an infection. Call your health care professional for advice if you get a fever, chills, or sore throat, or other symptoms of a cold  or flu. Do not treat yourself. Try to avoid being around people who are sick. Avoid taking medicines that contain aspirin, acetaminophen, ibuprofen, naproxen, or ketoprofen unless instructed by your health care professional. These medicines may hide a fever. Be careful brushing or flossing your teeth or using a toothpick because you may get an infection or bleed more easily. If you have any dental work done, tell your dentist you are receiving this medicine. What side effects may I notice from receiving this medicine? Side effects that you should report to your doctor or health care professional as soon as possible:  allergic reactions like skin rash, itching or hives, swelling of the face, lips, or tongue  breathing problems  cough  low blood counts - this medicine may decrease the number of white blood cells, red blood cells, and platelets. You may be at increased risk for infections and bleeding  nausea, vomiting  pain, redness, or irritation at site where injected  pain, tingling, numbness in the hands or feet  signs and symptoms  of bleeding such as bloody or black, tarry stools; red or dark brown urine; spitting up blood or brown material that looks like coffee grounds; red spots on the skin; unusual bruising or bleeding from the eyes, gums, or nose  signs and symptoms of a dangerous change in heartbeat or heart rhythm like chest pain; dizziness; fast, irregular heartbeat; palpitations; feeling faint or lightheaded; falls  signs and symptoms of infection like fever; chills; cough; sore throat; pain or trouble passing urine  signs and symptoms of liver injury like dark yellow or brown urine; general ill feeling or flu-like symptoms; light-colored stools; loss of appetite; nausea; right upper belly pain; unusually weak or tired; yellowing of the eyes or skin  signs and symptoms of low red blood cells or anemia such as unusually weak or tired; feeling faint or lightheaded;  falls  signs and symptoms of muscle injury like dark urine; trouble passing urine or change in the amount of urine; unusually weak or tired; muscle pain; back pain Side effects that usually do not require medical attention (report to your doctor or health care professional if they continue or are bothersome):  changes in taste  diarrhea  gas  hair loss  loss of appetite  mouth sores This list may not describe all possible side effects. Call your doctor for medical advice about side effects. You may report side effects to FDA at 1-800-FDA-1088. Where should I keep my medicine? This drug is given in a hospital or clinic and will not be stored at home. NOTE: This sheet is a summary. It may not cover all possible information. If you have questions about this medicine, talk to your doctor, pharmacist, or health care provider.  2020 [employer]/Gold Standard (2018-10-01 12:20:35) Fluorouracil, 5-FU injection What is this medicine? FLUOROURACIL, 5-FU (flure oh YOOR a sil) is a chemotherapy drug. It slows the growth of cancer cells. This medicine is used to treat many types of cancer like breast cancer, colon or rectal cancer, pancreatic cancer, and stomach cancer. This medicine may be used for other purposes; ask your health care provider or pharmacist if you have questions. COMMON BRAND NAME(S): Adrucil What should I tell my health care provider before I take this medicine? They need to know if you have any of these conditions:  blood disorders  dihydropyrimidine dehydrogenase (DPD) deficiency  infection (especially a virus infection such as chickenpox, cold sores, or herpes)  kidney disease  liver disease  malnourished, poor nutrition  recent or ongoing radiation therapy  an unusual or allergic reaction to fluorouracil, other chemotherapy, other medicines, foods, dyes, or preservatives  pregnant or trying to get pregnant  breast-feeding How should I use this medicine? This  drug is given as an infusion or injection into a vein. It is administered in a hospital or clinic by a specially trained health care professional. Talk to your pediatrician regarding the use of this medicine in children. Special care may be needed. Overdosage: If you think you have taken too much of this medicine contact a poison control center or emergency room at once. NOTE: This medicine is only for you. Do not share this medicine with others. What if I miss a dose? It is important not to miss your dose. Call your doctor or health care professional if you are unable to keep an appointment. What may interact with this medicine?  allopurinol  cimetidine  dapsone  digoxin  hydroxyurea  leucovorin  levamisole  medicines for seizures like ethotoin, fosphenytoin, phenytoin  medicines  to increase blood counts like filgrastim, pegfilgrastim, sargramostim  medicines that treat or prevent blood clots like warfarin, enoxaparin, and dalteparin  methotrexate  metronidazole  pyrimethamine  some other chemotherapy drugs like busulfan, cisplatin, estramustine, vinblastine  trimethoprim  trimetrexate  vaccines Talk to your doctor or health care professional before taking any of these medicines:  acetaminophen  aspirin  ibuprofen  ketoprofen  naproxen This list may not describe all possible interactions. Give your health care provider a list of all the medicines, herbs, non-prescription drugs, or dietary supplements you use. Also tell them if you smoke, drink alcohol, or use illegal drugs. Some items may interact with your medicine. What should I watch for while using this medicine? Visit your doctor for checks on your progress. This drug may make you feel generally unwell. This is not uncommon, as chemotherapy can affect healthy cells as well as cancer cells. Report any side effects. Continue your course of treatment even though you feel ill unless your doctor tells you to  stop. In some cases, you may be given additional medicines to help with side effects. Follow all directions for their use. Call your doctor or health care professional for advice if you get a fever, chills or sore throat, or other symptoms of a cold or flu. Do not treat yourself. This drug decreases your body's ability to fight infections. Try to avoid being around people who are sick. This medicine may increase your risk to bruise or bleed. Call your doctor or health care professional if you notice any unusual bleeding. Be careful brushing and flossing your teeth or using a toothpick because you may get an infection or bleed more easily. If you have any dental work done, tell your dentist you are receiving this medicine. Avoid taking products that contain aspirin, acetaminophen, ibuprofen, naproxen, or ketoprofen unless instructed by your doctor. These medicines may hide a fever. Do not become pregnant while taking this medicine. Women should inform their doctor if they wish to become pregnant or think they might be pregnant. There is a potential for serious side effects to an unborn child. Talk to your health care professional or pharmacist for more information. Do not breast-feed an infant while taking this medicine. Men should inform their doctor if they wish to father a child. This medicine may lower sperm counts. Do not treat diarrhea with over the counter products. Contact your doctor if you have diarrhea that lasts more than 2 days or if it is severe and watery. This medicine can make you more sensitive to the sun. Keep out of the sun. If you cannot avoid being in the sun, wear protective clothing and use sunscreen. Do not use sun lamps or tanning beds/booths. What side effects may I notice from receiving this medicine? Side effects that you should report to your doctor or health care professional as soon as possible:  allergic reactions like skin rash, itching or hives, swelling of the face,  lips, or tongue  low blood counts - this medicine may decrease the number of white blood cells, red blood cells and platelets. You may be at increased risk for infections and bleeding.  signs of infection - fever or chills, cough, sore throat, pain or difficulty passing urine  signs of decreased platelets or bleeding - bruising, pinpoint red spots on the skin, black, tarry stools, blood in the urine  signs of decreased red blood cells - unusually weak or tired, fainting spells, lightheadedness  breathing problems  changes in vision  chest pain  mouth sores  nausea and vomiting  pain, swelling, redness at site where injected  pain, tingling, numbness in the hands or feet  redness, swelling, or sores on hands or feet  stomach pain  unusual bleeding Side effects that usually do not require medical attention (report to your doctor or health care professional if they continue or are bothersome):  changes in finger or toe nails  diarrhea  dry or itchy skin  hair loss  headache  loss of appetite  sensitivity of eyes to the light  stomach upset  unusually teary eyes This list may not describe all possible side effects. Call your doctor for medical advice about side effects. You may report side effects to FDA at 1-800-FDA-1088. Where should I keep my medicine? This drug is given in a hospital or clinic and will not be stored at home. NOTE: This sheet is a summary. It may not cover all possible information. If you have questions about this medicine, talk to your doctor, pharmacist, or health care provider.  2020 Elsevier/Gold Standard (2007-09-17 13:53:16) Leucovorin injection What is this medicine? LEUCOVORIN (loo koe VOR in) is used to prevent or treat the harmful effects of some medicines. This medicine is used to treat anemia caused by a low amount of folic acid in the body. It is also used with 5-fluorouracil (5-FU) to treat colon cancer. This medicine may be used  for other purposes; ask your health care provider or pharmacist if you have questions. What should I tell my health care provider before I take this medicine? They need to know if you have any of these conditions:  anemia from low levels of vitamin B-12 in the blood  an unusual or allergic reaction to leucovorin, folic acid, other medicines, foods, dyes, or preservatives  pregnant or trying to get pregnant  breast-feeding How should I use this medicine? This medicine is for injection into a muscle or into a vein. It is given by a health care professional in a hospital or clinic setting. Talk to your pediatrician regarding the use of this medicine in children. Special care may be needed. Overdosage: If you think you have taken too much of this medicine contact a poison control center or emergency room at once. NOTE: This medicine is only for you. Do not share this medicine with others. What if I miss a dose? This does not apply. What may interact with this medicine?  capecitabine  fluorouracil  phenobarbital  phenytoin  primidone  trimethoprim-sulfamethoxazole This list may not describe all possible interactions. Give your health care provider a list of all the medicines, herbs, non-prescription drugs, or dietary supplements you use. Also tell them if you smoke, drink alcohol, or use illegal drugs. Some items may interact with your medicine. What should I watch for while using this medicine? Your condition will be monitored carefully while you are receiving this medicine. This medicine may increase the side effects of 5-fluorouracil, 5-FU. Tell your doctor or health care professional if you have diarrhea or mouth sores that do not get better or that get worse. What side effects may I notice from receiving this medicine? Side effects that you should report to your doctor or health care professional as soon as possible:  allergic reactions like skin rash, itching or hives, swelling  of the face, lips, or tongue  breathing problems  fever, infection  mouth sores  unusual bleeding or bruising  unusually weak or tired Side effects that usually do not require  medical attention (report to your doctor or health care professional if they continue or are bothersome):  constipation or diarrhea  loss of appetite  nausea, vomiting This list may not describe all possible side effects. Call your doctor for medical advice about side effects. You may report side effects to FDA at 1-800-FDA-1088. Where should I keep my medicine? This drug is given in a hospital or clinic and will not be stored at home. NOTE: This sheet is a summary. It may not cover all possible information. If you have questions about this medicine, talk to your doctor, pharmacist, or health care provider.  2020 Elsevier/Gold Standard (2007-11-18 16:50:29)

## 2020-03-17 ENCOUNTER — Other Ambulatory Visit: Payer: Self-pay

## 2020-03-17 ENCOUNTER — Inpatient Hospital Stay: Payer: Medicaid Other

## 2020-03-17 ENCOUNTER — Inpatient Hospital Stay: Payer: Medicaid Other | Admitting: Nurse Practitioner

## 2020-03-17 DIAGNOSIS — C159 Malignant neoplasm of esophagus, unspecified: Secondary | ICD-10-CM

## 2020-03-17 MED ORDER — LIDOCAINE-PRILOCAINE 2.5-2.5 % EX CREA: TOPICAL_CREAM | CUTANEOUS | 3 refills | Status: DC

## 2020-03-21 ENCOUNTER — Encounter: Payer: Self-pay | Admitting: Oncology

## 2020-03-21 ENCOUNTER — Encounter: Payer: Self-pay | Admitting: Family Medicine

## 2020-03-21 ENCOUNTER — Ambulatory Visit: Payer: Medicaid Other | Attending: Family Medicine | Admitting: Family Medicine

## 2020-03-21 ENCOUNTER — Other Ambulatory Visit: Payer: Self-pay

## 2020-03-21 VITALS — BP 102/74 | HR 104 | Temp 97.9°F | Wt 98.5 lb

## 2020-03-21 DIAGNOSIS — D509 Iron deficiency anemia, unspecified: Secondary | ICD-10-CM

## 2020-03-21 DIAGNOSIS — Z934 Other artificial openings of gastrointestinal tract status: Secondary | ICD-10-CM

## 2020-03-21 DIAGNOSIS — K222 Esophageal obstruction: Secondary | ICD-10-CM

## 2020-03-21 DIAGNOSIS — C159 Malignant neoplasm of esophagus, unspecified: Secondary | ICD-10-CM

## 2020-03-21 DIAGNOSIS — E43 Unspecified severe protein-calorie malnutrition: Secondary | ICD-10-CM

## 2020-03-21 MED ORDER — FENTANYL 25 MCG/HR TD PT72: 1.0000 | MEDICATED_PATCH | TRANSDERMAL | 0 refills | Status: DC

## 2020-03-21 NOTE — Progress Notes (Signed)
2X WEEK BACK PAIN

## 2020-03-21 NOTE — Progress Notes (Signed)
New Patient Office Visit  Subjective:  Patient ID: Andrew Pratt, male    DOB: January 10, 1956  Age: 64 y.o. MRN: [id number]  CC:  Chief Complaint  Patient presents with  . 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  Patient reports that he is having significant mid back pain that is a 10 on a 0-to-10 scale.  He reports no prior injury to the back.  He would like pain medication.  He reports that he has been using his feeding tube for his medications as well as nutritional supplements.  He reports that he has also been trying to have sips of liquid or soft foods in his mouth.  He does have occasional cough but denies any increased shortness of breath and he feels better than he did prior to hospitalization.  He reports that he had had increasing difficulty with swallowing as well as weight loss for more than a month prior to hospitalization but when he felt that he was completely unable to swallow, he went to the emergency department for further evaluation.  He reports history of daily alcohol consumption as well as smoking.  He reports that he did not seek medical attention on a regular basis prior to his recent hospitalization.  Past Medical History:  Diagnosis Date  . Cancer [facility])     Past Surgical History:  Procedure Laterality Date  . ESOPHAGOGASTRODUODENOSCOPY (EGD) WITH PROPOFOL N/A 03/02/2020   Procedure: ESOPHAGOGASTRODUODENOSCOPY (EGD) WITH PROPOFOL;  Surgeon: Lin Landsman, MD;  Location: [place];  Service: Gastroenterology;  Laterality: N/A;  . MASS BIOPSY N/A 03/08/2020   Procedure: NECK MASS BIOPSY;  Surgeon: Jules Husbands, MD;  Location: [facility];  Service: General;  Laterality: N/A;  . PORTACATH PLACEMENT N/A 03/08/2020    Procedure: INSERTION PORT-A-CATH;  Surgeon: Jules Husbands, MD;  Location: [facility];  Service: General;  Laterality: N/A;    Family History  Problem Relation Age of Onset  . Cancer Mother        lung    Social History   Socioeconomic History  . Marital status: Single    Spouse name: Not on file  . Number of children: Not on file  . Years of education: Not on file  . Highest education level: Not on file  Occupational History  . Not on file  Tobacco Use  . Smoking status: Former Smoker    Packs/day: 0.50    Types: Cigarettes  . Smokeless tobacco: Never Used  . Tobacco comment: QUIT 3 WKS AGO  Vaping Use  . Vaping Use: Never used  Substance and Sexual Activity  . Alcohol use: Yes    Alcohol/week: 7.0 standard drinks    Types: 7 Cans of beer per week  . Drug use: Not Currently  . Sexual activity: Not on file  Other Topics Concern  . Not on file  Social History Narrative  . Not on file   Social Determinants of Health   Financial Resource Strain:   . Difficulty of Paying Living Expenses: Not on file  Food Insecurity:   . Worried About Charity fundraiser in the Last Year: Not on file  . Ran Out of Food in the Last Year: Not on file  Transportation Needs:   . Lack of Transportation (Medical): Not on  file  . Lack of Transportation (Non-Medical): Not on file  Physical Activity:   . Days of Exercise per Week: Not on file  . Minutes of Exercise per Session: Not on file  Stress:   . Feeling of Stress : Not on file  Social Connections:   . Frequency of Communication with Friends and Family: Not on file  . Frequency of Social Gatherings with Friends and Family: Not on file  . Attends Religious Services: Not on file  . Active Member of Clubs or Organizations: Not on file  . Attends Archivist Meetings: Not on file  . Marital Status: Not on file  Intimate Partner Violence:   . Fear of Current or Ex-Partner: Not on file  . Emotionally Abused: Not on file  .  Physically Abused: Not on file  . Sexually Abused: Not on file    ROS Review of Systems  Constitutional: Positive for fatigue. Negative for chills and fever.  HENT: Positive for trouble swallowing and voice change.   Eyes: Negative for photophobia and visual disturbance.  Respiratory: Positive for cough. Negative for shortness of breath.   Cardiovascular: Negative for chest pain and palpitations.  Gastrointestinal: Positive for abdominal pain. Negative for blood in stool, constipation, diarrhea and nausea.  Endocrine: Negative for polydipsia, polyphagia and polyuria.       Dry mouth   Genitourinary: Negative for dysuria and frequency.  Musculoskeletal: Positive for back pain. Negative for arthralgias.  Skin: Negative for rash and wound.  Neurological: Positive for dizziness and light-headedness. Negative for headaches.  Hematological: Negative for adenopathy. Does not bruise/bleed easily.  Psychiatric/Behavioral: Negative for suicidal ideas. The patient is nervous/anxious.     Objective:   Today's Vitals: BP 102/74 (BP Location: Left Arm, Patient Position: Sitting)   Pulse (!) 104   Temp 97.9 F (36.6 C)   Wt 98 lb 8 oz (44.7 kg)   SpO2 94%   BMI 18.61 kg/m   Physical Exam Vitals and nursing note reviewed.  Constitutional:      General: He is not in acute distress.    Comments: Thin, cachectic appearing male with temporal wasting sitting on a chair in exam room in no acute distress  Cardiovascular:     Rate and Rhythm: Normal rate and regular rhythm.  Pulmonary:     Effort: Pulmonary effort is normal.     Breath sounds: Normal breath sounds.  Abdominal:     Palpations: Abdomen is soft.     Tenderness: There is no abdominal tenderness. There is no guarding or rebound.     Comments: Presence of feeding tube; no signs of leakage or irritation/inflammation at tube insertion site  Musculoskeletal:        General: No tenderness (No reproducible back pain on examination).      Cervical back: No tenderness.     Right lower leg: No edema.     Left lower leg: No edema.  Lymphadenopathy:     Cervical: No cervical adenopathy.  Skin:    General: Skin is warm and dry.  Neurological:     General: No focal deficit present.     Mental Status: He is alert and oriented to person, place, and time.  Psychiatric:        Mood and Affect: Mood normal.        Behavior: Behavior normal.     Assessment & Plan:  1. Primary cancer of esophagus with metastasis to other site [facility]); Hospital discharge follow-up; protein calorie malnutrition-severe;  esophageal obstruction; microcytic anemia; presence of jejunostomy tube Notes from patient's recent hospitalization were reviewed and discussed with the patient at today's visit.  Patient is status post hospitalization from 03/05/2020 through 03/12/2020 after presenting due to weight loss and difficulty swallowing.  Patient was diagnosed with primary cancer of the esophagus with metastasis to other site/malignant neoplasm of lower third of the esophagus.  Patient also had aspiration pneumonia.  At today's visit, discussed with patient that he should avoid any p.o. intake as this could increase the risk of re-development of aspiration pneumonia and discussed the use of moisturizer swabs/rinsing of the mouth to help with mouth dryness/oral hygiene but avoid swallowing any food or liquids.  Patient also has complaint of pain in the mid back however no actual back pain on palpation and this pain is most likely related to his esophageal cancer.  Prescription was sent to outside pharmacy for Duragesic patch as patient is unable to have p.o. intake.  He does have appointment tomorrow with oncology.  He is unsure if he can afford the Duragesic patch but will go to pharmacy today to see if he can obtain the Duragesic patch.  If he is unable to obtain the patch, he is encouraged to discuss other pain control options that he can use per G-tube with oncology.   Continue use of high-protein supplements via G-tube due to protein calorie malnutrition as patient has been unable to obtain any significant nutrition prior to hospitalization due to his esophageal obstruction.  Discussed comprehensive metabolic panel and follow-up of malnutrition and electrolyte follow-up status post hospitalization as well as CBC in follow-up of microcytic anemia and patient wishes to wait until he is oncology appointment tomorrow to have blood work done. - fentaNYL (DURAGESIC) 25 MCG/HR; Place 1 patch onto the skin every 3 (three) days. To help with pain (Patient not taking: Reported on 04/05/2020)  Dispense: 5 patch; Refill: 0    Outpatient Encounter Medications as of 03/21/2020  Medication Sig  . acetaminophen (TYLENOL) 325 MG tablet Place 2 tablets (650 mg total) into feeding tube every 6 (six) hours as needed for mild pain (or Fever >/= 101). (Patient not taking: Reported on 03/21/2020)  . albuterol (VENTOLIN HFA) 108 (90 Base) MCG/ACT inhaler Inhale 2 puffs into the lungs every 6 (six) hours as needed for wheezing or shortness of breath. (Patient not taking: Reported on 03/21/2020)  . lidocaine-prilocaine (EMLA) cream Apply to affected area once (Patient not taking: Reported on 03/21/2020)  . metoCLOPramide (REGLAN) 5 MG/5ML solution Take 5 mLs (5 mg total) by mouth 4 (four) times daily -  before meals and at bedtime. (Patient not taking: Reported on 03/21/2020)  . Morphine Sulfate (MORPHINE CONCENTRATE) 10 MG/0.5ML SOLN concentrated solution Take 0.25 mLs (5 mg total) by mouth every 4 (four) hours as needed for severe pain. (Patient not taking: Reported on 03/21/2020)  . nicotine (NICODERM CQ - DOSED IN MG/24 HOURS) 21 mg/24hr patch One 21mg  patch chest wall daily (okay to substitute generic) (Patient not taking: Reported on 03/21/2020)  . Nutritional Supplements (FEEDING SUPPLEMENT, OSMOLITE 1.5 CAL,) LIQD Place 237 mLs into feeding tube 5 (five) times daily. (Patient not  taking: Reported on 03/21/2020)  . ondansetron (ZOFRAN ODT) 4 MG disintegrating tablet Take 1 tablet (4 mg total) by mouth every 8 (eight) hours as needed for nausea or vomiting. (Patient not taking: Reported on 03/21/2020)  . Water For Irrigation, Sterile (FREE WATER) SOLN Place 100 mLs into feeding tube 5 (five) times daily. (Patient  not taking: Reported on 03/21/2020)   No facility-administered encounter medications on file as of 03/21/2020.    Follow-up: Return for keep f/u with Oncology; return as needed or 2-3 weeks.   Antony Blackbird, MD

## 2020-03-22 ENCOUNTER — Inpatient Hospital Stay: Payer: Medicaid Other

## 2020-03-22 ENCOUNTER — Encounter: Payer: Self-pay | Admitting: Oncology

## 2020-03-22 ENCOUNTER — Inpatient Hospital Stay (HOSPITAL_BASED_OUTPATIENT_CLINIC_OR_DEPARTMENT_OTHER): Payer: Medicaid Other | Admitting: Oncology

## 2020-03-22 ENCOUNTER — Other Ambulatory Visit: Payer: Self-pay | Admitting: *Deleted

## 2020-03-22 VITALS — BP 93/82 | HR 116 | Temp 96.2°F | Resp 16 | Wt 93.4 lb

## 2020-03-22 DIAGNOSIS — D509 Iron deficiency anemia, unspecified: Secondary | ICD-10-CM

## 2020-03-22 DIAGNOSIS — Z5111 Encounter for antineoplastic chemotherapy: Secondary | ICD-10-CM

## 2020-03-22 DIAGNOSIS — C159 Malignant neoplasm of esophagus, unspecified: Secondary | ICD-10-CM

## 2020-03-22 DIAGNOSIS — K5909 Other constipation: Secondary | ICD-10-CM

## 2020-03-22 DIAGNOSIS — Z7189 Other specified counseling: Secondary | ICD-10-CM

## 2020-03-22 DIAGNOSIS — Z5112 Encounter for antineoplastic immunotherapy: Secondary | ICD-10-CM

## 2020-03-22 LAB — COMPREHENSIVE METABOLIC PANEL
ALT: 15 U/L (ref 0–44)
AST: 17 U/L (ref 15–41)
Albumin: 3.6 g/dL (ref 3.5–5.0)
Alkaline Phosphatase: 99 U/L (ref 38–126)
Anion gap: 9 (ref 5–15)
BUN: 41 mg/dL — ABNORMAL HIGH (ref 8–23)
CO2: 28 mmol/L (ref 22–32)
Calcium: 9.6 mg/dL (ref 8.9–10.3)
Chloride: 100 mmol/L (ref 98–111)
Creatinine, Ser: 1.03 mg/dL (ref 0.61–1.24)
GFR, Estimated: >60 mL/min (ref >60)
Glucose, Bld: 170 mg/dL — ABNORMAL HIGH (ref 70–99)
Potassium: 3.6 mmol/L (ref 3.5–5.1)
Sodium: 137 mmol/L (ref 135–145)
Total Bilirubin: 0.4 mg/dL (ref 0.3–1.2)
Total Protein: 8.8 g/dL — ABNORMAL HIGH (ref 6.5–8.1)

## 2020-03-22 LAB — CBC WITH DIFFERENTIAL/PLATELET
Abs Immature Granulocytes: 0.18 10*3/uL — ABNORMAL HIGH (ref 0.00–0.07)
Basophils Absolute: 0.1 10*3/uL (ref 0.0–0.1)
Basophils Relative: 0 %
Eosinophils Absolute: 0.1 10*3/uL (ref 0.0–0.5)
Eosinophils Relative: 0 %
HCT: 36.4 % — ABNORMAL LOW (ref 39.0–52.0)
Hemoglobin: 11.6 g/dL — ABNORMAL LOW (ref 13.0–17.0)
Immature Granulocytes: 1 %
Lymphocytes Relative: 9 %
Lymphs Abs: 1.7 10*3/uL (ref 0.7–4.0)
MCH: 25.1 pg — ABNORMAL LOW (ref 26.0–34.0)
MCHC: 31.9 g/dL (ref 30.0–36.0)
MCV: 78.6 fL — ABNORMAL LOW (ref 80.0–100.0)
Monocytes Absolute: 1 10*3/uL (ref 0.1–1.0)
Monocytes Relative: 5 %
Neutro Abs: 15.6 10*3/uL — ABNORMAL HIGH (ref 1.7–7.7)
Neutrophils Relative %: 85 %
Platelets: 785 10*3/uL — ABNORMAL HIGH (ref 150–400)
RBC: 4.63 MIL/uL (ref 4.22–5.81)
RDW: 17.7 % — ABNORMAL HIGH (ref 11.5–15.5)
WBC: 18.5 10*3/uL — ABNORMAL HIGH (ref 4.0–10.5)
nRBC: 0 % (ref 0.0–0.2)

## 2020-03-22 LAB — TSH: TSH: 1.625 u[IU]/mL (ref 0.350–4.500)

## 2020-03-22 MED ORDER — DEXTROSE 5 % IV SOLN
Freq: Once | INTRAVENOUS | Status: AC
  Filled 2020-03-22: qty 250

## 2020-03-22 MED ORDER — SODIUM CHLORIDE 0.9 % IV SOLN
2400.0000 mg/m2 | INTRAVENOUS | Status: DC
  Administered 2020-03-22: 3300 mg via INTRAVENOUS
  Filled 2020-03-22: qty 66

## 2020-03-22 MED ORDER — LIDOCAINE-PRILOCAINE 2.5-2.5 % EX CREA: TOPICAL_CREAM | CUTANEOUS | 3 refills | Status: DC

## 2020-03-22 MED ORDER — FLUOROURACIL CHEMO INJECTION 2.5 GM/50ML
400.0000 mg/m2 | Freq: Once | INTRAVENOUS | Status: AC
  Administered 2020-03-22: 550 mg via INTRAVENOUS
  Filled 2020-03-22: qty 11

## 2020-03-22 MED ORDER — LEUCOVORIN CALCIUM INJECTION 350 MG
550.0000 mg | Freq: Once | INTRAVENOUS | Status: AC
  Administered 2020-03-22: 550 mg via INTRAVENOUS
  Filled 2020-03-22: qty 17.5

## 2020-03-22 MED ORDER — PALONOSETRON HCL INJECTION 0.25 MG/5ML
0.2500 mg | Freq: Once | INTRAVENOUS | Status: AC
  Administered 2020-03-22: 0.25 mg via INTRAVENOUS
  Filled 2020-03-22: qty 5

## 2020-03-22 MED ORDER — SODIUM CHLORIDE 0.9% FLUSH
10.0000 mL | Freq: Once | INTRAVENOUS | Status: AC
  Administered 2020-03-22: 10 mL via INTRAVENOUS
  Filled 2020-03-22: qty 10

## 2020-03-22 MED ORDER — OXALIPLATIN CHEMO INJECTION 100 MG/20ML
85.0000 mg/m2 | Freq: Once | INTRAVENOUS | Status: AC
  Administered 2020-03-22: 115 mg via INTRAVENOUS
  Filled 2020-03-22: qty 20

## 2020-03-22 MED ORDER — SODIUM CHLORIDE 0.9 % IV SOLN
Freq: Once | INTRAVENOUS | Status: AC
  Filled 2020-03-22: qty 250

## 2020-03-22 MED ORDER — SODIUM CHLORIDE 0.9 % IV SOLN
10.0000 mg | Freq: Once | INTRAVENOUS | Status: AC
  Administered 2020-03-22: 10 mg via INTRAVENOUS
  Filled 2020-03-22: qty 10

## 2020-03-22 MED ORDER — SODIUM CHLORIDE 0.9 % IV SOLN: 480.0000 mg | Freq: Once | INTRAVENOUS | Status: DC

## 2020-03-22 MED ORDER — BISACODYL 10 MG RE SUPP
10.0000 mg | Freq: Once | RECTAL | Status: AC
  Administered 2020-03-22: 10 mg via RECTAL
  Filled 2020-03-22: qty 1

## 2020-03-22 NOTE — Progress Notes (Signed)
HR 118, per Dr. Janese Banks proceed with treatment as scheduled.    Pt tolerated infusion well, no s/s of distress or reaction noted. Pump education performed with patient and family, pt verbalizes understanding. Pt stable at discharge.

## 2020-03-22 NOTE — Telephone Encounter (Signed)
The pt is living with family member and the emlz cream needs to be sent to pharmacy in [place]

## 2020-03-23 ENCOUNTER — Other Ambulatory Visit: Payer: Self-pay

## 2020-03-23 ENCOUNTER — Encounter (HOSPITAL_COMMUNITY)
Admission: RE | Admit: 2020-03-23 | Discharge: 2020-03-23 | Disposition: A | Discharge status: Home / Self Care | Payer: Medicaid Other | Source: Ambulatory Visit | Attending: Oncology | Admitting: Oncology

## 2020-03-23 ENCOUNTER — Telehealth: Payer: Self-pay

## 2020-03-23 DIAGNOSIS — C155 Malignant neoplasm of lower third of esophagus: Secondary | ICD-10-CM | POA: Diagnosis not present

## 2020-03-23 LAB — T4: T4, Total: 12.9 ug/dL — ABNORMAL HIGH (ref 4.5–12.0)

## 2020-03-23 LAB — GLUCOSE, CAPILLARY: Glucose-Capillary: 108 mg/dL — ABNORMAL HIGH (ref 70–99)

## 2020-03-23 MED ORDER — FLUDEOXYGLUCOSE F - 18 (FDG) INJECTION: 5.0000 | Freq: Once | INTRAVENOUS | Status: DC | PRN

## 2020-03-23 NOTE — Telephone Encounter (Signed)
T/C to pt for follow up after receiving first infusion yesterday.  No answer but left message stating we were calling to check on him and to call for any questions or concerns.

## 2020-03-23 NOTE — Progress Notes (Signed)
The following Medication: Shirlean Kelly has been approved thru [employer]. Approved for 2 doses on 03/23/2020.  Assistance ID: [id number] . Medication is ordered as Assistance to have on hand prior to treatment. First DOS: 03/24/2020  Next DOS:04/07/2020 **additional doses, require Patient Financials to be for processing.

## 2020-03-24 ENCOUNTER — Inpatient Hospital Stay: Payer: Medicaid Other

## 2020-03-24 ENCOUNTER — Other Ambulatory Visit: Payer: Self-pay | Admitting: Oncology

## 2020-03-24 VITALS — BP 96/71 | HR 100 | Temp 99.0°F | Resp 20

## 2020-03-24 DIAGNOSIS — D508 Other iron deficiency anemias: Secondary | ICD-10-CM

## 2020-03-24 DIAGNOSIS — D509 Iron deficiency anemia, unspecified: Secondary | ICD-10-CM | POA: Insufficient documentation

## 2020-03-24 DIAGNOSIS — C159 Malignant neoplasm of esophagus, unspecified: Secondary | ICD-10-CM

## 2020-03-24 DIAGNOSIS — Z5111 Encounter for antineoplastic chemotherapy: Secondary | ICD-10-CM | POA: Diagnosis not present

## 2020-03-24 MED ORDER — SODIUM CHLORIDE 0.9% FLUSH
10.0000 mL | INTRAVENOUS | Status: DC | PRN
  Administered 2020-03-24: 10 mL
  Filled 2020-03-24: qty 10

## 2020-03-24 MED ORDER — HEPARIN SOD (PORK) LOCK FLUSH 100 UNIT/ML IV SOLN
500.0000 [IU] | Freq: Once | INTRAVENOUS | Status: AC | PRN
  Administered 2020-03-24: 500 [IU]
  Filled 2020-03-24: qty 5

## 2020-03-24 MED ORDER — HEPARIN SOD (PORK) LOCK FLUSH 100 UNIT/ML IV SOLN
INTRAVENOUS | Status: AC
  Filled 2020-03-24: qty 5

## 2020-03-24 MED ORDER — SODIUM CHLORIDE 0.9 % IV SOLN
510.0000 mg | Freq: Once | INTRAVENOUS | Status: AC
  Administered 2020-03-24: 510 mg via INTRAVENOUS
  Filled 2020-03-24: qty 17

## 2020-03-24 MED ORDER — SODIUM CHLORIDE 0.9 % IV SOLN
Freq: Once | INTRAVENOUS | Status: AC
  Filled 2020-03-24: qty 250

## 2020-03-25 NOTE — Progress Notes (Signed)
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 Marius Ditch as an outpatient for symptoms of dysphagia. He underwent EGD on 03/02/2020 which showed a large fungating and ulcerating mass with bleeding and stigmata of recent bleeding in the distal esophagus 30 cm from the incisors. The degree of obstruction was severe enough that the scope could not be passed beyond the distal esophagus. Patient was supposed to see me today as an outpatient but then got admitted to the hospital with severe dysphagia to the point that he was not able to swallow anything and had a PEG tube placement.  CT chest abdomen pelvis with contrast  shows a large irregular ulcerated mass in the distal third of the esophagus with proximal esophagus being dilated and fluid-filled. Bilateral paratracheal and mediastinal adenopathy noted. Also found to have a 2.6 cm right supraclavicular lymph node no evidence of liver or other organ metastases.  Supraclavicular lymph node was biopsied and was consistent with squamous cell carcinoma  Plan is for palliative FOLFOX Opdivo treatment along with palliative radiation to the lower esophageal mass   Interval history-patient reports feeling constipated.  He has been trying over-the-counter laxatives which have not been helping him much.  He has to constantly spit up his saliva as he is not able to swallow anything.  Reports ongoing  fatigue  ECOG PS- 2 Pain scale- 0 Opioid associated constipation- no  Review of systems- Review of Systems  Constitutional: Positive for malaise/fatigue. Negative for chills, fever and weight loss.  HENT: Negative for congestion, ear discharge and nosebleeds.   Eyes: Negative for blurred vision.  Respiratory: Negative for cough, hemoptysis, sputum production, shortness of breath and wheezing.   Cardiovascular: Negative for chest pain, palpitations, orthopnea and claudication.  Gastrointestinal: Positive for constipation. Negative for abdominal pain, blood in stool, diarrhea, heartburn, melena, nausea and vomiting.       Inability to swallow  Genitourinary: Negative for dysuria, flank pain, frequency, hematuria and urgency.  Musculoskeletal: Negative for back pain, joint pain and myalgias.  Skin: Negative for rash.  Neurological: Negative for dizziness, tingling, focal weakness, seizures, weakness and headaches.  Endo/Heme/Allergies: Does not bruise/bleed easily.  Psychiatric/Behavioral: Negative for depression and suicidal ideas. The patient does not have insomnia.        No Known Allergies   Past Medical History:  Diagnosis Date  . [facility])      Past Surgical History:  Procedure Laterality Date  . ESOPHAGOGASTRODUODENOSCOPY (EGD) WITH PROPOFOL N/A 03/02/2020   Procedure: ESOPHAGOGASTRODUODENOSCOPY (EGD) WITH PROPOFOL;  Surgeon: Lin Landsman, MD;  Location: [place];  Service: Gastroenterology;  Laterality: N/A;  . MASS BIOPSY N/A 03/08/2020   Procedure: NECK MASS BIOPSY;  Surgeon: Jules Husbands, MD;  Location: [facility];  Service: General;  Laterality: N/A;  . PORTACATH PLACEMENT N/A 03/08/2020   Procedure: INSERTION PORT-A-CATH;  Surgeon: Jules Husbands, MD;  Location: [facility];  Service: General;  Laterality: N/A;    Social History   Socioeconomic  History  . Marital status: Single    Spouse name: Not on file  . Number of children: Not on file  .  Years of education: Not on file  . Highest education level: Not on file  Occupational History  . Not on file  Tobacco Use  . Smoking status: Former Smoker    Packs/day: 0.50    Types: Cigarettes  . Smokeless tobacco: Never Used  . Tobacco comment: QUIT 3 WKS AGO  Vaping Use  . Vaping Use: Never used  Substance and Sexual Activity  . Alcohol use: Yes    Alcohol/week: 7.0 standard drinks    Types: 7 Cans of beer per week  . Drug use: Not Currently  . Sexual activity: Not on file  Other Topics Concern  . Not on file  Social History Narrative  . Not on file   Social Determinants of Health   Financial Resource Strain:   . Difficulty of Paying Living Expenses: Not on file  Food Insecurity:   . Worried About Charity fundraiser in the Last Year: Not on file  . Ran Out of Food in the Last Year: Not on file  Transportation Needs:   . Lack of Transportation (Medical): Not on file  . Lack of Transportation (Non-Medical): Not on file  Physical Activity:   . Days of Exercise per Week: Not on file  . Minutes of Exercise per Session: Not on file  Stress:   . Feeling of Stress : Not on file  Social Connections:   . Frequency of Communication with Friends and Family: Not on file  . Frequency of Social Gatherings with Friends and Family: Not on file  . Attends Religious Services: Not on file  . Active Member of Clubs or Organizations: Not on file  . Attends Archivist Meetings: Not on file  . Marital Status: Not on file  Intimate Partner Violence:   . Fear of Current or Ex-Partner: Not on file  . Emotionally Abused: Not on file  . Physically Abused: Not on file  . Sexually Abused: Not on file    Family History  Problem Relation Age of Onset  . Cancer Mother        lung     Current Outpatient Medications:  .  fentaNYL (DURAGESIC) 25 MCG/HR, Place 1 patch onto the skin every 3 (three) days. To help with pain, Disp: 5 patch, Rfl: 0 .  acetaminophen (TYLENOL) 325 MG  tablet, Place 2 tablets (650 mg total) into feeding tube every 6 (six) hours as needed for mild pain (or Fever >/= 101). (Patient not taking: Reported on 03/21/2020), Disp: , Rfl:  .  albuterol (VENTOLIN HFA) 108 (90 Base) MCG/ACT inhaler, Inhale 2 puffs into the lungs every 6 (six) hours as needed for wheezing or shortness of breath. (Patient not taking: Reported on 03/21/2020), Disp: 8 g, Rfl: 0 .  lidocaine-prilocaine (EMLA) cream, Apply to affected area once, Disp: 30 g, Rfl: 3 .  metoCLOPramide (REGLAN) 5 MG/5ML solution, Take 5 mLs (5 mg total) by mouth 4 (four) times daily -  before meals and at bedtime. (Patient not taking: Reported on 03/21/2020), Disp: 600 mL, Rfl: 0 .  Morphine Sulfate (MORPHINE CONCENTRATE) 10 MG/0.5ML SOLN concentrated solution, Take 0.25 mLs (5 mg total) by mouth every 4 (four) hours as needed for severe pain. (Patient not taking: Reported on 03/21/2020), Disp: 30 mL, Rfl: 0 .  nicotine (NICODERM CQ - DOSED IN MG/24 HOURS) 21 mg/24hr patch, One  $'21mg'V$  patch chest wall daily (okay to substitute generic) (Patient not taking: Reported on 03/21/2020), Disp: 28 patch, Rfl: 0 .  Nutritional Supplements (FEEDING SUPPLEMENT, OSMOLITE 1.5 CAL,) LIQD, Place 237 mLs into feeding tube 5 (five) times daily. (Patient not taking: Reported on 03/21/2020), Disp: , Rfl: 0 .  ondansetron (ZOFRAN ODT) 4 MG disintegrating tablet, Take 1 tablet (4 mg total) by mouth every 8 (eight) hours as needed for nausea or vomiting. (Patient not taking: Reported on 03/21/2020), Disp: 20 tablet, Rfl: 0 .  Water For Irrigation, Sterile (FREE WATER) SOLN, Place 100 mLs into feeding tube 5 (five) times daily. (Patient not taking: Reported on 03/21/2020), Disp: 15000 mL, Rfl: 0 No current facility-administered medications for this visit.  Facility-Administered Medications Ordered in Other Visits:  .  fludeoxyglucose F - 18 (FDG) injection 5 millicurie, 5 millicurie, Intravenous, Once PRN, Felipa Emory,  MD  Physical exam:  Vitals:   03/22/20 0925  BP: 93/82  Pulse: (!) 116  Resp: 16  Temp: (!) 96.2 F (35.7 C)  TempSrc: Tympanic  SpO2: 100%  Weight: 93 lb 6.4 oz (42.4 kg)   Physical Exam Constitutional:      Comments: Thin cachectic gentleman in no acute distress  Cardiovascular:     Rate and Rhythm: Regular rhythm. Tachycardia present.     Heart sounds: Normal heart sounds.  Pulmonary:     Effort: Pulmonary effort is normal.     Breath sounds: Normal breath sounds.  Abdominal:     General: Bowel sounds are normal.     Palpations: Abdomen is soft.     Comments: PEG tube in place  Skin:    General: Skin is warm and dry.  Neurological:     Mental Status: He is alert and oriented to person, place, and time.      CMP Latest Ref Rng & Units 03/22/2020  Glucose 70 - 99 mg/dL 170(H)  BUN 8 - 23 mg/dL 41(H)  Creatinine 0.61 - 1.24 mg/dL 1.03  Sodium 135 - 145 mmol/L 137  Potassium 3.5 - 5.1 mmol/L 3.6  Chloride 98 - 111 mmol/L 100  CO2 22 - 32 mmol/L 28  Calcium 8.9 - 10.3 mg/dL 9.6  Total Protein 6.5 - 8.1 g/dL 8.8(H)  Total Bilirubin 0.3 - 1.2 mg/dL 0.4  Alkaline Phos 38 - 126 U/L 99  AST 15 - 41 U/L 17  ALT 0 - 44 U/L 15   CBC Latest Ref Rng & Units 03/22/2020  WBC 4.0 - 10.5 K/uL 18.5(H)  Hemoglobin 13.0 - 17.0 g/dL 11.6(L)  Hematocrit 39 - 52 % 36.4(L)  Platelets 150 - 400 K/uL 785(H)    No images are attached to the encounter.  CT CHEST ABDOMEN PELVIS W CONTRAST  Result Date: 03/06/2020 CLINICAL DATA:  Esophageal cancer.  Staging. EXAM: CT CHEST, ABDOMEN, AND PELVIS WITH CONTRAST TECHNIQUE: Multidetector CT imaging of the chest, abdomen and pelvis was performed following the standard protocol during bolus administration of intravenous contrast. CONTRAST:  50mL OMNIPAQUE IOHEXOL 300 MG/ML  SOLN COMPARISON:  Esophagram 02/18/2020. FINDINGS: CT CHEST FINDINGS Cardiovascular: No significant vascular findings. Right supraclavicular adenopathy exerts mild mass  effect on the right internal jugular vein which remains patent. The heart size is normal. There is no pericardial effusion. Mediastinum/Nodes: As demonstrated on recent esophagram, there is a large irregular ulcerated mass involving the distal 3rd of the esophagus, extending approximately 6.3 cm in length on sagittal image 87/6. The proximal esophagus is moderately dilated and fluid-filled. There are  mildly prominent lymph nodes in the lower mediastinum, including an 11 mm subcarinal node on image 32/2 and 8 mm left hilar node on image 32/2. There are larger superior mediastinal lymph nodes, including a 2.5 cm right paratracheal node on image 11/2 and a 2.6 cm right supraclavicular node on image 6/2. The thyroid gland and trachea demonstrate no significant findings. Lungs/Pleura: No pleural effusion or pneumothorax. There are patchy ground-glass opacities and tree-in-bud nodularity in the left lower lobe with lesser involvement of the lingula, likely inflammatory. Largest nodular component in the superior segment of the lower lobe measures 5 mm on image 69/4. No right lung nodularity. Musculoskeletal/Chest wall: No chest wall mass or suspicious osseous findings. CT ABDOMEN AND PELVIS FINDINGS Hepatobiliary: The liver is normal in density without suspicious focal abnormality. No evidence of gallstones, gallbladder wall thickening or biliary dilatation. Pancreas: Unremarkable. No pancreatic ductal dilatation or surrounding inflammatory changes. Spleen: Normal in size without focal abnormality. Adrenals/Urinary Tract: Both adrenal glands appear normal. Both kidneys appear normal. No evidence of renal mass, urinary tract calculus or hydronephrosis. Mild bladder wall thickening likely relates to incomplete bladder distension. Stomach/Bowel: The stomach is decompressed with mild wall thickening, likely due to incomplete distension. No focal abnormality identified. The small bowel and appendix appear normal. There is  moderate stool throughout the colon with scattered diverticula. No bowel wall thickening, distention or surrounding inflammation. Vascular/Lymphatic: A 10 mm lymph node in the gastrohepatic ligament on image 52/2 is suspicious for a nodal metastasis. No other enlarged lymph nodes are seen within the abdomen or pelvis. Mild aortic and iliac atherosclerosis without acute vascular findings. The portal, superior mesenteric and splenic veins are patent. Reproductive: The prostate gland and seminal vesicles appear normal. Other: Small amount of free pelvic fluid. No focal fluid collection or peritoneal nodularity. Musculoskeletal: No acute or significant osseous findings. Mild lumbar spine facet arthropathy. IMPRESSION: 1. Large irregular ulcerated mass involving the distal 3rd of the esophagus consistent with known esophageal cancer. The proximal esophagus is moderately dilated and fluid-filled. 2. Enlarged superior mediastinal and right supraclavicular lymph nodes consistent with metastatic disease. Mildly prominent gastrohepatic ligament lymph node suspicious for nodal metastasis. No evidence of distant metastatic disease. 3. Patchy ground-glass opacities and tree-in-bud nodularity in the left lower lobe and lingula, likely inflammatory. Attention on follow-up recommended. 4. Aortic Atherosclerosis (ICD10-I70.0). Electronically Signed   By: Carey Bullocks M.D.   On: 03/06/2020 12:27   NM PET Image Initial (PI) Skull Base To Thigh  Result Date: 03/23/2020 CLINICAL DATA:  Initial treatment strategy for esophageal cancer. EXAM: NUCLEAR MEDICINE PET SKULL BASE TO THIGH TECHNIQUE: 5.0 mCi F-18 FDG was injected intravenously. Full-ring PET imaging was performed from the skull base to thigh after the radiotracer. CT data was obtained and used for attenuation correction and anatomic localization. Fasting blood glucose: 108 mg/dl COMPARISON:  37/01/6437 FINDINGS: Mediastinal blood pool activity: SUV max 2.5 Liver  activity: SUV max NA NECK: No hypermetabolic lymph nodes in the neck. Incidental CT findings: none CHEST: Mass involving the distal esophagus is again noted. This extends from the subcarinal region to the level of the GE junction measuring 4.0 x 2.9 by 10.2 cm. The SUV max is equal to 16.7. The proximal esophagus is dilated and fluid-filled. FDG avid thoracic lymph nodes are noted including: -FDG avid right supraclavicular lymph node measures 3.6 x 3.2 cm and has an SUV max of 11.2, image 26/4. -Necrotic high right paratracheal lymph node measures 3.1 cm within SUV max of 7.6  cm, image 29/4. -0.7 cm left paratracheal lymph node has an SUV max of 4.18, image 44/4. -Retroesophageal lymph node at the level of the aortic arch measures 0.6 cm within SUV max of 5.49, image 44/4. -Mild FDG uptake within the right hilar lymph node has an SUV max of 4.8, image 49/4. No FDG avid pulmonary nodule or mass. Incidental CT findings: none ABDOMEN/PELVIS: No abnormal FDG uptake within the liver, spleen, pancreas, or adrenal glands. Gastrohepatic ligament lymph node(s) are FDG avid. Index node measures 1.1 cm and has an SUV max of 7.97, image 79/4. Incidental CT findings: Left upper quadrant gastrostomy tube in place. Aortic atherosclerosis without aneurysm. SKELETON: No abnormal FDG uptake identified within the imaged portions of the axial and appendicular skeleton. Incidental CT findings: none IMPRESSION: 1. Intense FDG uptake is associated with the obstructing mass involving the mid and distal esophagus. 2. Signs of right supraclavicular, bilateral mediastinal, right hilar, and gastrohepatic ligament FDG avid nodal metastasis. 3. No signs of pulmonary metastasis or solid organ metastasis within the abdomen. 4.  Aortic Atherosclerosis (ICD10-I70.0). Electronically Signed   By: Kerby Moors M.D.   On: 03/23/2020 14:39   DG Chest Port 1 View  Result Date: 03/12/2020 CLINICAL DATA:  Aspiration pneumonia EXAM: PORTABLE CHEST 1  VIEW COMPARISON:  03/08/2020 chest radiograph and prior. FINDINGS: Right chest wall Port-A-Cath and right PICC tips overlies the cavoatrial junction/superior right atrium. Decreased conspicuity of subdiaphragmatic pneumoperitoneum, compatible with recent postprocedural sequela. Hazy perihilar and bibasilar opacities, grossly unchanged. No pneumothorax or pleural effusion. Cardiomediastinal silhouette within normal limits. IMPRESSION: Decreased subdiaphragmatic pneumoperitoneum, compatible with postprocedural sequela. Perihilar/bibasilar hazy opacities, unchanged. Electronically Signed   By: Primitivo Gauze M.D.   On: 03/12/2020 07:29   DG CHEST PORT 1 VIEW  Addendum Date: 03/08/2020   ADDENDUM REPORT: 03/08/2020 19:05 ADDENDUM: I was notified that the patient just had a percutaneous gastrostomy placement with insufflation of the abdomen. The free air noted in the upper abdomen is secondary to recent procedure. Electronically Signed   By: Anner Crete M.D.   On: 03/08/2020 19:05   Result Date: 03/08/2020 CLINICAL DATA:  64 year old male status post Port-A-Cath placement. EXAM: PORTABLE CHEST 1 VIEW COMPARISON:  CT of the chest abdomen pelvis dated 03/06/2020. FINDINGS: Right-sided Port-A-Cath with tip at the cavoatrial junction. Right-sided PICC with tip over upper SVC. There are bilateral lucencies along the diaphragms which may represent sub pulmonic pneumothoraces versus pneumoperitoneum. Further evaluation with chest CT is recommended. Right lung base density may represent atelectasis or infiltrate. Faint densities noted in the left mid to lower lung field. There is no pleural effusion. The cardiac silhouette is within limits. No acute osseous pathology. IMPRESSION: 1. Right-sided Port-A-Cath with tip over the upper SVC. 2. Bilateral pulmonary lucencies may represent sub pulmonic pneumothoraces versus pneumoperitoneum. Further evaluation with chest CT is recommended. 3. Right lung base density and  left mid to lower lung field faint densities. These results were called by telephone at the time of interpretation on 03/08/2020 at 6:39 pm to Nurse Rodolph Bong, who verbally acknowledged these results. Electronically Signed: By: Anner Crete M.D. On: 03/08/2020 18:49   DG C-Arm 1-60 Min-No Report  Result Date: 03/08/2020 Fluoroscopy was utilized by the requesting physician.  No radiographic interpretation.   [place] EKG SITE RITE  Result Date: 03/06/2020 If Site Rite image not attached, placement could not be confirmed due to current cardiac rhythm.    Assessment and plan- Patient is a 64 y.o. male squamous cell carcinoma  of the lower third of the esophagus with supraclavicular lymph node metastases.  He is here for on treatment assessment prior to cycle 1 of FOLFOX Opdivo chemotherapy  Counts are ok proceed with cycle 1 of palliative FOLFOX Opdivo chemotherapy today.  He will come back on day 3 for pump disconnect.  He is also seeing radiation oncology and should be starting palliative radiation to his lower esophagus soon.  When he starts radiation I will hold off on giving him immunotherapy at that time.  Discussed risks and benefits of chemotherapy including all but not limited to nausea, vomiting, low blood counts, risk of infections and hospitalization.  Risk of peripheral neuropathy associated with oxaliplatin.  Risk of autoimmune side effects associated with Opdivo including autoimmune colitis thyroiditis, pneumonitis and need to monitor kidney and liver functions.  Treatment will be given with a palliative intent.  Patient understands and agrees to proceed as planned  He has a PEG tube in place and has met with nutrition as well and has ongoing tube feeds.  Iron deficiency anemia: Patient noted to be mildly anemic with a hemoglobin of 11.6 with iron studies indicative of iron deficiency.  He will receive 1 dose of Feraheme on day 3 of pump disconnect and second dose in 2 weeks time.   Discussed risks and benefits of Feraheme including all but not limited to headache, leg swelling and possible risk of infusion reaction.  Patient understands and agrees to proceed as planned  Constipation: We will give him in writing that he needs to take both senna liquid as well as MiraLAX through his feeding tube to prevent ongoing constipation    Visit Diagnosis 1. Primary cancer of esophagus with metastasis to other site [facility])   2. Encounter for antineoplastic chemotherapy   3. Encounter for antineoplastic immunotherapy   4. Goals of care, counseling/discussion   5. Other constipation   6. Iron deficiency anemia, unspecified iron deficiency anemia type      Dr. Randa Evens, MD, MPH [facility] at [facility] [phone] 03/25/2020 12:11 PM

## 2020-03-29 NOTE — Progress Notes (Signed)
The following Medication: Andrew Pratt has been approved thru [employer] as Assistance Program. Enrollment period is 03/24/2020 to 03/23/2021. Assistance ID: [id number], medication is ordered prior to appointment to be on hand for treatment.  First DOS: 04/05/2020

## 2020-04-05 ENCOUNTER — Inpatient Hospital Stay: Payer: Medicaid Other | Attending: Oncology

## 2020-04-05 ENCOUNTER — Telehealth: Payer: Self-pay | Admitting: *Deleted

## 2020-04-05 ENCOUNTER — Other Ambulatory Visit: Payer: Self-pay

## 2020-04-05 ENCOUNTER — Inpatient Hospital Stay: Payer: Medicaid Other

## 2020-04-05 ENCOUNTER — Encounter: Payer: Self-pay | Admitting: Oncology

## 2020-04-05 ENCOUNTER — Inpatient Hospital Stay (HOSPITAL_BASED_OUTPATIENT_CLINIC_OR_DEPARTMENT_OTHER): Payer: Medicaid Other | Admitting: Oncology

## 2020-04-05 VITALS — BP 91/51 | HR 113 | Temp 97.9°F | Resp 16 | Ht 61.0 in | Wt 85.8 lb

## 2020-04-05 DIAGNOSIS — Z5111 Encounter for antineoplastic chemotherapy: Secondary | ICD-10-CM | POA: Insufficient documentation

## 2020-04-05 DIAGNOSIS — Z5112 Encounter for antineoplastic immunotherapy: Secondary | ICD-10-CM | POA: Insufficient documentation

## 2020-04-05 DIAGNOSIS — D509 Iron deficiency anemia, unspecified: Secondary | ICD-10-CM | POA: Diagnosis not present

## 2020-04-05 DIAGNOSIS — E876 Hypokalemia: Secondary | ICD-10-CM

## 2020-04-05 DIAGNOSIS — N179 Acute kidney failure, unspecified: Secondary | ICD-10-CM

## 2020-04-05 DIAGNOSIS — C770 Secondary and unspecified malignant neoplasm of lymph nodes of head, face and neck: Secondary | ICD-10-CM | POA: Insufficient documentation

## 2020-04-05 DIAGNOSIS — C155 Malignant neoplasm of lower third of esophagus: Secondary | ICD-10-CM

## 2020-04-05 DIAGNOSIS — R634 Abnormal weight loss: Secondary | ICD-10-CM

## 2020-04-05 DIAGNOSIS — C159 Malignant neoplasm of esophagus, unspecified: Secondary | ICD-10-CM

## 2020-04-05 DIAGNOSIS — R7989 Other specified abnormal findings of blood chemistry: Secondary | ICD-10-CM

## 2020-04-05 LAB — COMPREHENSIVE METABOLIC PANEL
ALT: 19 U/L (ref 0–44)
AST: 28 U/L (ref 15–41)
Albumin: 3.9 g/dL (ref 3.5–5.0)
Alkaline Phosphatase: 112 U/L (ref 38–126)
Anion gap: 13 (ref 5–15)
BUN: 77 mg/dL — ABNORMAL HIGH (ref 8–23)
CO2: 30 mmol/L (ref 22–32)
Calcium: 10 mg/dL (ref 8.9–10.3)
Chloride: 98 mmol/L (ref 98–111)
Creatinine, Ser: 1.59 mg/dL — ABNORMAL HIGH (ref 0.61–1.24)
GFR, Estimated: 48 mL/min — ABNORMAL LOW (ref >60)
Glucose, Bld: 182 mg/dL — ABNORMAL HIGH (ref 70–99)
Potassium: 3.4 mmol/L — ABNORMAL LOW (ref 3.5–5.1)
Sodium: 141 mmol/L (ref 135–145)
Total Bilirubin: 0.5 mg/dL (ref 0.3–1.2)
Total Protein: 9 g/dL — ABNORMAL HIGH (ref 6.5–8.1)

## 2020-04-05 LAB — CBC WITH DIFFERENTIAL/PLATELET
Abs Immature Granulocytes: 0.05 10*3/uL (ref 0.00–0.07)
Basophils Absolute: 0 10*3/uL (ref 0.0–0.1)
Basophils Relative: 0 %
Eosinophils Absolute: 0.1 10*3/uL (ref 0.0–0.5)
Eosinophils Relative: 2 %
HCT: 41.9 % (ref 39.0–52.0)
Hemoglobin: 13.2 g/dL (ref 13.0–17.0)
Immature Granulocytes: 1 %
Lymphocytes Relative: 18 %
Lymphs Abs: 1.6 10*3/uL (ref 0.7–4.0)
MCH: 25.3 pg — ABNORMAL LOW (ref 26.0–34.0)
MCHC: 31.5 g/dL (ref 30.0–36.0)
MCV: 80.4 fL (ref 80.0–100.0)
Monocytes Absolute: 0.8 10*3/uL (ref 0.1–1.0)
Monocytes Relative: 9 %
Neutro Abs: 6.6 10*3/uL (ref 1.7–7.7)
Neutrophils Relative %: 70 %
Platelets: 328 10*3/uL (ref 150–400)
RBC: 5.21 MIL/uL (ref 4.22–5.81)
RDW: 17.6 % — ABNORMAL HIGH (ref 11.5–15.5)
WBC: 9.3 10*3/uL (ref 4.0–10.5)
nRBC: 0 % (ref 0.0–0.2)

## 2020-04-05 LAB — TSH: TSH: 2.663 u[IU]/mL (ref 0.350–4.500)

## 2020-04-05 MED ORDER — DEXTROSE 5 % IV SOLN
Freq: Once | INTRAVENOUS | Status: AC
  Filled 2020-04-05: qty 250

## 2020-04-05 MED ORDER — SODIUM CHLORIDE 0.9% FLUSH
10.0000 mL | Freq: Once | INTRAVENOUS | Status: AC
  Administered 2020-04-05: 10 mL via INTRAVENOUS
  Filled 2020-04-05: qty 10

## 2020-04-05 MED ORDER — SODIUM CHLORIDE 0.9 % IV SOLN
10.0000 mg | Freq: Once | INTRAVENOUS | Status: AC
  Administered 2020-04-05: 10 mg via INTRAVENOUS
  Filled 2020-04-05: qty 10

## 2020-04-05 MED ORDER — SODIUM CHLORIDE 0.9 % IV SOLN
2400.0000 mg/m2 | INTRAVENOUS | Status: DC
  Administered 2020-04-05: 3300 mg via INTRAVENOUS
  Filled 2020-04-05: qty 66

## 2020-04-05 MED ORDER — FLUOROURACIL CHEMO INJECTION 2.5 GM/50ML
400.0000 mg/m2 | Freq: Once | INTRAVENOUS | Status: AC
  Administered 2020-04-05: 550 mg via INTRAVENOUS
  Filled 2020-04-05: qty 11

## 2020-04-05 MED ORDER — POTASSIUM CHLORIDE IN NACL 20-0.9 MEQ/L-% IV SOLN
Freq: Once | INTRAVENOUS | Status: AC
  Filled 2020-04-05: qty 1000

## 2020-04-05 MED ORDER — SODIUM CHLORIDE 0.9% FLUSH
10.0000 mL | INTRAVENOUS | Status: DC | PRN
  Administered 2020-04-05 (×2): 10 mL
  Filled 2020-04-05: qty 10

## 2020-04-05 MED ORDER — LEUCOVORIN CALCIUM INJECTION 350 MG: 400.0000 mg/m2 | Freq: Once | INTRAVENOUS | Status: DC

## 2020-04-05 MED ORDER — PALONOSETRON HCL INJECTION 0.25 MG/5ML
0.2500 mg | Freq: Once | INTRAVENOUS | Status: AC
  Administered 2020-04-05: 0.25 mg via INTRAVENOUS
  Filled 2020-04-05: qty 5

## 2020-04-05 MED ORDER — OXALIPLATIN CHEMO INJECTION 100 MG/20ML
85.0000 mg/m2 | Freq: Once | INTRAVENOUS | Status: AC
  Administered 2020-04-05: 115 mg via INTRAVENOUS
  Filled 2020-04-05: qty 20

## 2020-04-05 MED ORDER — LEUCOVORIN CALCIUM INJECTION 100 MG
20.0000 mg/m2 | Freq: Once | INTRAMUSCULAR | Status: AC
  Administered 2020-04-05: 26 mg via INTRAVENOUS
  Filled 2020-04-05: qty 1.3

## 2020-04-05 NOTE — Progress Notes (Signed)
Pt still can't swallow due to his cancer. He ahs pain in stomach and his back rating at 10. He has no money to pay for anything. He is constipated today . He has no meds to take. He does give himself tube feeding 4 times a day. He is living with nephew now

## 2020-04-05 NOTE — Progress Notes (Signed)
HR 113, Cr 1.59.  Ok to proceed with tx today. Patient to get IVF

## 2020-04-05 NOTE — Telephone Encounter (Signed)
Called nephew [name] today . I confirmed that the pt lives with [name] and [place]. I told him that pt has lost more weight due to the cancer and the fact that he can't swallow anything. When he comes in Thursday to get pump taken off he will see radiation doctor and they will get him set up for radiation . Dr. Janese Banks told pt today that the radiation would likely shrink the tumor so that he can swallow again and hopefully gain wt. I also checked with pt today and he has been feeding himself 4 cans a day of osmolite and he needs to take 5 cans a day. And flush with water affter every feeding. Pt states that he does flush with water and wanted to see if the family can try to help him make sure that he gets 5 cans a day in. [name] said ok and I asked him to also let Ellison Hughs know and he is ok with that

## 2020-04-06 LAB — CEA: CEA: 3.9 ng/mL (ref 0.0–4.7)

## 2020-04-06 LAB — T4: T4, Total: 14.5 ug/dL — ABNORMAL HIGH (ref 4.5–12.0)

## 2020-04-07 ENCOUNTER — Inpatient Hospital Stay: Payer: Medicaid Other

## 2020-04-07 ENCOUNTER — Other Ambulatory Visit: Payer: Self-pay

## 2020-04-07 ENCOUNTER — Encounter: Payer: Self-pay | Admitting: Oncology

## 2020-04-07 ENCOUNTER — Encounter: Payer: Self-pay | Admitting: Radiation Oncology

## 2020-04-07 ENCOUNTER — Ambulatory Visit
Admission: RE | Admit: 2020-04-07 | Discharge: 2020-04-07 | Disposition: A | Discharge status: Home / Self Care | Payer: Medicaid Other | Source: Ambulatory Visit | Attending: Radiation Oncology | Admitting: Radiation Oncology

## 2020-04-07 VITALS — BP 95/71 | HR 92 | Temp 98.2°F | Resp 16

## 2020-04-07 VITALS — BP 90/66 | HR 100 | Temp 96.4°F | Resp 16 | Wt 87.9 lb

## 2020-04-07 DIAGNOSIS — D508 Other iron deficiency anemias: Secondary | ICD-10-CM

## 2020-04-07 DIAGNOSIS — C155 Malignant neoplasm of lower third of esophagus: Secondary | ICD-10-CM

## 2020-04-07 DIAGNOSIS — Z5112 Encounter for antineoplastic immunotherapy: Secondary | ICD-10-CM | POA: Diagnosis not present

## 2020-04-07 DIAGNOSIS — C159 Malignant neoplasm of esophagus, unspecified: Secondary | ICD-10-CM

## 2020-04-07 MED ORDER — HEPARIN SOD (PORK) LOCK FLUSH 100 UNIT/ML IV SOLN
INTRAVENOUS | Status: AC
  Filled 2020-04-07: qty 5

## 2020-04-07 MED ORDER — HEPARIN SOD (PORK) LOCK FLUSH 100 UNIT/ML IV SOLN
500.0000 [IU] | Freq: Once | INTRAVENOUS | Status: AC | PRN
  Administered 2020-04-07: 500 [IU]
  Filled 2020-04-07: qty 5

## 2020-04-07 MED ORDER — SODIUM CHLORIDE 0.9% FLUSH
10.0000 mL | INTRAVENOUS | Status: DC | PRN
  Filled 2020-04-07: qty 10

## 2020-04-07 MED ORDER — SODIUM CHLORIDE 0.9 % IV SOLN
Freq: Once | INTRAVENOUS | Status: AC
  Filled 2020-04-07: qty 250

## 2020-04-07 MED ORDER — SODIUM CHLORIDE 0.9 % IV SOLN
510.0000 mg | Freq: Once | INTRAVENOUS | Status: AC
  Administered 2020-04-07: 510 mg via INTRAVENOUS
  Filled 2020-04-07: qty 17

## 2020-04-07 NOTE — Progress Notes (Signed)
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 Marius Ditch as an outpatient for symptoms of dysphagia. He underwent EGD on 03/02/2020 which showed a large fungating and ulcerating mass with bleeding and stigmata of recent bleeding in the distal esophagus 30 cm from the incisors. The degree of obstruction was severe enough that the scope could not be passed beyond the distal esophagus. Patient was supposed to see me today as an outpatient but then got admitted to the hospital with severe dysphagia to the point that he was not able to swallow anything and had a PEG tube placement.  CT chest abdomen pelvis with contrast  shows a large irregular ulcerated mass in the distal third of the esophagus with proximal esophagus being dilated and fluid-filled. Bilateral paratracheal and mediastinal adenopathy noted. Also found to have a 2.6 cm right supraclavicular lymph node no evidence of liver or other organ metastases.Supraclavicular lymph node was biopsied and was consistent with squamous cell carcinoma  Plan is for palliative FOLFOX Opdivo treatment along with palliative radiation to the lower esophageal mass. CPS 15.  Interval history-he is constantly spitting out his saliva given the complete obstruction from his esophageal mass.  He is living with his nephews and nieces who have been helping him with his tube feeds but he is unsure how  many cartons he takes per day.  Feels constipated but is unsure if he is taking any medications for the same or not  ECOG PS- 2 Pain scale- 3 Opioid associated constipation- no  Review of systems- Review of Systems  Constitutional: Positive for malaise/fatigue. Negative for chills, fever and weight loss.  HENT: Negative for congestion, ear discharge and nosebleeds.   Eyes: Negative for blurred vision.  Respiratory: Negative for cough, hemoptysis, sputum production, shortness of breath and wheezing.   Cardiovascular: Negative for chest pain, palpitations, orthopnea and claudication.  Gastrointestinal: Positive for constipation. Negative for abdominal pain, blood in stool, diarrhea, heartburn, melena, nausea and vomiting.       Cannot swallow  Genitourinary: Negative for dysuria, flank pain, frequency, hematuria and urgency.  Musculoskeletal: Negative for back pain, joint pain and myalgias.  Skin: Negative for rash.  Neurological: Negative for dizziness, tingling, focal weakness, seizures, weakness and headaches.  Endo/Heme/Allergies: Does not bruise/bleed easily.  Psychiatric/Behavioral: Negative for depression and suicidal ideas. The patient does not have insomnia.       No Known Allergies   Past Medical History:  Diagnosis Date  . esophageal cancer      Past Surgical History:  Procedure Laterality Date  . ESOPHAGOGASTRODUODENOSCOPY (EGD) WITH PROPOFOL N/A 03/02/2020   Procedure: ESOPHAGOGASTRODUODENOSCOPY (EGD) WITH PROPOFOL;  Surgeon: Lin Landsman, MD;  Location: [place];  Service: Gastroenterology;  Laterality: N/A;  . MASS BIOPSY N/A 03/08/2020   Procedure: NECK MASS BIOPSY;  Surgeon: Jules Husbands, MD;  Location: [facility];  Service: General;  Laterality: N/A;  . PORTACATH PLACEMENT N/A 03/08/2020   Procedure: INSERTION PORT-A-CATH;  Surgeon: Jules Husbands,  MD;  Location: [facility];  Service: General;  Laterality: N/A;    Social History   Socioeconomic  History  . Marital status: Single    Spouse name: Not on file  . Number of children: Not on file  . Years of education: Not on file  . Highest education level: Not on file  Occupational History  . Not on file  Tobacco Use  . Smoking status: Former Smoker    Packs/day: 0.50    Types: Cigarettes  . Smokeless tobacco: Never Used  . Tobacco comment: QUIT 3 WKS AGO  Vaping Use  . Vaping Use: Never used  Substance and Sexual Activity  . Alcohol use: Not Currently  . Drug use: Not Currently  . Sexual activity: Not Currently  Other Topics Concern  . Not on file  Social History Narrative  . Not on file   Social Determinants of Health   Financial Resource Strain:   . Difficulty of Paying Living Expenses: Not on file  Food Insecurity:   . Worried About Charity fundraiser in the Last Year: Not on file  . Ran Out of Food in the Last Year: Not on file  Transportation Needs:   . Lack of Transportation (Medical): Not on file  . Lack of Transportation (Non-Medical): Not on file  Physical Activity:   . Days of Exercise per Week: Not on file  . Minutes of Exercise per Session: Not on file  Stress:   . Feeling of Stress : Not on file  Social Connections:   . Frequency of Communication with Friends and Family: Not on file  . Frequency of Social Gatherings with Friends and Family: Not on file  . Attends Religious Services: Not on file  . Active Member of Clubs or Organizations: Not on file  . Attends Archivist Meetings: Not on file  . Marital Status: Not on file  Intimate Partner Violence:   . Fear of Current or Ex-Partner: Not on file  . Emotionally Abused: Not on file  . Physically Abused: Not on file  . Sexually Abused: Not on file    Family History  Problem Relation Age of Onset  . Cancer Mother        lung     Current Outpatient Medications:  .  Nutritional Supplements (FEEDING SUPPLEMENT, OSMOLITE 1.5 CAL,) LIQD, Place 237 mLs into feeding tube 5 (five) times  daily., Disp: , Rfl: 0 .  Water For Irrigation, Sterile (FREE WATER) SOLN, Place 100 mLs into feeding tube 5 (five) times daily., Disp: 15000 mL, Rfl: 0 .  acetaminophen (TYLENOL) 325 MG tablet, Place 2 tablets (650 mg total) into feeding tube every 6 (six) hours as needed for mild pain (or Fever >/= 101). (Patient not taking: Reported on 03/21/2020), Disp: , Rfl:  .  fentaNYL (DURAGESIC) 25 MCG/HR, Place 1 patch onto the skin every 3 (three) days. To help with pain (Patient not taking: Reported on 04/05/2020), Disp: 5 patch, Rfl: 0 .  lidocaine-prilocaine (EMLA) cream, Apply to affected area once (Patient not taking: Reported on 04/05/2020), Disp: 30 g, Rfl: 3 .  metoCLOPramide (REGLAN) 5 MG/5ML solution, Take 5 mLs (5 mg total) by mouth 4 (four) times daily -  before meals and at bedtime. (Patient not taking: Reported on 03/21/2020), Disp: 600 mL, Rfl: 0 .  Morphine Sulfate (MORPHINE CONCENTRATE) 10 MG/0.5ML SOLN concentrated solution, Take 0.25 mLs (5 mg total) by mouth every 4 (four) hours as needed for severe pain. (Patient not  taking: Reported on 03/21/2020), Disp: 30 mL, Rfl: 0 .  nicotine (NICODERM CQ - DOSED IN MG/24 HOURS) 21 mg/24hr patch, One 21mg  patch chest wall daily (okay to substitute generic) (Patient not taking: Reported on 03/21/2020), Disp: 28 patch, Rfl: 0 .  ondansetron (ZOFRAN ODT) 4 MG disintegrating tablet, Take 1 tablet (4 mg total) by mouth every 8 (eight) hours as needed for nausea or vomiting. (Patient not taking: Reported on 03/21/2020), Disp: 20 tablet, Rfl: 0  Physical exam:  Vitals:   04/05/20 1010  BP: (!) 91/51  Pulse: (!) 113  Resp: 16  Temp: 97.9 F (36.6 C)  TempSrc: Tympanic  Weight: 85 lb 12.8 oz (38.9 kg)  Height: 5\' 1"  (1.549 m)   Physical Exam Constitutional:      Comments: Appears fatigued  Cardiovascular:     Rate and Rhythm: Regular rhythm. Tachycardia present.     Heart sounds: Normal heart sounds.  Pulmonary:     Effort: Pulmonary effort is  normal.     Breath sounds: Normal breath sounds.  Abdominal:     General: Bowel sounds are normal.     Palpations: Abdomen is soft.  Skin:    General: Skin is warm and dry.  Neurological:     Mental Status: He is alert and oriented to person, place, and time.      CMP Latest Ref Rng & Units 04/05/2020  Glucose 70 - 99 mg/dL 182(H)  BUN 8 - 23 mg/dL 77(H)  Creatinine 0.61 - 1.24 mg/dL 1.59(H)  Sodium 135 - 145 mmol/L 141  Potassium 3.5 - 5.1 mmol/L 3.4(L)  Chloride 98 - 111 mmol/L 98  CO2 22 - 32 mmol/L 30  Calcium 8.9 - 10.3 mg/dL 10.0  Total Protein 6.5 - 8.1 g/dL 9.0(H)  Total Bilirubin 0.3 - 1.2 mg/dL 0.5  Alkaline Phos 38 - 126 U/L 112  AST 15 - 41 U/L 28  ALT 0 - 44 U/L 19   CBC Latest Ref Rng & Units 04/05/2020  WBC 4.0 - 10.5 K/uL 9.3  Hemoglobin 13.0 - 17.0 g/dL 13.2  Hematocrit 39 - 52 % 41.9  Platelets 150 - 400 K/uL 328    No images are attached to the encounter.  NM PET Image Initial (PI) Skull Base To Thigh  Result Date: 03/23/2020 CLINICAL DATA:  Initial treatment strategy for esophageal cancer. EXAM: NUCLEAR MEDICINE PET SKULL BASE TO THIGH TECHNIQUE: 5.0 mCi F-18 FDG was injected intravenously. Full-ring PET imaging was performed from the skull base to thigh after the radiotracer. CT data was obtained and used for attenuation correction and anatomic localization. Fasting blood glucose: 108 mg/dl COMPARISON:  03/06/2020 FINDINGS: Mediastinal blood pool activity: SUV max 2.5 Liver activity: SUV max NA NECK: No hypermetabolic lymph nodes in the neck. Incidental CT findings: none CHEST: Mass involving the distal esophagus is again noted. This extends from the subcarinal region to the level of the GE junction measuring 4.0 x 2.9 by 10.2 cm. The SUV max is equal to 16.7. The proximal esophagus is dilated and fluid-filled. FDG avid thoracic lymph nodes are noted including: -FDG avid right supraclavicular lymph node measures 3.6 x 3.2 cm and has an SUV max of 11.2, image  26/4. -Necrotic high right paratracheal lymph node measures 3.1 cm within SUV max of 7.6 cm, image 29/4. -0.7 cm left paratracheal lymph node has an SUV max of 4.18, image 44/4. -Retroesophageal lymph node at the level of the aortic arch measures 0.6 cm within SUV max of 5.49,  image 44/4. -Mild FDG uptake within the right hilar lymph node has an SUV max of 4.8, image 49/4. No FDG avid pulmonary nodule or mass. Incidental CT findings: none ABDOMEN/PELVIS: No abnormal FDG uptake within the liver, spleen, pancreas, or adrenal glands. Gastrohepatic ligament lymph node(s) are FDG avid. Index node measures 1.1 cm and has an SUV max of 7.97, image 79/4. Incidental CT findings: Left upper quadrant gastrostomy tube in place. Aortic atherosclerosis without aneurysm. SKELETON: No abnormal FDG uptake identified within the imaged portions of the axial and appendicular skeleton. Incidental CT findings: none IMPRESSION: 1. Intense FDG uptake is associated with the obstructing mass involving the mid and distal esophagus. 2. Signs of right supraclavicular, bilateral mediastinal, right hilar, and gastrohepatic ligament FDG avid nodal metastasis. 3. No signs of pulmonary metastasis or solid organ metastasis within the abdomen. 4.  Aortic Atherosclerosis (ICD10-I70.0). Electronically Signed   By: Kerby Moors M.D.   On: 03/23/2020 14:39   DG Chest Port 1 View  Result Date: 03/12/2020 CLINICAL DATA:  Aspiration pneumonia EXAM: PORTABLE CHEST 1 VIEW COMPARISON:  03/08/2020 chest radiograph and prior. FINDINGS: Right chest wall Port-A-Cath and right PICC tips overlies the cavoatrial junction/superior right atrium. Decreased conspicuity of subdiaphragmatic pneumoperitoneum, compatible with recent postprocedural sequela. Hazy perihilar and bibasilar opacities, grossly unchanged. No pneumothorax or pleural effusion. Cardiomediastinal silhouette within normal limits. IMPRESSION: Decreased subdiaphragmatic pneumoperitoneum, compatible  with postprocedural sequela. Perihilar/bibasilar hazy opacities, unchanged. Electronically Signed   By: Primitivo Gauze M.D.   On: 03/12/2020 07:29   DG CHEST PORT 1 VIEW  Addendum Date: 03/08/2020   ADDENDUM REPORT: 03/08/2020 19:05 ADDENDUM: I was notified that the patient just had a percutaneous gastrostomy placement with insufflation of the abdomen. The free air noted in the upper abdomen is secondary to recent procedure. Electronically Signed   By: Anner Crete M.D.   On: 03/08/2020 19:05   Result Date: 03/08/2020 CLINICAL DATA:  64 year old male status post Port-A-Cath placement. EXAM: PORTABLE CHEST 1 VIEW COMPARISON:  CT of the chest abdomen pelvis dated 03/06/2020. FINDINGS: Right-sided Port-A-Cath with tip at the cavoatrial junction. Right-sided PICC with tip over upper SVC. There are bilateral lucencies along the diaphragms which may represent sub pulmonic pneumothoraces versus pneumoperitoneum. Further evaluation with chest CT is recommended. Right lung base density may represent atelectasis or infiltrate. Faint densities noted in the left mid to lower lung field. There is no pleural effusion. The cardiac silhouette is within limits. No acute osseous pathology. IMPRESSION: 1. Right-sided Port-A-Cath with tip over the upper SVC. 2. Bilateral pulmonary lucencies may represent sub pulmonic pneumothoraces versus pneumoperitoneum. Further evaluation with chest CT is recommended. 3. Right lung base density and left mid to lower lung field faint densities. These results were called by telephone at the time of interpretation on 03/08/2020 at 6:39 pm to Nurse Rodolph Bong, who verbally acknowledged these results. Electronically Signed: By: Anner Crete M.D. On: 03/08/2020 18:49   DG C-Arm 1-60 Min-No Report  Result Date: 03/08/2020 Fluoroscopy was utilized by the requesting physician.  No radiographic interpretation.     Assessment and plan- Patient is a 64 y.o. male squamous cell  carcinoma of the lower third of the esophagus with supraclavicular lymph node metastases. he is here for on treatment assessment prior to cycle 2 of palliative folfox chemotherapy  Counts ok to proceed with cycle 2 of folfox chemotherapy today. opdivo to be given q4 weeks.   He has not started palliative RT yet  Patient cannot swallow anything and  has to constantly spit up his saliva. He has a peg tube in place but it is unclear if he is taking 5 cartons a day. We will reach out to his niece to get a better understanding   AKI- 1L IVF today with 20 K  Neoplasm related pain- again patient not able to tell me clearly if he is using his morphine or not.   Constipation- I had asked him to take miraax and senna through his peg. But patient is a poor historian and unable to give a detailed history  I will have him see palliative care as well.  I will see him in 3 weeks for next treatment Visit Diagnosis 1. Encounter for antineoplastic chemotherapy   2. Abnormal weight loss   3. Malignant neoplasm of lower third of esophagus (HCC)   4. AKI (acute kidney injury) ([place])   5. Hypokalemia      Dr. Randa Evens, MD, MPH [facility] [phone] 04/07/2020 12:28 PM

## 2020-04-07 NOTE — Progress Notes (Addendum)
Nutrition Assessment   Reason for Assessment:  New tube feeder, weight loss, esophageal cancer   ASSESSMENT:  64 year old male with stage IV esophageal cancer.  Patient receiving chemotherapy and immunotherapy.  Has not started radiation therapy yet.   Spoke with patient in infusion.  He does not know how many cartons of tube feeding he is doing.  Asked that I speak with his niece.  He reports that he had 2 bowel movements yesterday and having gas.    RD spoke with Deberah Pelton, niece in consult room.  Niece reports that he is able to give tube feeding himself without difficult.  She says that he has been giving 3 cartons of tube feeding.  Has had issues with constipation.  Reports that she is giving him miralax and senna as needed.  Patient unable to take anything by mouth.  Spits up saliva and all liquids in emesis bag. Niece says that he is not taking any other medication at this time. Medicaid was approved today per niece.     Medications: senna, miralax   Labs: glucose 182, BUN 77, creatinine 1.59   Anthropometrics:   Height: 61 inches Weight: 87 lb 14.4 oz 11/11 UBW: 123 lb in 02/09/2020 BMI: 16  29% weight loss in the last 2 months, significant   Estimated Energy Needs  Kcals: 1500-1700 calories/d Protein:  75-85 g Fluid: > 1.5 L   NUTRITION DIAGNOSIS: Inadequate oral intake related to esophageal cancer/dysphagia as evidenced by relying on enteral nutrition and 29% weight loss in 2 months    INTERVENTION:  Reviewed with niece that patient should be giving 5 cartons of osmolite 1.5 (ensure plus substituted as out of supply) per day (7am, 10am, 1pm, 4pm, 7pm).  Flush with 69ml before and after each feeding.  Additional water flush of 219ml BID can be used with medications if needed.  Provides 1775 calories, 74.5 g protein and 181ml free water (including from formula).  Hand written schedule given to niece.  She says that she works during the day and will sit out how many  cartons he needs each day.  She will help him with first and last feeding of the day.  Provided handout on oral care.  Continue to provide treatment for constipation.    MONITORING, EVALUATION, GOAL: weight trends, intake, tube feeding tolerance   Next Visit: Nov 22 phone call with Assunta Gambles B. Zenia Resides, [place], [place] Registered Dietitian [phone] (mobile)

## 2020-04-07 NOTE — Progress Notes (Signed)
Radiation Oncology Follow up Note  Name: Andrew Pratt   Date:   04/07/2020 MRN:  [id number] DOB: [birth year]    This 64 y.o. male presents to the clinic today for evaluation of palliative radiation therapy for patient with stage IV squamous cell carcinoma the esophagus.  REFERRING PROVIDER: No ref. provider found  HPI: Patient is a 64 year old male originally consulted back in October for stage IV squamous cell carcinoma of the esophagus.  He has significant metastatic disease including enlargement of superior mediastinal right supraclavicular lymph nodes consistent with metastatic disease on PET/CT.Marland Kitchen  Patient has had 2 cycles of FOLFOX chemotherapy.  He had a port placed as well as a feeding tube.  He has been hospitalized since with significant dysphagia is now referred back to radiation for consideration of short course of palliative radiation therapy.  He is now having difficulty swallowing his own saliva.  COMPLICATIONS OF TREATMENT: none  FOLLOW UP COMPLIANCE: keeps appointments   PHYSICAL EXAM:  BP 90/66 (BP Location: Left Arm, Patient Position: Sitting)   Pulse 100   Temp (!) 96.4 F (35.8 C) (Tympanic)   Resp 16   Wt 87 lb 14.4 oz (39.9 kg)   BMI 16.61 kg/m  Frail-appearing male in moderate distress.  He has a PEG tube placed which is functioning.  Also has a port placed.  Well-developed well-nourished patient in NAD. HEENT reveals PERLA, EOMI, discs not visualized.  Oral cavity is clear. No oral mucosal lesions are identified. Neck is clear without evidence of cervical or supraclavicular adenopathy. Lungs are clear to A&P. Cardiac examination is essentially unremarkable with regular rate and rhythm without murmur rub or thrill. Abdomen is benign with no organomegaly or masses noted. Motor sensory and DTR levels are equal and symmetric in the upper and lower extremities. Cranial nerves II through XII are grossly intact. Proprioception is intact. No peripheral adenopathy or edema  is identified. No motor or sensory levels are noted. Crude visual fields are within normal range.  RADIOLOGY RESULTS: PET/CT and CT scans reviewed  PLAN: At this time to go ahead with [place] a 4-week course of palliative radiation therapy to his esophagus with plan delivering 40 Gray in 20 fractions.  Risks and benefits of treatment including possible skin reaction fatigue alteration of blood counts all were described in detail to the patient.  He comprehends her treatment plan well.  I have personally set him up for simulation next week and will start treatments the same week.  I would like to take this opportunity to thank you for allowing me to participate in the care of your patient.Noreene Filbert, MD

## 2020-04-12 ENCOUNTER — Ambulatory Visit: Payer: Medicaid Other

## 2020-04-12 DIAGNOSIS — C155 Malignant neoplasm of lower third of esophagus: Secondary | ICD-10-CM | POA: Diagnosis present

## 2020-04-12 DIAGNOSIS — Z510 Encounter for antineoplastic radiation therapy: Secondary | ICD-10-CM | POA: Insufficient documentation

## 2020-04-13 DIAGNOSIS — Z510 Encounter for antineoplastic radiation therapy: Secondary | ICD-10-CM | POA: Diagnosis not present

## 2020-04-14 ENCOUNTER — Inpatient Hospital Stay: Payer: Medicaid Other | Admitting: Hospice and Palliative Medicine

## 2020-04-14 ENCOUNTER — Ambulatory Visit
Admission: RE | Admit: 2020-04-14 | Discharge: 2020-04-14 | Disposition: A | Discharge status: Home / Self Care | Payer: Medicaid Other | Source: Ambulatory Visit | Attending: Radiation Oncology | Admitting: Radiation Oncology

## 2020-04-14 ENCOUNTER — Other Ambulatory Visit: Payer: Self-pay

## 2020-04-14 DIAGNOSIS — Z510 Encounter for antineoplastic radiation therapy: Secondary | ICD-10-CM | POA: Diagnosis not present

## 2020-04-15 ENCOUNTER — Ambulatory Visit
Admission: RE | Admit: 2020-04-15 | Discharge: 2020-04-15 | Disposition: A | Discharge status: Home / Self Care | Payer: Medicaid Other | Source: Ambulatory Visit | Attending: Radiation Oncology | Admitting: Radiation Oncology

## 2020-04-15 DIAGNOSIS — Z510 Encounter for antineoplastic radiation therapy: Secondary | ICD-10-CM | POA: Diagnosis not present

## 2020-04-18 ENCOUNTER — Inpatient Hospital Stay: Payer: Medicaid Other

## 2020-04-18 ENCOUNTER — Ambulatory Visit
Admission: RE | Admit: 2020-04-18 | Discharge: 2020-04-18 | Disposition: A | Discharge status: Home / Self Care | Payer: Medicaid Other | Source: Ambulatory Visit | Attending: Radiation Oncology | Admitting: Radiation Oncology

## 2020-04-18 DIAGNOSIS — Z510 Encounter for antineoplastic radiation therapy: Secondary | ICD-10-CM | POA: Diagnosis not present

## 2020-04-18 NOTE — Progress Notes (Signed)
Nutrition Follow-up:  Patient with stage IV esophageal cancer.  Patient receiving chemotherapy and immunotherapy and has started radiation.   Called niece Ellison Hughs but was unavailable.  Left message with call back number.   Called patient and spoke with him via phone.  Patient reports that he is giving 5 cartons of osmolite 1.5 about 3 hours apart.  Says that he is flushing with a syringe of water before and after feeding.  Denies stomach upset or intolerance to tube feeding. Reports bowel movement on Friday.    Medications: reviewed  Labs: reviewed  Anthropometrics:   No new weight   Estimated Energy Needs  Kcals: 1500-1700 Protein: 75-85 Fluid: > 1.5 L  NUTRITION DIAGNOSIS: Inadequate oral intake continues     INTERVENTION:  Continue 5 cartons spaced 3 hours apart of osmolite 1.5 (ensure plus substituted by DME).  Flush with 45ml water before and after each feeding.  Flush with 256ml 1-2 times per day.  Can be given with medications.   Contact information provided    MONITORING, EVALUATION, GOAL: weight trends, tube feeding tolerance   NEXT VISIT: Dec 16 f/u at radiation treatment  Amiayah Giebel B. Zenia Resides, [place], [place] Registered Dietitian [phone] (mobile)

## 2020-04-19 ENCOUNTER — Telehealth: Payer: Self-pay

## 2020-04-19 ENCOUNTER — Ambulatory Visit
Admission: RE | Admit: 2020-04-19 | Discharge: 2020-04-19 | Disposition: A | Discharge status: Home / Self Care | Payer: Medicaid Other | Source: Ambulatory Visit | Attending: Radiation Oncology | Admitting: Radiation Oncology

## 2020-04-19 ENCOUNTER — Inpatient Hospital Stay (HOSPITAL_BASED_OUTPATIENT_CLINIC_OR_DEPARTMENT_OTHER): Payer: Medicaid Other | Admitting: Hospice and Palliative Medicine

## 2020-04-19 DIAGNOSIS — C159 Malignant neoplasm of esophagus, unspecified: Secondary | ICD-10-CM | POA: Diagnosis not present

## 2020-04-19 DIAGNOSIS — Z515 Encounter for palliative care: Secondary | ICD-10-CM | POA: Diagnosis not present

## 2020-04-19 DIAGNOSIS — Z7189 Other specified counseling: Secondary | ICD-10-CM

## 2020-04-19 DIAGNOSIS — G893 Neoplasm related pain (acute) (chronic): Secondary | ICD-10-CM | POA: Diagnosis not present

## 2020-04-19 DIAGNOSIS — Z510 Encounter for antineoplastic radiation therapy: Secondary | ICD-10-CM | POA: Diagnosis not present

## 2020-04-19 DIAGNOSIS — Z5112 Encounter for antineoplastic immunotherapy: Secondary | ICD-10-CM | POA: Diagnosis not present

## 2020-04-19 MED ORDER — SCOPOLAMINE 1 MG/3DAYS TD PT72: 1.0000 | MEDICATED_PATCH | TRANSDERMAL | 12 refills | Status: DC

## 2020-04-19 NOTE — Telephone Encounter (Signed)
Attempted to schedule a Palliative care consult. Spoke with patient. Phone call was disconnected. Call backed and a lady answered and said the patient was eating to and recommended that I call [name] (pt's nephew). Attempted to contact [name] and number was busy. Will try again later.

## 2020-04-19 NOTE — Progress Notes (Signed)
[place]  [phone] [phone]   Name: DRAVEN NATTER Date: 04/19/2020 MRN: [id number]  DOB: [birth year]  Patient Care Team: Patient, No Pcp Per as PCP - General (General Practice) Clent Jacks, RN as Oncology Nurse Navigator    REASON FOR CONSULTATION: Andrew Pratt is a 64 y.o. 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  Patient is status post PEG tube placement.  He has had significant symptom burden with dysphagia and difficulty managing oral secretions.  He is also have pain and constipation.  Palliative care was consulted help address goals and manage ongoing symptoms.  SOCIAL HISTORY:     reports that he has quit smoking. His smoking use included cigarettes. He smoked 0.50 packs per day. He has never used smokeless tobacco. He reports previous alcohol use. He reports previous drug use.   Patient was never married has no children.  He lives with a nephew and nephew's wife.  He has 12 brothers and sisters.  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  ADVANCE DIRECTIVES:  Does not have  CODE STATUS:   PAST MEDICAL HISTORY: Past Medical History:  Diagnosis Date   esophageal cancer     PAST SURGICAL HISTORY:  Past Surgical History:  Procedure Laterality Date   ESOPHAGOGASTRODUODENOSCOPY (EGD) WITH PROPOFOL N/A 03/02/2020   Procedure: ESOPHAGOGASTRODUODENOSCOPY (EGD) WITH PROPOFOL;  Surgeon: Lin Landsman, MD;  Location: [facility];  Service: Gastroenterology;  Laterality: N/A;   MASS BIOPSY N/A 03/08/2020   Procedure: NECK MASS BIOPSY;  Surgeon: Jules Husbands, MD;  Location: [facility] ORS;  Service: General;  Laterality: N/A;   PORTACATH PLACEMENT N/A 03/08/2020   Procedure: INSERTION PORT-A-CATH;  Surgeon: Jules Husbands, MD;   Location: [facility] ORS;  Service: General;  Laterality: N/A;    HEMATOLOGY/ONCOLOGY HISTORY:  Oncology History  Primary cancer of esophagus with metastasis to other site ([place])  03/07/2020 Initial Diagnosis   Primary cancer of esophagus with metastasis to other site [facility])   03/22/2020 -  Chemotherapy   The patient had dexamethasone (DECADRON) 4 MG tablet, 8 mg, Oral, Daily, 1 of 1 cycle, Start date: --, End date: -- palonosetron (ALOXI) injection 0.25 mg, 0.25 mg, Intravenous,  Once, 1 of 6 cycles Administration: 0.25 mg (03/22/2020), 0.25 mg (04/05/2020) leucovorin 550 mg in dextrose 5 % 250 mL infusion, 548 mg, Intravenous,  Once, 1 of 6 cycles Administration: 550 mg (03/22/2020) oxaliplatin (ELOXATIN) 115 mg in dextrose 5 % 500 mL chemo infusion, 85 mg/m2 = 115 mg, Intravenous,  Once, 1 of 6 cycles Administration: 115 mg (03/22/2020), 115 mg (04/05/2020) fluorouracil (ADRUCIL) chemo injection 550 mg, 400 mg/m2 = 550 mg, Intravenous,  Once, 1 of 6 cycles Administration: 550 mg (03/22/2020), 550 mg (04/05/2020) fluorouracil (ADRUCIL) 3,300 mg in sodium chloride 0.9 % 84 mL chemo infusion, 2,400 mg/m2 = 3,300 mg, Intravenous, 1 Day/Dose, 1 of 6 cycles Administration: 3,300 mg (03/22/2020), 3,300 mg (04/05/2020) nivolumab (OPDIVO) 480 mg in sodium chloride 0.9 % 100 mL chemo infusion, 480 mg, Intravenous, Once, 1 of 6 cycles  for chemotherapy treatment.      ALLERGIES:  has No Known Allergies.  MEDICATIONS:  Current Outpatient Medications  Medication Sig Dispense Refill   acetaminophen (TYLENOL) 325 MG tablet Place 2 tablets (650 mg total) into feeding tube every 6 (six) hours as needed for  mild pain (or Fever >/= 101). (Patient not taking: Reported on 03/21/2020)     fentaNYL (DURAGESIC) 25 MCG/HR Place 1 patch onto the skin every 3 (three) days. To help with pain (Patient not taking: Reported on 04/05/2020) 5 patch 0   lidocaine-prilocaine (EMLA) cream Apply to affected area once (Patient  not taking: Reported on 04/05/2020) 30 g 3   metoCLOPramide (REGLAN) 5 MG/5ML solution Take 5 mLs (5 mg total) by mouth 4 (four) times daily -  before meals and at bedtime. (Patient not taking: Reported on 03/21/2020) 600 mL 0   Morphine Sulfate (MORPHINE CONCENTRATE) 10 MG/0.5ML SOLN concentrated solution Take 0.25 mLs (5 mg total) by mouth every 4 (four) hours as needed for severe pain. (Patient not taking: Reported on 03/21/2020) 30 mL 0   nicotine (NICODERM CQ - DOSED IN MG/24 HOURS) 21 mg/24hr patch One 46m patch chest wall daily (okay to substitute generic) (Patient not taking: Reported on 03/21/2020) 28 patch 0   Nutritional Supplements (FEEDING SUPPLEMENT, OSMOLITE 1.5 CAL,) LIQD Place 237 mLs into feeding tube 5 (five) times daily.  0   ondansetron (ZOFRAN ODT) 4 MG disintegrating tablet Take 1 tablet (4 mg total) by mouth every 8 (eight) hours as needed for nausea or vomiting. (Patient not taking: Reported on 03/21/2020) 20 tablet 0   Water For Irrigation, Sterile (FREE WATER) SOLN Place 100 mLs into feeding tube 5 (five) times daily. 15000 mL 0   No current facility-administered medications for this visit.    VITAL SIGNS: There were no vitals taken for this visit. There were no vitals filed for this visit.  Estimated body mass index is 16.61 kg/m as calculated from the following:   Height as of 04/05/20: _0  (1.549 m).   Weight as of 04/07/20: 87 lb 14.4 oz (39.9 kg).  LABS: CBC:    Component Value Date/Time   WBC 9.3 04/05/2020 0911   HGB 13.2 04/05/2020 0911   HGB 10.6 (L) 02/15/2020 1553   HCT 41.9 04/05/2020 0911   HCT 33.5 (L) 02/15/2020 1553   PLT 328 04/05/2020 0911   PLT 611 (H) 02/15/2020 1553   MCV 80.4 04/05/2020 0911   MCV 78 (L) 02/15/2020 1553   NEUTROABS 6.6 04/05/2020 0911   LYMPHSABS 1.6 04/05/2020 0911   MONOABS 0.8 04/05/2020 0911   EOSABS 0.1 04/05/2020 0911   BASOSABS 0.0 04/05/2020 0911   Comprehensive Metabolic Panel:    Component Value  Date/Time   NA 141 04/05/2020 0911   NA 140 02/15/2020 1553   K 3.4 (L) 04/05/2020 0911   CL 98 04/05/2020 0911   CO2 30 04/05/2020 0911   BUN 77 (H) 04/05/2020 0911   BUN 5 (L) 02/15/2020 1553   CREATININE 1.59 (H) 04/05/2020 0911   GLUCOSE 182 (H) 04/05/2020 0911   CALCIUM 10.0 04/05/2020 0911   AST 28 04/05/2020 0911   ALT 19 04/05/2020 0911   ALKPHOS 112 04/05/2020 0911   BILITOT 0.5 04/05/2020 0911   BILITOT <0.2 02/15/2020 1553   PROT 9.0 (H) 04/05/2020 0911   PROT 6.8 02/15/2020 1553   ALBUMIN 3.9 04/05/2020 0911   ALBUMIN 3.8 02/15/2020 1553    RADIOGRAPHIC STUDIES: NM PET Image Initial (PI) Skull Base To Thigh  Result Date: 03/23/2020 CLINICAL DATA:  Initial treatment strategy for esophageal cancer. EXAM: NUCLEAR MEDICINE PET SKULL BASE TO THIGH TECHNIQUE: 5.0 mCi F-18 FDG was injected intravenously. Full-ring PET imaging was performed from the skull base to thigh after the radiotracer. CT data  was obtained and used for attenuation correction and anatomic localization. Fasting blood glucose: 108 mg/dl COMPARISON:  03/06/2020 FINDINGS: Mediastinal blood pool activity: SUV max 2.5 Liver activity: SUV max NA NECK: No hypermetabolic lymph nodes in the neck. Incidental CT findings: none CHEST: Mass involving the distal esophagus is again noted. This extends from the subcarinal region to the level of the GE junction measuring 4.0 x 2.9 by 10.2 cm. The SUV max is equal to 16.7. The proximal esophagus is dilated and fluid-filled. FDG avid thoracic lymph nodes are noted including: -FDG avid right supraclavicular lymph node measures 3.6 x 3.2 cm and has an SUV max of 11.2, image 26/4. -Necrotic high right paratracheal lymph node measures 3.1 cm within SUV max of 7.6 cm, image 29/4. -0.7 cm left paratracheal lymph node has an SUV max of 4.18, image 44/4. -Retroesophageal lymph node at the level of the aortic arch measures 0.6 cm within SUV max of 5.49, image 44/4. -Mild FDG uptake within the  right hilar lymph node has an SUV max of 4.8, image 49/4. No FDG avid pulmonary nodule or mass. Incidental CT findings: none ABDOMEN/PELVIS: No abnormal FDG uptake within the liver, spleen, pancreas, or adrenal glands. Gastrohepatic ligament lymph node(s) are FDG avid. Index node measures 1.1 cm and has an SUV max of 7.97, image 79/4. Incidental CT findings: Left upper quadrant gastrostomy tube in place. Aortic atherosclerosis without aneurysm. SKELETON: No abnormal FDG uptake identified within the imaged portions of the axial and appendicular skeleton. Incidental CT findings: none IMPRESSION: 1. Intense FDG uptake is associated with the obstructing mass involving the mid and distal esophagus. 2. Signs of right supraclavicular, bilateral mediastinal, right hilar, and gastrohepatic ligament FDG avid nodal metastasis. 3. No signs of pulmonary metastasis or solid organ metastasis within the abdomen. 4.  Aortic Atherosclerosis (ICD10-I70.0). Electronically Signed   By: Kerby Moors M.D.   On: 03/23/2020 14:39    PERFORMANCE STATUS (ECOG) : 2 - Symptomatic, <50% confined to bed  Review of Systems Unless otherwise noted, a complete review of systems is negative.  Physical Exam General: NAD Pulmonary: Unlabored Abdomen: PEG noted GU: no suprapubic tenderness Extremities: no edema, no joint deformities Skin: no rashes Neurological: Weakness but otherwise nonfocal  IMPRESSION: Met with patient following his XRT treatment.  I introduced palliative care services and attempted to establish therapeutic rapport.  Patient is currently on palliative XRT and FOLFOX chemotherapy with nivolumab.  He says that his goals are aligned with ongoing treatment.  Symptomatically, he has had significant difficulty managing oral secretions.  He reports that this is particularly severe at night when he lays flat.  This causes him to choke on his saliva.  During the day, he manages saliva by continuous spitting into a cup.   Discussed option of sleeping on a wedge pillow or in a recliner.  Discussed with Dr. Janese Banks and will trial scopolamine patch for secretion management.  Patient says that he has some pain in his neck for which he is taking morphine elixir.  He states that is primarily using the morphine at bedtime.  He endorses constipation but says that he is having a bowel movement every other day or so.  He says that he is taking the bowel regimen as outlined previously by Dr. Janese Banks.  Patient reports that he is now self managing his PEG feeds.  Previously his nephew's wife was managing but patient states that they had a "falling out."  Patient admits that he could use more help  at home.  Will consult palliative care social worker to explore home resources.  Patient does have Medicaid.  I wonder if he would benefit from the PACE program?  I sent patient home with ACP documents and a MOST form to discuss with family.  PLAN: -Continue current scope of treatment -Start scopolamine -Continue morphine elixir as needed for pain -Continue bowel regimen -Referral for home palliative care social worker -ACP/MOST form reviewed -RTC 1 to 2 weeks  Case and plan discussed with Dr. Janese Banks   Patient expressed understanding and was in agreement with this plan. He also understands that He can call the clinic at any time with any questions, concerns, or complaints.     Time Total: 30 minutes  Visit consisted of counseling and education dealing with the complex and emotionally intense issues of symptom management and palliative care in the setting of serious and potentially life-threatening illness.Greater than 50%  of this time was spent counseling and coordinating care related to the above assessment and plan.  Signed by: Altha Harm, PhD, NP-C

## 2020-04-19 NOTE — Telephone Encounter (Signed)
Spoke with patient's nephew [name] and have scheduled an In-person Consult for 05/06/20 @ 10 :30 AM. COVID screening was negative. One dog in home. Nephew will put dog away before NP arrives.  Patient lives with nephew and nephew's wife. Consent obtained; updated Outlook/Netsmart/Team List and Epic

## 2020-04-20 ENCOUNTER — Ambulatory Visit
Admission: RE | Admit: 2020-04-20 | Discharge: 2020-04-20 | Disposition: A | Discharge status: Home / Self Care | Payer: Medicaid Other | Source: Ambulatory Visit | Attending: Radiation Oncology | Admitting: Radiation Oncology

## 2020-04-20 DIAGNOSIS — Z510 Encounter for antineoplastic radiation therapy: Secondary | ICD-10-CM | POA: Diagnosis not present

## 2020-04-25 ENCOUNTER — Ambulatory Visit
Admission: RE | Admit: 2020-04-25 | Discharge: 2020-04-25 | Disposition: A | Discharge status: Home / Self Care | Payer: Medicaid Other | Source: Ambulatory Visit | Attending: Radiation Oncology | Admitting: Radiation Oncology

## 2020-04-25 DIAGNOSIS — Z510 Encounter for antineoplastic radiation therapy: Secondary | ICD-10-CM | POA: Diagnosis not present

## 2020-04-26 ENCOUNTER — Telehealth: Payer: Self-pay | Admitting: Oncology

## 2020-04-26 ENCOUNTER — Inpatient Hospital Stay: Payer: Medicaid Other

## 2020-04-26 ENCOUNTER — Other Ambulatory Visit: Payer: Self-pay

## 2020-04-26 ENCOUNTER — Inpatient Hospital Stay (HOSPITAL_BASED_OUTPATIENT_CLINIC_OR_DEPARTMENT_OTHER): Payer: Medicaid Other | Admitting: Oncology

## 2020-04-26 ENCOUNTER — Encounter: Payer: Self-pay | Admitting: Oncology

## 2020-04-26 ENCOUNTER — Inpatient Hospital Stay: Payer: Medicaid Other | Attending: Oncology

## 2020-04-26 ENCOUNTER — Telehealth: Payer: Self-pay | Admitting: General Practice

## 2020-04-26 ENCOUNTER — Ambulatory Visit
Admission: RE | Admit: 2020-04-26 | Discharge: 2020-04-26 | Disposition: A | Discharge status: Home / Self Care | Payer: Medicaid Other | Source: Ambulatory Visit | Attending: Radiation Oncology | Admitting: Radiation Oncology

## 2020-04-26 ENCOUNTER — Inpatient Hospital Stay (HOSPITAL_BASED_OUTPATIENT_CLINIC_OR_DEPARTMENT_OTHER): Payer: Medicaid Other | Admitting: Hospice and Palliative Medicine

## 2020-04-26 ENCOUNTER — Other Ambulatory Visit: Payer: Self-pay | Admitting: *Deleted

## 2020-04-26 VITALS — BP 93/67 | HR 86 | Temp 98.3°F | Resp 16 | Ht 61.0 in | Wt 87.9 lb

## 2020-04-26 DIAGNOSIS — Z515 Encounter for palliative care: Secondary | ICD-10-CM | POA: Diagnosis not present

## 2020-04-26 DIAGNOSIS — Z5112 Encounter for antineoplastic immunotherapy: Secondary | ICD-10-CM | POA: Diagnosis not present

## 2020-04-26 DIAGNOSIS — Z510 Encounter for antineoplastic radiation therapy: Secondary | ICD-10-CM | POA: Diagnosis not present

## 2020-04-26 DIAGNOSIS — C155 Malignant neoplasm of lower third of esophagus: Secondary | ICD-10-CM | POA: Diagnosis not present

## 2020-04-26 DIAGNOSIS — Z5111 Encounter for antineoplastic chemotherapy: Secondary | ICD-10-CM

## 2020-04-26 DIAGNOSIS — C159 Malignant neoplasm of esophagus, unspecified: Secondary | ICD-10-CM | POA: Diagnosis not present

## 2020-04-26 LAB — COMPREHENSIVE METABOLIC PANEL
ALT: 14 U/L (ref 0–44)
AST: 22 U/L (ref 15–41)
Albumin: 3.1 g/dL — ABNORMAL LOW (ref 3.5–5.0)
Alkaline Phosphatase: 101 U/L (ref 38–126)
Anion gap: 10 (ref 5–15)
BUN: 17 mg/dL (ref 8–23)
CO2: 30 mmol/L (ref 22–32)
Calcium: 9.7 mg/dL (ref 8.9–10.3)
Chloride: 91 mmol/L — ABNORMAL LOW (ref 98–111)
Creatinine, Ser: 0.61 mg/dL (ref 0.61–1.24)
GFR, Estimated: >60 mL/min (ref >60)
Glucose, Bld: 144 mg/dL — ABNORMAL HIGH (ref 70–99)
Potassium: 3.6 mmol/L (ref 3.5–5.1)
Sodium: 131 mmol/L — ABNORMAL LOW (ref 135–145)
Total Bilirubin: 0.5 mg/dL (ref 0.3–1.2)
Total Protein: 8 g/dL (ref 6.5–8.1)

## 2020-04-26 LAB — CBC WITH DIFFERENTIAL/PLATELET
Abs Immature Granulocytes: 3.67 10*3/uL — ABNORMAL HIGH (ref 0.00–0.07)
Basophils Absolute: 0 10*3/uL (ref 0.0–0.1)
Basophils Relative: 0 %
Eosinophils Absolute: 0 10*3/uL (ref 0.0–0.5)
Eosinophils Relative: 0 %
HCT: 35.9 % — ABNORMAL LOW (ref 39.0–52.0)
Hemoglobin: 11.7 g/dL — ABNORMAL LOW (ref 13.0–17.0)
Immature Granulocytes: 20 %
Lymphocytes Relative: 5 %
Lymphs Abs: 0.8 10*3/uL (ref 0.7–4.0)
MCH: 26.7 pg (ref 26.0–34.0)
MCHC: 32.6 g/dL (ref 30.0–36.0)
MCV: 81.8 fL (ref 80.0–100.0)
Monocytes Absolute: 2.6 10*3/uL — ABNORMAL HIGH (ref 0.1–1.0)
Monocytes Relative: 14 %
Neutro Abs: 11.5 10*3/uL — ABNORMAL HIGH (ref 1.7–7.7)
Neutrophils Relative %: 61 %
Platelets: 434 10*3/uL — ABNORMAL HIGH (ref 150–400)
RBC: 4.39 MIL/uL (ref 4.22–5.81)
RDW: 19.3 % — ABNORMAL HIGH (ref 11.5–15.5)
Smear Review: NORMAL
WBC: 18.6 10*3/uL — ABNORMAL HIGH (ref 4.0–10.5)
nRBC: 0 % (ref 0.0–0.2)

## 2020-04-26 LAB — TSH: TSH: 1.885 u[IU]/mL (ref 0.350–4.500)

## 2020-04-26 MED ORDER — PALONOSETRON HCL INJECTION 0.25 MG/5ML
0.2500 mg | Freq: Once | INTRAVENOUS | Status: AC
  Administered 2020-04-26: 0.25 mg via INTRAVENOUS
  Filled 2020-04-26: qty 5

## 2020-04-26 MED ORDER — LEUCOVORIN CALCIUM INJECTION 100 MG
20.0000 mg/m2 | Freq: Once | INTRAMUSCULAR | Status: AC
  Administered 2020-04-26: 26 mg via INTRAVENOUS
  Filled 2020-04-26: qty 1.3

## 2020-04-26 MED ORDER — SCOPOLAMINE 1 MG/3DAYS TD PT72
1.0000 | MEDICATED_PATCH | TRANSDERMAL | 12 refills | Status: DC
Start: 2020-04-26 — End: 2020-05-10

## 2020-04-26 MED ORDER — SODIUM CHLORIDE 0.9 % IV SOLN
480.0000 mg | Freq: Once | INTRAVENOUS | Status: AC
  Administered 2020-04-26: 480 mg via INTRAVENOUS
  Filled 2020-04-26: qty 48

## 2020-04-26 MED ORDER — LEUCOVORIN CALCIUM INJECTION 350 MG: 400.0000 mg/m2 | Freq: Once | INTRAVENOUS | Status: DC

## 2020-04-26 MED ORDER — SODIUM CHLORIDE 0.9 % IV SOLN
2400.0000 mg/m2 | INTRAVENOUS | Status: DC
  Administered 2020-04-26: 3300 mg via INTRAVENOUS
  Filled 2020-04-26: qty 66

## 2020-04-26 MED ORDER — SODIUM CHLORIDE 0.9% FLUSH
10.0000 mL | Freq: Once | INTRAVENOUS | Status: AC
  Administered 2020-04-26: 10 mL via INTRAVENOUS
  Filled 2020-04-26: qty 10

## 2020-04-26 MED ORDER — HEPARIN SOD (PORK) LOCK FLUSH 100 UNIT/ML IV SOLN
500.0000 [IU] | Freq: Once | INTRAVENOUS | Status: DC
  Filled 2020-04-26: qty 5

## 2020-04-26 MED ORDER — OXALIPLATIN CHEMO INJECTION 100 MG/20ML
85.0000 mg/m2 | Freq: Once | INTRAVENOUS | Status: AC
  Administered 2020-04-26: 115 mg via INTRAVENOUS
  Filled 2020-04-26: qty 20

## 2020-04-26 MED ORDER — FLUOROURACIL CHEMO INJECTION 2.5 GM/50ML
400.0000 mg/m2 | Freq: Once | INTRAVENOUS | Status: AC
  Administered 2020-04-26: 550 mg via INTRAVENOUS
  Filled 2020-04-26: qty 11

## 2020-04-26 MED ORDER — MORPHINE SULFATE (CONCENTRATE) 10 MG/0.5ML PO SOLN: 5.0000 mg | ORAL | 0 refills | Status: DC | PRN

## 2020-04-26 MED ORDER — SODIUM CHLORIDE 0.9 % IV SOLN
10.0000 mg | Freq: Once | INTRAVENOUS | Status: AC
  Administered 2020-04-26: 10 mg via INTRAVENOUS
  Filled 2020-04-26: qty 10

## 2020-04-26 MED ORDER — DEXTROSE 5 % IV SOLN
Freq: Once | INTRAVENOUS | Status: AC
  Filled 2020-04-26: qty 250

## 2020-04-26 NOTE — Progress Notes (Signed)
Pt received folfox + nivolumab tx today. Tolerated well. Pump connected and infusing at d/c.

## 2020-04-26 NOTE — Progress Notes (Signed)
[place]  Telephone:([phone] [phone]   Name: Andrew Pratt Date: 04/26/2020 MRN: [id number]  DOB: 07/25/1955  Patient Care Team: Patient, No Pcp Per as PCP - General (General Practice) Clent Jacks, RN as Oncology Nurse Navigator Sindy Guadeloupe, MD as Consulting Physician (Hematology and Oncology)    REASON FOR CONSULTATION: Andrew Pratt is a 64 y.o. 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  Patient is status post PEG tube placement.  He has had significant symptom burden with dysphagia and difficulty managing oral secretions.  He is also have pain and constipation.  Palliative care was consulted help address goals and manage ongoing symptoms.  SOCIAL HISTORY:     reports that he has quit smoking. His smoking use included cigarettes. He smoked 0.50 packs per day. He has never used smokeless tobacco. He reports previous alcohol use. He reports previous drug use.   Patient was never married has no children.  He lives with a nephew and nephew's wife.  He has 12 brothers and sisters.  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  ADVANCE DIRECTIVES:  Does not have  CODE STATUS:   PAST MEDICAL HISTORY: Past Medical History:  Diagnosis Date  . esophageal cancer     PAST SURGICAL HISTORY:  Past Surgical History:  Procedure Laterality Date  . ESOPHAGOGASTRODUODENOSCOPY (EGD) WITH PROPOFOL N/A 03/02/2020   Procedure: ESOPHAGOGASTRODUODENOSCOPY (EGD) WITH PROPOFOL;  Surgeon: Lin Landsman, MD;  Location: [facility];  Service: Gastroenterology;  Laterality: N/A;  . GASTROSTOMY TUBE PLACEMENT  03/08/2020  . MASS BIOPSY N/A 03/08/2020   Procedure: NECK MASS BIOPSY;  Surgeon: Jules Husbands, MD;  Location: [facility] ORS;  Service: General;   Laterality: N/A;  . PORTACATH PLACEMENT N/A 03/08/2020   Procedure: INSERTION PORT-A-CATH;  Surgeon: Jules Husbands, MD;  Location: [facility] ORS;  Service: General;  Laterality: N/A;    HEMATOLOGY/ONCOLOGY HISTORY:  Oncology History  Primary cancer of esophagus with metastasis to other site ([place])  03/07/2020 Initial Diagnosis   Primary cancer of esophagus with metastasis to other site [facility])   03/22/2020 -  Chemotherapy   The patient had dexamethasone (DECADRON) 4 MG tablet, 8 mg, Oral, Daily, 1 of 1 cycle, Start date: --, End date: -- palonosetron (ALOXI) injection 0.25 mg, 0.25 mg, Intravenous,  Once, 2 of 6 cycles Administration: 0.25 mg (03/22/2020), 0.25 mg (04/05/2020) leucovorin 550 mg in dextrose 5 % 250 mL infusion, 548 mg, Intravenous,  Once, 2 of 6 cycles Administration: 550 mg (03/22/2020) oxaliplatin (ELOXATIN) 115 mg in dextrose 5 % 500 mL chemo infusion, 85 mg/m2 = 115 mg, Intravenous,  Once, 2 of 6 cycles Administration: 115 mg (03/22/2020), 115 mg (04/05/2020) fluorouracil (ADRUCIL) chemo injection 550 mg, 400 mg/m2 = 550 mg, Intravenous,  Once, 2 of 6 cycles Administration: 550 mg (03/22/2020), 550 mg (04/05/2020) fluorouracil (ADRUCIL) 3,300 mg in sodium chloride 0.9 % 84 mL chemo infusion, 2,400 mg/m2 = 3,300 mg, Intravenous, 1 Day/Dose, 2 of 6 cycles Administration: 3,300 mg (03/22/2020), 3,300 mg (04/05/2020) nivolumab (OPDIVO) 480 mg in sodium chloride 0.9 % 100 mL chemo infusion, 480 mg, Intravenous, Once, 2 of 6 cycles  for chemotherapy treatment.      ALLERGIES:  has No Known Allergies.  MEDICATIONS:  Current Outpatient Medications  Medication Sig Dispense Refill  . acetaminophen (TYLENOL) 325 MG  tablet Place 2 tablets (650 mg total) into feeding tube every 6 (six) hours as needed for mild pain (or Fever >/= 101).    . fentaNYL (DURAGESIC) 25 MCG/HR Place 1 patch onto the skin every 3 (three) days. To help with pain (Patient not taking: Reported on 04/05/2020) 5  patch 0  . lidocaine-prilocaine (EMLA) cream Apply to affected area once (Patient not taking: Reported on 04/05/2020) 30 g 3  . metoCLOPramide (REGLAN) 5 MG/5ML solution Take 5 mLs (5 mg total) by mouth 4 (four) times daily -  before meals and at bedtime. (Patient not taking: Reported on 03/21/2020) 600 mL 0  . Morphine Sulfate (MORPHINE CONCENTRATE) 10 MG/0.5ML SOLN concentrated solution Take 0.25 mLs (5 mg total) by mouth every 4 (four) hours as needed for severe pain. 30 mL 0  . nicotine (NICODERM CQ - DOSED IN MG/24 HOURS) 21 mg/24hr patch One 21mg  patch chest wall daily (okay to substitute generic) (Patient not taking: Reported on 03/21/2020) 28 patch 0  . Nutritional Supplements (FEEDING SUPPLEMENT, OSMOLITE 1.5 CAL,) LIQD Place 237 mLs into feeding tube 5 (five) times daily.  0  . ondansetron (ZOFRAN ODT) 4 MG disintegrating tablet Take 1 tablet (4 mg total) by mouth every 8 (eight) hours as needed for nausea or vomiting. (Patient not taking: Reported on 03/21/2020) 20 tablet 0  . scopolamine (TRANSDERM-SCOP) 1 MG/3DAYS Place 1 patch (1.5 mg total) onto the skin every 3 (three) days. Place patch behind ear to help with oral secretions. Leave on 3 days and then remove patch and place new patch behind other ear. (Patient not taking: Reported on 04/26/2020) 10 patch 12  . Water For Irrigation, Sterile (FREE WATER) SOLN Place 100 mLs into feeding tube 5 (five) times daily. 15000 mL 0   No current facility-administered medications for this visit.   Facility-Administered Medications Ordered in Other Visits  Medication Dose Route Frequency Provider Last Rate Last Admin  . fluorouracil (ADRUCIL) 3,300 mg in sodium chloride 0.9 % 84 mL chemo infusion  2,400 mg/m2 (Treatment Plan Recorded) Intravenous 1 day or 1 dose Sindy Guadeloupe, MD      . fluorouracil (ADRUCIL) chemo injection 550 mg  400 mg/m2 (Treatment Plan Recorded) Intravenous Once Sindy Guadeloupe, MD      . heparin lock flush 100 unit/mL  500  Units Intravenous Once Sindy Guadeloupe, MD      . leucovorin injection 26 mg  20 mg/m2 Intravenous Once Sindy Guadeloupe, MD      . nivolumab (OPDIVO) 480 mg in sodium chloride 0.9 % 100 mL chemo infusion  480 mg Intravenous Once Sindy Guadeloupe, MD      . oxaliplatin (ELOXATIN) 115 mg in dextrose 5 % 500 mL chemo infusion  85 mg/m2 (Treatment Plan Recorded) Intravenous Once Sindy Guadeloupe, MD        VITAL SIGNS: There were no vitals taken for this visit. There were no vitals filed for this visit.  Estimated body mass index is 16.61 kg/m as calculated from the following:   Height as of an earlier encounter on 04/26/20: 5\' 1"  (1.549 m).   Weight as of an earlier encounter on 04/26/20: 87 lb 14.4 oz (39.9 kg).  LABS: CBC:    Component Value Date/Time   WBC 18.6 (H) 04/26/2020 0832   HGB 11.7 (L) 04/26/2020 0832   HGB 10.6 (L) 02/15/2020 1553   HCT 35.9 (L) 04/26/2020 0832   HCT 33.5 (L) 02/15/2020 1553   PLT 434 (  H) 04/26/2020 0832   PLT 611 (H) 02/15/2020 1553   MCV 81.8 04/26/2020 0832   MCV 78 (L) 02/15/2020 1553   NEUTROABS 11.5 (H) 04/26/2020 0832   LYMPHSABS 0.8 04/26/2020 0832   MONOABS 2.6 (H) 04/26/2020 0832   EOSABS 0.0 04/26/2020 0832   BASOSABS 0.0 04/26/2020 0832   Comprehensive Metabolic Panel:    Component Value Date/Time   NA 131 (L) 04/26/2020 0832   NA 140 02/15/2020 1553   K 3.6 04/26/2020 0832   CL 91 (L) 04/26/2020 0832   CO2 30 04/26/2020 0832   BUN 17 04/26/2020 0832   BUN 5 (L) 02/15/2020 1553   CREATININE 0.61 04/26/2020 0832   GLUCOSE 144 (H) 04/26/2020 0832   CALCIUM 9.7 04/26/2020 0832   AST 22 04/26/2020 0832   ALT 14 04/26/2020 0832   ALKPHOS 101 04/26/2020 0832   BILITOT 0.5 04/26/2020 0832   BILITOT <0.2 02/15/2020 1553   PROT 8.0 04/26/2020 0832   PROT 6.8 02/15/2020 1553   ALBUMIN 3.1 (L) 04/26/2020 0832   ALBUMIN 3.8 02/15/2020 1553    RADIOGRAPHIC STUDIES: No results found.  PERFORMANCE STATUS (ECOG) : 2 - Symptomatic, <50%  confined to bed  Review of Systems Unless otherwise noted, a complete review of systems is negative.  Physical Exam General: NAD Pulmonary: Unlabored Abdomen: PEG noted GU: no suprapubic tenderness Extremities: no edema, no joint deformities Skin: no rashes Neurological: Weakness but otherwise nonfocal  IMPRESSION: Routine follow-up visit.  Patient seen in infusion.  Patient reports may be feeling slightly better but still complains of difficulty managing saliva.  He has not yet started the scopolamine and we can discuss this today.  Patient reports that he is doing reasonably well managing his own medications and tube feeds.  He denies severe symptomatic complaints today.  He is using as needed morphine twice daily and says that his pain is reasonably well controlled.  Patient asked about ACP documents that we discussed at last visit.  He is still talking with family.  PLAN: -Continue current scope of treatment -Start scopolamine -Continue morphine elixir as needed for pain -ACP/MOST form previously reviewed -RTC 3 weeks  Case and plan discussed with Dr. Janese Banks   Patient expressed understanding and was in agreement with this plan. He also understands that He can call the clinic at any time with any questions, concerns, or complaints.     Time Total: 15 minutes  Visit consisted of counseling and education dealing with the complex and emotionally intense issues of symptom management and palliative care in the setting of serious and potentially life-threatening illness.Greater than 50%  of this time was spent counseling and coordinating care related to the above assessment and plan.  Signed by: Altha Harm, PhD, NP-C

## 2020-04-26 NOTE — Progress Notes (Signed)
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 Marius Ditch as an outpatient for symptoms of dysphagia. He underwent EGD on 03/02/2020 which showed a large fungating and ulcerating mass with bleeding and stigmata of recent bleeding in the distal esophagus 30 cm from the incisors. The degree of obstruction was severe enough that the scope could not be passed beyond the distal esophagus. Patient was supposed to see me today as an outpatient but then got admitted to the hospital with severe dysphagia to the point that he was not able to swallow anythingand had a PEG tube placement.  CT chest abdomen pelvis with contrast shows a large irregular ulcerated mass in the distal third of the esophagus with proximal esophagus being dilated and fluid-filled. Bilateral paratracheal and mediastinal adenopathy noted. Also found to have a 2.6 cm right supraclavicular lymph node no evidence of liver or other organ metastases.Supraclavicular lymph node was biopsied and was consistent with squamous cell carcinoma  Plan is for palliative FOLFOX Opdivo treatment along with palliative radiation to the lower esophageal mass. CPS 15.  Interval history-patient is here with his sister today.  Reports that he does not have any significant pain at this time.  He has been using 5 cartons of tube  feeds a day.  He continues to have issues with constant spitting up his saliva and has not picked up his scopolamine patch yet.  Denies any significant nausea or vomiting  ECOG PS- 2 Pain scale- 0   Review of systems- Review of Systems  Constitutional: Positive for malaise/fatigue. Negative for chills, fever and weight loss.  HENT: Negative for congestion, ear discharge and nosebleeds.   Eyes: Negative for blurred vision.  Respiratory: Negative for cough, hemoptysis, sputum production, shortness of breath and wheezing.   Cardiovascular: Negative for chest pain, palpitations, orthopnea and claudication.  Gastrointestinal: Negative for abdominal pain, blood in stool, constipation, diarrhea, heartburn, melena, nausea and vomiting.  Genitourinary: Negative for dysuria, flank pain, frequency, hematuria and urgency.  Musculoskeletal: Negative for back pain, joint pain and myalgias.  Skin: Negative for rash.  Neurological: Negative for dizziness, tingling, focal weakness, seizures, weakness and headaches.  Endo/Heme/Allergies: Does not bruise/bleed easily.  Psychiatric/Behavioral: Negative for depression and suicidal ideas. The patient does not have insomnia.       No Known Allergies   Past Medical History:  Diagnosis Date  . esophageal cancer      Past Surgical History:  Procedure Laterality Date  . ESOPHAGOGASTRODUODENOSCOPY (EGD) WITH PROPOFOL N/A 03/02/2020   Procedure: ESOPHAGOGASTRODUODENOSCOPY (EGD) WITH PROPOFOL;  Surgeon: Lin Landsman, MD;  Location: [facility];  Service: Gastroenterology;  Laterality: N/A;  . GASTROSTOMY TUBE PLACEMENT  03/08/2020  . MASS BIOPSY N/A 03/08/2020   Procedure: NECK MASS BIOPSY;  Surgeon: Jules Husbands, MD;  Location: [facility];  Service: General;  Laterality: N/A;  . PORTACATH PLACEMENT N/A 03/08/2020   Procedure: INSERTION PORT-A-CATH;  Surgeon: Caroleen Hamman  F, MD;  Location: [facility];  Service: General;  Laterality: N/A;    Social  History   Socioeconomic History  . Marital status: Single    Spouse name: Not on file  . Number of children: Not on file  . Years of education: Not on file  . Highest education level: Not on file  Occupational History  . Not on file  Tobacco Use  . Smoking status: Former Smoker    Packs/day: 0.50    Types: Cigarettes  . Smokeless tobacco: Never Used  . Tobacco comment: QUIT 3 WKS AGO  Vaping Use  . Vaping Use: Never used  Substance and Sexual Activity  . Alcohol use: Not Currently  . Drug use: Not Currently  . Sexual activity: Not Currently  Other Topics Concern  . Not on file  Social History Narrative  . Not on file   Social Determinants of Health   Financial Resource Strain:   . Difficulty of Paying Living Expenses: Not on file  Food Insecurity:   . Worried About Charity fundraiser in the Last Year: Not on file  . Ran Out of Food in the Last Year: Not on file  Transportation Needs:   . Lack of Transportation (Medical): Not on file  . Lack of Transportation (Non-Medical): Not on file  Physical Activity:   . Days of Exercise per Week: Not on file  . Minutes of Exercise per Session: Not on file  Stress:   . Feeling of Stress : Not on file  Social Connections:   . Frequency of Communication with Friends and Family: Not on file  . Frequency of Social Gatherings with Friends and Family: Not on file  . Attends Religious Services: Not on file  . Active Member of Clubs or Organizations: Not on file  . Attends Archivist Meetings: Not on file  . Marital Status: Not on file  Intimate Partner Violence:   . Fear of Current or Ex-Partner: Not on file  . Emotionally Abused: Not on file  . Physically Abused: Not on file  . Sexually Abused: Not on file    Family History  Problem Relation Age of Onset  . Cancer Mother        lung     Current Outpatient Medications:  .  acetaminophen (TYLENOL) 325 MG tablet, Place 2 tablets (650 mg total) into feeding tube  every 6 (six) hours as needed for mild pain (or Fever >/= 101)., Disp: , Rfl:  .  Nutritional Supplements (FEEDING SUPPLEMENT, OSMOLITE 1.5 CAL,) LIQD, Place 237 mLs into feeding tube 5 (five) times daily., Disp: , Rfl: 0 .  Water For Irrigation, Sterile (FREE WATER) SOLN, Place 100 mLs into feeding tube 5 (five) times daily., Disp: 15000 mL, Rfl: 0 .  fentaNYL (DURAGESIC) 25 MCG/HR, Place 1 patch onto the skin every 3 (three) days. To help with pain (Patient not taking: Reported on 04/05/2020), Disp: 5 patch, Rfl: 0 .  lidocaine-prilocaine (EMLA) cream, Apply to affected area once (Patient not taking: Reported on 04/05/2020), Disp: 30 g, Rfl: 3 .  metoCLOPramide (REGLAN) 5 MG/5ML solution, Take 5 mLs (5 mg total) by mouth 4 (four) times daily -  before meals and at bedtime. (Patient not taking: Reported on 03/21/2020), Disp: 600 mL, Rfl: 0 .  Morphine Sulfate (MORPHINE CONCENTRATE) 10 MG/0.5ML SOLN concentrated solution, Take 0.25 mLs (5 mg total) by mouth every 4 (four) hours as needed for severe pain., Disp: 30 mL, Rfl: 0 .  nicotine (NICODERM CQ - DOSED IN MG/24 HOURS) 21 mg/24hr patch, One 21mg  patch chest wall daily (okay to substitute generic) (Patient not taking: Reported on 03/21/2020), Disp: 28 patch, Rfl: 0 .  ondansetron (ZOFRAN ODT) 4 MG disintegrating tablet, Take 1 tablet (4 mg total) by mouth every 8 (eight) hours as needed for nausea or vomiting. (Patient not taking: Reported on 03/21/2020), Disp: 20 tablet, Rfl: 0 .  scopolamine (TRANSDERM-SCOP) 1 MG/3DAYS, Place 1 patch (1.5 mg total) onto the skin every 3 (three) days. Place patch behind ear to help with oral secretions. Leave on 3 days and then remove patch and place new patch behind other ear., Disp: 10 patch, Rfl: 12 No current facility-administered medications for this visit.  Facility-Administered Medications Ordered in Other Visits:  .  fluorouracil (ADRUCIL) 3,300 mg in sodium chloride 0.9 % 84 mL chemo infusion, 2,400 mg/m2  (Treatment Plan Recorded), Intravenous, 1 day or 1 dose, Sindy Guadeloupe, MD, 3,300 mg at 04/26/20 1440 .  heparin lock flush 100 unit/mL, 500 Units, Intravenous, Once, Sindy Guadeloupe, MD  Physical exam:  Vitals:   04/26/20 0916  BP: 93/67  Pulse: 86  Resp: 16  Temp: 98.3 F (36.8 C)  TempSrc: Oral  Weight: 87 lb 14.4 oz (39.9 kg)  Height: 5\' 1"  (1.549 m)   Physical Exam Constitutional:      General: He is not in acute distress.    Comments: He is sitting in a wheelchair and spits up saliva every few minutes  Cardiovascular:     Rate and Rhythm: Normal rate and regular rhythm.     Heart sounds: Normal heart sounds.  Pulmonary:     Effort: Pulmonary effort is normal.     Breath sounds: Normal breath sounds.  Abdominal:     General: Bowel sounds are normal.     Palpations: Abdomen is soft.     Comments: PEG tube in place  Lymphadenopathy:     Comments: Chronic palpable right supraclavicular lymph node  Skin:    General: Skin is warm and dry.  Neurological:     Mental Status: He is alert and oriented to person, place, and time.      CMP Latest Ref Rng & Units 04/26/2020  Glucose 70 - 99 mg/dL 144(H)  BUN 8 - 23 mg/dL 17  Creatinine 0.61 - 1.24 mg/dL 0.61  Sodium 135 - 145 mmol/L 131(L)  Potassium 3.5 - 5.1 mmol/L 3.6  Chloride 98 - 111 mmol/L 91(L)  CO2 22 - 32 mmol/L 30  Calcium 8.9 - 10.3 mg/dL 9.7  Total Protein 6.5 - 8.1 g/dL 8.0  Total Bilirubin 0.3 - 1.2 mg/dL 0.5  Alkaline Phos 38 - 126 U/L 101  AST 15 - 41 U/L 22  ALT 0 - 44 U/L 14   CBC Latest Ref Rng & Units 04/26/2020  WBC 4.0 - 10.5 K/uL 18.6(H)  Hemoglobin 13.0 - 17.0 g/dL 11.7(L)  Hematocrit 39 - 52 % 35.9(L)  Platelets 150 - 400 K/uL 434(H)    Assessment and plan- Patient is a 65 y.o. male squamous cell carcinoma of the lower third of the esophagus with supraclavicular lymph node metastases.  He is here for on treatment assessment prior to cycle 3 of palliative FOLFOX chemotherapy and  Opdivo  Patient will receive a cycle 1 of Opdivo chemotherapy along with cycle 3 of FOLFOX chemotherapy.  Pump disconnect on day 3.  Patient is also receiving palliative radiation to his lower esophageal mass which is causing complete  obstruction.  He completes radiation therapy on 04/13/2020.  I will see him back in 2 weeks time with CBC with differential and CMP for cycle 4 of FOLFOX chemotherapy.  Patient constantly spitting up saliva likely secondary to complete obstruction because of which she is unable to swallow.  Hopefully should get better after radiation therapy.  He will also pick up scopolamine patch today to see if it would help reduce some of his secretions.   Visit Diagnosis 1. Encounter for antineoplastic chemotherapy   2. Encounter for antineoplastic immunotherapy   3. Malignant neoplasm of lower third of esophagus (HCC)      Dr. Randa Evens, MD, MPH [facility] at [facility] [phone] 04/26/2020 4:01 PM

## 2020-04-26 NOTE — Telephone Encounter (Signed)
04/26/2020 Returned niece's call regarding transportation for her uncle. Scheduled pt on the van for 12 pm every day he has treatment, which is at 1 pm, starting 04/27/20. Verified pick up address and phone number, and informed them that an Uber/Lyft may be dispatched if there is not space on the van. Gave them the transportation number for them to call if they are waiting for more than 15 minutes or have any questions. Pt and niece confirmed all the information. SRW

## 2020-04-26 NOTE — Progress Notes (Signed)
Tube feeding going well. Can't swallow still. Stitches on rght side of g tube came off. No drainage or irritation around the tube. Pt says it hurts where stitches came off of g tube

## 2020-04-27 ENCOUNTER — Inpatient Hospital Stay: Payer: Medicaid Other | Attending: Oncology

## 2020-04-27 ENCOUNTER — Ambulatory Visit (INDEPENDENT_AMBULATORY_CARE_PROVIDER_SITE_OTHER): Payer: Medicaid Other | Admitting: Surgery

## 2020-04-27 ENCOUNTER — Encounter: Payer: Self-pay | Admitting: Surgery

## 2020-04-27 ENCOUNTER — Other Ambulatory Visit: Payer: Self-pay

## 2020-04-27 ENCOUNTER — Ambulatory Visit
Admission: RE | Admit: 2020-04-27 | Discharge: 2020-04-27 | Disposition: A | Discharge status: Home / Self Care | Payer: Medicaid Other | Source: Ambulatory Visit | Attending: Radiation Oncology | Admitting: Radiation Oncology

## 2020-04-27 VITALS — BP 105/75 | HR 83 | Temp 97.7°F | Ht 61.0 in | Wt 97.0 lb

## 2020-04-27 DIAGNOSIS — C155 Malignant neoplasm of lower third of esophagus: Secondary | ICD-10-CM | POA: Diagnosis present

## 2020-04-27 DIAGNOSIS — Z452 Encounter for adjustment and management of vascular access device: Secondary | ICD-10-CM | POA: Insufficient documentation

## 2020-04-27 DIAGNOSIS — Z510 Encounter for antineoplastic radiation therapy: Secondary | ICD-10-CM | POA: Diagnosis not present

## 2020-04-27 DIAGNOSIS — D701 Agranulocytosis secondary to cancer chemotherapy: Secondary | ICD-10-CM | POA: Insufficient documentation

## 2020-04-27 DIAGNOSIS — Z5112 Encounter for antineoplastic immunotherapy: Secondary | ICD-10-CM | POA: Insufficient documentation

## 2020-04-27 DIAGNOSIS — C770 Secondary and unspecified malignant neoplasm of lymph nodes of head, face and neck: Secondary | ICD-10-CM | POA: Insufficient documentation

## 2020-04-27 DIAGNOSIS — Z5111 Encounter for antineoplastic chemotherapy: Secondary | ICD-10-CM | POA: Insufficient documentation

## 2020-04-27 DIAGNOSIS — Z09 Encounter for follow-up examination after completed treatment for conditions other than malignant neoplasm: Secondary | ICD-10-CM

## 2020-04-27 DIAGNOSIS — C159 Malignant neoplasm of esophagus, unspecified: Secondary | ICD-10-CM | POA: Insufficient documentation

## 2020-04-27 DIAGNOSIS — Z5189 Encounter for other specified aftercare: Secondary | ICD-10-CM | POA: Insufficient documentation

## 2020-04-27 LAB — T4: T4, Total: 18.1 ug/dL — ABNORMAL HIGH (ref 4.5–12.0)

## 2020-04-27 NOTE — Patient Instructions (Signed)
Please call our office if you have questions or concerns.   

## 2020-04-28 ENCOUNTER — Telehealth: Payer: Self-pay

## 2020-04-28 ENCOUNTER — Inpatient Hospital Stay: Payer: Medicaid Other

## 2020-04-28 ENCOUNTER — Emergency Department
Admission: EM | Admit: 2020-04-28 | Discharge: 2020-04-28 | Disposition: A | Discharge status: Home / Self Care | Payer: Medicaid Other | Attending: Student in an Organized Health Care Education/Training Program | Admitting: Student in an Organized Health Care Education/Training Program

## 2020-04-28 ENCOUNTER — Emergency Department: Payer: Medicaid Other

## 2020-04-28 ENCOUNTER — Ambulatory Visit
Admission: RE | Admit: 2020-04-28 | Discharge: 2020-04-28 | Disposition: A | Discharge status: Home / Self Care | Payer: Medicaid Other | Source: Ambulatory Visit | Attending: Radiation Oncology | Admitting: Radiation Oncology

## 2020-04-28 ENCOUNTER — Other Ambulatory Visit: Payer: Self-pay

## 2020-04-28 ENCOUNTER — Telehealth: Payer: Self-pay | Admitting: General Practice

## 2020-04-28 VITALS — BP 105/67 | HR 64 | Temp 99.5°F | Resp 16

## 2020-04-28 DIAGNOSIS — Z452 Encounter for adjustment and management of vascular access device: Secondary | ICD-10-CM | POA: Diagnosis not present

## 2020-04-28 DIAGNOSIS — Z5112 Encounter for antineoplastic immunotherapy: Secondary | ICD-10-CM | POA: Diagnosis present

## 2020-04-28 DIAGNOSIS — C770 Secondary and unspecified malignant neoplasm of lymph nodes of head, face and neck: Secondary | ICD-10-CM | POA: Diagnosis not present

## 2020-04-28 DIAGNOSIS — Z8501 Personal history of malignant neoplasm of esophagus: Secondary | ICD-10-CM | POA: Insufficient documentation

## 2020-04-28 DIAGNOSIS — Z5189 Encounter for other specified aftercare: Secondary | ICD-10-CM | POA: Diagnosis not present

## 2020-04-28 DIAGNOSIS — Z0189 Encounter for other specified special examinations: Secondary | ICD-10-CM

## 2020-04-28 DIAGNOSIS — Z4659 Encounter for fitting and adjustment of other gastrointestinal appliance and device: Secondary | ICD-10-CM | POA: Insufficient documentation

## 2020-04-28 DIAGNOSIS — C159 Malignant neoplasm of esophagus, unspecified: Secondary | ICD-10-CM

## 2020-04-28 DIAGNOSIS — D701 Agranulocytosis secondary to cancer chemotherapy: Secondary | ICD-10-CM | POA: Diagnosis not present

## 2020-04-28 DIAGNOSIS — K9423 Gastrostomy malfunction: Secondary | ICD-10-CM

## 2020-04-28 DIAGNOSIS — Z510 Encounter for antineoplastic radiation therapy: Secondary | ICD-10-CM | POA: Diagnosis not present

## 2020-04-28 DIAGNOSIS — Z87891 Personal history of nicotine dependence: Secondary | ICD-10-CM | POA: Diagnosis not present

## 2020-04-28 DIAGNOSIS — Z5111 Encounter for antineoplastic chemotherapy: Secondary | ICD-10-CM | POA: Diagnosis present

## 2020-04-28 MED ORDER — HEPARIN SOD (PORK) LOCK FLUSH 100 UNIT/ML IV SOLN
INTRAVENOUS | Status: AC
  Filled 2020-04-28: qty 5

## 2020-04-28 MED ORDER — SODIUM CHLORIDE 0.9 % IV SOLN
INTRAVENOUS | Status: DC
  Filled 2020-04-28 (×2): qty 250

## 2020-04-28 MED ORDER — SODIUM CHLORIDE 0.9% FLUSH
10.0000 mL | INTRAVENOUS | Status: DC | PRN
  Filled 2020-04-28: qty 10

## 2020-04-28 MED ORDER — HEPARIN SOD (PORK) LOCK FLUSH 100 UNIT/ML IV SOLN
500.0000 [IU] | Freq: Once | INTRAVENOUS | Status: AC | PRN
  Administered 2020-04-28: 500 [IU]
  Filled 2020-04-28: qty 5

## 2020-04-28 MED ORDER — DIATRIZOATE MEGLUMINE & SODIUM 66-10 % PO SOLN
30.0000 mL | Freq: Once | ORAL | Status: AC
  Administered 2020-04-28: 30 mL via ORAL

## 2020-04-28 NOTE — Telephone Encounter (Signed)
Received a call from [place] at [facility]. She has this patient over there and he states that after seeing Dr Dahlia Byes yesterday that his peg tube has come dislodged and he is unable to do his feeding. I spoke with Dr Dahlia Byes and he ordered a peg tube check and he will see the patient in office tomorrow. Patient notified to be at [facility] entrance tomorrow at 8:00 am and then follow up here with Dr Dahlia Byes at 9:30 am.  The cancer center is setting him up for Lucianne Lei transportation tomorrow for his tube check.

## 2020-04-28 NOTE — ED Provider Notes (Signed)
[facility] Emergency Department Provider Note    First MD Initiated Contact with Patient 04/28/20 1953     (approximate)  I have reviewed the triage vital signs and the nursing notes.   HISTORY  Chief Complaint Gtube displaced    HPI Andrew Pratt is a 64 y.o. male with history of esophageal cancer with an existing PEG tube in place presents to the ER for evaluation of displaced G-tube occurred this morning. G-tube has been in place for several months.    Past Medical History:  Diagnosis Date  . esophageal cancer    Family History  Problem Relation Age of Onset  . Cancer Mother        lung   Past Surgical History:  Procedure Laterality Date  . ESOPHAGOGASTRODUODENOSCOPY (EGD) WITH PROPOFOL N/A 03/02/2020   Procedure: ESOPHAGOGASTRODUODENOSCOPY (EGD) WITH PROPOFOL;  Surgeon: Lin Landsman, MD;  Location: [facility];  Service: Gastroenterology;  Laterality: N/A;  . GASTROSTOMY TUBE PLACEMENT  03/08/2020  . MASS BIOPSY N/A 03/08/2020   Procedure: NECK MASS BIOPSY;  Surgeon: Jules Husbands, MD;  Location: [facility];  Service: General;  Laterality: N/A;  . PORTACATH PLACEMENT N/A 03/08/2020   Procedure: INSERTION PORT-A-CATH;  Surgeon: Jules Husbands, MD;  Location: [facility];  Service: General;  Laterality: N/A;   Patient Active Problem List   Diagnosis Date Noted  . Iron deficiency anemia 03/24/2020  . Hypophosphatemia   . Aspiration pneumonia of both lower lobes ([place])   . Weakness   . Protein-calorie malnutrition, severe 03/07/2020  . Primary cancer of esophagus with metastasis to other site [facility])   . Goals of care, counseling/discussion   . Normocytic anemia   . Esophageal obstruction 03/06/2020  . Difficulty swallowing 03/05/2020  . Hypokalemia 03/05/2020  . Leukocytosis 03/05/2020  . Tobacco abuse 03/05/2020  . Malignant neoplasm of lower third of esophagus ([place])   . Esophageal dysphagia       Prior to Admission  medications   Medication Sig Start Date End Date Taking? Authorizing Provider  acetaminophen (TYLENOL) 325 MG tablet Place 2 tablets (650 mg total) into feeding tube every 6 (six) hours as needed for mild pain (or Fever >/= 101). 03/12/20   Loletha Grayer, MD  fentaNYL (DURAGESIC) 25 MCG/HR Place 1 patch onto the skin every 3 (three) days. To help with pain 03/21/20   Fulp, Cammie, MD  lidocaine-prilocaine (EMLA) cream Apply to affected area once 03/22/20   Sindy Guadeloupe, MD  metoCLOPramide (REGLAN) 5 MG/5ML solution Take 5 mLs (5 mg total) by mouth 4 (four) times daily -  before meals and at bedtime. Patient not taking: Reported on 03/21/2020 03/12/20 04/11/20  Loletha Grayer, MD  Morphine Sulfate (MORPHINE CONCENTRATE) 10 MG/0.5ML SOLN concentrated solution Take 0.25 mLs (5 mg total) by mouth every 4 (four) hours as needed for severe pain. 04/26/20   Sindy Guadeloupe, MD  nicotine (NICODERM CQ - DOSED IN MG/24 HOURS) 21 mg/24hr patch One 21mg  patch chest wall daily (okay to substitute generic) 03/12/20   Loletha Grayer, MD  Nutritional Supplements (FEEDING SUPPLEMENT, OSMOLITE 1.5 CAL,) LIQD Place 237 mLs into feeding tube 5 (five) times daily. 03/12/20   Loletha Grayer, MD  ondansetron (ZOFRAN ODT) 4 MG disintegrating tablet Take 1 tablet (4 mg total) by mouth every 8 (eight) hours as needed for nausea or vomiting. 03/12/20   Loletha Grayer, MD  PROAIR HFA 108 [phone] Base) MCG/ACT inhaler Inhale into the lungs. 04/12/20   [provider]  scopolamine (TRANSDERM-SCOP) 1 MG/3DAYS Place 1 patch (1.5 mg total) onto the skin every 3 (three) days. Place patch behind ear to help with oral secretions. Leave on 3 days and then remove patch and place new patch behind other ear. 04/26/20   Borders, Kirt Boys, NP  Water For Irrigation, Sterile (FREE WATER) SOLN Place 100 mLs into feeding tube 5 (five) times daily. 03/12/20   Loletha Grayer, MD    Allergies Patient has no known  allergies.    Social History Social History   Tobacco Use  . Smoking status: Former Smoker    Packs/day: 0.50    Types: Cigarettes  . Smokeless tobacco: Never Used  . Tobacco comment: QUIT 3 WKS AGO  Vaping Use  . Vaping Use: Never used  Substance Use Topics  . Alcohol use: Not Currently  . Drug use: Not Currently    Review of Systems Patient denies headaches, rhinorrhea, blurry vision, numbness, shortness of breath, chest pain, edema, cough, abdominal pain, nausea, vomiting, diarrhea, dysuria, fevers, rashes or hallucinations unless otherwise stated above in HPI. ____________________________________________   PHYSICAL EXAM:  VITAL SIGNS: Vitals:   04/28/20 1915 04/28/20 1919  BP:  95/76  Pulse: 90 (!) 50  Resp: 18 16  Temp: 97.8 F (36.6 C)   SpO2:  98%    Constitutional: Alert and oriented.  Eyes: Conjunctivae are normal.  Head: Atraumatic. Nose: No congestion/rhinnorhea. Mouth/Throat: Mucous membranes are moist.   Neck: No stridor. Painless ROM.  Cardiovascular: Normal rate, regular rhythm. Grossly normal heart sounds.  Good peripheral circulation. Respiratory: Normal respiratory effort.  No retractions. Lungs CTAB. Gastrointestinal: Soft, displaced G-tube ostomy present without any evidence of bleeding or surrounding fluctuance. No distention. No abdominal bruits. No CVA tenderness. Genitourinary:  Musculoskeletal: No lower extremity tenderness nor edema.  No joint effusions. Neurologic:  Normal speech and language. No gross focal neurologic deficits are appreciated. No facial droop Skin:  Skin is warm, dry and intact. No rash noted. Psychiatric: Mood and affect are normal. Speech and behavior are normal.  ____________________________________________   LABS (all labs ordered are listed, but only abnormal results are displayed)  No results found for this or any previous visit (from the past 24  hour(s)). ____________________________________________ ____________________________________________  [id number]  I personally reviewed all radiographic images ordered to evaluate for the above acute complaints and reviewed radiology reports and findings.  These findings were personally discussed with the patient.  Please see medical record for radiology report.  ____________________________________________   PROCEDURES  Procedure(s) performed:  Procedures    Critical Care performed: no ____________________________________________   INITIAL IMPRESSION / ASSESSMENT AND PLAN / ED COURSE  Pertinent labs & imaging results that were available during my care of the patient were reviewed by me and considered in my medical decision making (see chart for details).   DDX: Displaced G-tube, perforation  Andrew Pratt is a 64 y.o. who presents to the ED with presentation as described above. G-tube displaced. It was replaced with 63 [place] as I was unable to pass the 18 or 47 [place] but placement was confirmed with x-ray. Stable and appropriate for outpatient follow-up     The patient was evaluated in Emergency Department today for the symptoms described in the history of present illness. He/she was evaluated in the context of the global COVID-19 pandemic, which necessitated consideration that the patient might be at risk for infection with the SARS-CoV-2 virus that causes COVID-19. Institutional protocols and algorithms that pertain to the  evaluation of patients at risk for COVID-19 are in a state of rapid change based on information released by regulatory bodies including the CDC and federal and state organizations. These policies and algorithms were followed during the patient's care in the ED.  As part of my medical decision making, I reviewed the following data within the [place] notes reviewed and incorporated, Labs reviewed, notes from prior ED visits and [place]  Controlled Substance Database   ____________________________________________   FINAL CLINICAL IMPRESSION(S) / ED DIAGNOSES  Final diagnoses:  Encounter for imaging study to confirm gastrojejunal tube placement      NEW MEDICATIONS STARTED DURING THIS VISIT:  New Prescriptions   No medications on file     Note:  This document was prepared using Dragon voice recognition software and may include unintentional dictation errors.    Merlyn Lot, MD 04/28/20 2117

## 2020-04-28 NOTE — ED Triage Notes (Signed)
Pt to ED after Gtube fell out today. Andrew Pratt has been placed since September. No drainage form site and no pain reported by patient.

## 2020-04-28 NOTE — Telephone Encounter (Signed)
Patient called and states that he is at home and his peg tube is completely out. I spoke with Dr Dahlia Byes and he said for the patient to report to the emergency room to have a new tube placed. Patient notified and will go now. Tube check canceled for tomorrow at [facility] and also his appointment here.

## 2020-04-28 NOTE — Progress Notes (Signed)
Andrew Pratt is status post port placement as well as robotic G-tube.  He does have metastatic esophageal cancer.  No fevers or chills.  There was concerned that the stitch came off the bolster.  He is tolerating tube feeds.  Physical exam: He is debilitated and malnourished Abdomen: Incisions healing well tube in place.  The stitch is detached from the skin but the bolster is intact, I retracted the tube to have to balloon lay closer to abd wall.  No real need for stitch   Evidence of infection  A/p Doing well w/o complications RTC prn Cussed with him about the importance of keeping the bolster taut against abdominal wall around 3 cm

## 2020-04-29 ENCOUNTER — Ambulatory Visit: Payer: Medicaid Other | Admitting: Surgery

## 2020-04-29 ENCOUNTER — Inpatient Hospital Stay: Payer: Medicaid Other

## 2020-04-29 ENCOUNTER — Other Ambulatory Visit: Payer: Self-pay

## 2020-04-29 ENCOUNTER — Ambulatory Visit
Admission: RE | Admit: 2020-04-29 | Discharge: 2020-04-29 | Disposition: A | Discharge status: Home / Self Care | Payer: Medicaid Other | Source: Ambulatory Visit | Attending: Radiation Oncology | Admitting: Radiation Oncology

## 2020-04-29 ENCOUNTER — Ambulatory Visit: Payer: Medicaid Other

## 2020-04-29 DIAGNOSIS — Z510 Encounter for antineoplastic radiation therapy: Secondary | ICD-10-CM | POA: Diagnosis not present

## 2020-05-02 ENCOUNTER — Inpatient Hospital Stay: Payer: Medicaid Other

## 2020-05-02 ENCOUNTER — Ambulatory Visit
Admission: RE | Admit: 2020-05-02 | Discharge: 2020-05-02 | Disposition: A | Discharge status: Home / Self Care | Payer: Medicaid Other | Source: Ambulatory Visit | Attending: Radiation Oncology | Admitting: Radiation Oncology

## 2020-05-02 ENCOUNTER — Other Ambulatory Visit: Payer: Self-pay

## 2020-05-02 DIAGNOSIS — Z510 Encounter for antineoplastic radiation therapy: Secondary | ICD-10-CM | POA: Diagnosis not present

## 2020-05-03 ENCOUNTER — Ambulatory Visit
Admission: RE | Admit: 2020-05-03 | Discharge: 2020-05-03 | Disposition: A | Discharge status: Home / Self Care | Payer: Medicaid Other | Source: Ambulatory Visit | Attending: Radiation Oncology | Admitting: Radiation Oncology

## 2020-05-03 ENCOUNTER — Inpatient Hospital Stay: Payer: Medicaid Other

## 2020-05-03 ENCOUNTER — Other Ambulatory Visit: Payer: Self-pay

## 2020-05-03 DIAGNOSIS — Z510 Encounter for antineoplastic radiation therapy: Secondary | ICD-10-CM | POA: Diagnosis not present

## 2020-05-04 ENCOUNTER — Ambulatory Visit
Admission: RE | Admit: 2020-05-04 | Discharge: 2020-05-04 | Disposition: A | Discharge status: Home / Self Care | Payer: Medicaid Other | Source: Ambulatory Visit | Attending: Radiation Oncology | Admitting: Radiation Oncology

## 2020-05-04 ENCOUNTER — Other Ambulatory Visit: Payer: Self-pay

## 2020-05-04 ENCOUNTER — Inpatient Hospital Stay: Payer: Medicaid Other

## 2020-05-04 DIAGNOSIS — Z510 Encounter for antineoplastic radiation therapy: Secondary | ICD-10-CM | POA: Diagnosis not present

## 2020-05-05 ENCOUNTER — Ambulatory Visit
Admission: RE | Admit: 2020-05-05 | Discharge: 2020-05-05 | Disposition: A | Discharge status: Home / Self Care | Payer: Medicaid Other | Source: Ambulatory Visit | Attending: Radiation Oncology | Admitting: Radiation Oncology

## 2020-05-05 ENCOUNTER — Inpatient Hospital Stay: Payer: Medicaid Other

## 2020-05-05 ENCOUNTER — Other Ambulatory Visit: Payer: Self-pay

## 2020-05-05 DIAGNOSIS — Z510 Encounter for antineoplastic radiation therapy: Secondary | ICD-10-CM | POA: Diagnosis not present

## 2020-05-06 ENCOUNTER — Telehealth: Payer: Self-pay | Admitting: Internal Medicine

## 2020-05-06 ENCOUNTER — Telehealth: Payer: Self-pay | Admitting: *Deleted

## 2020-05-06 ENCOUNTER — Ambulatory Visit: Payer: Medicaid Other

## 2020-05-06 ENCOUNTER — Other Ambulatory Visit: Payer: Medicaid Other | Admitting: Hospice

## 2020-05-06 ENCOUNTER — Inpatient Hospital Stay: Payer: Medicaid Other

## 2020-05-06 ENCOUNTER — Other Ambulatory Visit: Payer: Self-pay

## 2020-05-06 DIAGNOSIS — R410 Disorientation, unspecified: Secondary | ICD-10-CM | POA: Insufficient documentation

## 2020-05-06 DIAGNOSIS — Z20822 Contact with and (suspected) exposure to covid-19: Secondary | ICD-10-CM | POA: Insufficient documentation

## 2020-05-06 DIAGNOSIS — K9423 Gastrostomy malfunction: Secondary | ICD-10-CM | POA: Insufficient documentation

## 2020-05-06 DIAGNOSIS — Z87891 Personal history of nicotine dependence: Secondary | ICD-10-CM | POA: Diagnosis not present

## 2020-05-06 NOTE — ED Triage Notes (Signed)
Pt back to ED after reporting his Gtube fell out again yesterday. Pt was seen in the 2nd for his Gtube falling out and reports he is not sure what happened this time. Pt does not have Gtube upon arrival. No drainage noted from site. Pt reporting he is unable to take food or drink PO.

## 2020-05-06 NOTE — Telephone Encounter (Signed)
Andrew Pratt called to say that the pt. Is not himself, not acting right and he pulled the g tube out. I called Dr Janese Banks and she says he needs to go to ER. Andrew Pratt was told the info and she will make sure he goes to ER

## 2020-05-06 NOTE — Telephone Encounter (Signed)
NP went to patient's home today for visit as scheduled; patient did not answer the door nor answer calls. NP called [name] who also called patient without success. [facility] CMA scheduler rescheduled visit for 06/01/2020.

## 2020-05-06 NOTE — Telephone Encounter (Signed)
Spoke to patient's niece who was concerned regarding patient's wait time at the emergency room.  Counseled that patient will be triaged appropriately based upon patient's clinical status/urgency etc.  GB

## 2020-05-07 ENCOUNTER — Emergency Department: Payer: Medicaid Other

## 2020-05-07 ENCOUNTER — Emergency Department
Admission: EM | Admit: 2020-05-07 | Discharge: 2020-05-07 | Disposition: A | Discharge status: Home / Self Care | Payer: Medicaid Other | Attending: Emergency Medicine | Admitting: Emergency Medicine

## 2020-05-07 DIAGNOSIS — R410 Disorientation, unspecified: Secondary | ICD-10-CM

## 2020-05-07 DIAGNOSIS — T85528A Displacement of other gastrointestinal prosthetic devices, implants and grafts, initial encounter: Secondary | ICD-10-CM

## 2020-05-07 LAB — CBC
HCT: 38 % — ABNORMAL LOW (ref 39.0–52.0)
Hemoglobin: 11.9 g/dL — ABNORMAL LOW (ref 13.0–17.0)
MCH: 27.4 pg (ref 26.0–34.0)
MCHC: 31.3 g/dL (ref 30.0–36.0)
MCV: 87.4 fL (ref 80.0–100.0)
Platelets: 100 10*3/uL — ABNORMAL LOW (ref 150–400)
RBC: 4.35 MIL/uL (ref 4.22–5.81)
RDW: 20.2 % — ABNORMAL HIGH (ref 11.5–15.5)
WBC: 7.6 10*3/uL (ref 4.0–10.5)
nRBC: 0 % (ref 0.0–0.2)

## 2020-05-07 LAB — COMPREHENSIVE METABOLIC PANEL
ALT: 20 U/L (ref 0–44)
AST: 38 U/L (ref 15–41)
Albumin: 3.4 g/dL — ABNORMAL LOW (ref 3.5–5.0)
Alkaline Phosphatase: 85 U/L (ref 38–126)
Anion gap: 16 — ABNORMAL HIGH (ref 5–15)
BUN: 29 mg/dL — ABNORMAL HIGH (ref 8–23)
CO2: 22 mmol/L (ref 22–32)
Calcium: 10.1 mg/dL (ref 8.9–10.3)
Chloride: 97 mmol/L — ABNORMAL LOW (ref 98–111)
Creatinine, Ser: 0.86 mg/dL (ref 0.61–1.24)
GFR, Estimated: >60 mL/min (ref >60)
Glucose, Bld: 93 mg/dL (ref 70–99)
Potassium: 4.6 mmol/L (ref 3.5–5.1)
Sodium: 135 mmol/L (ref 135–145)
Total Bilirubin: 0.8 mg/dL (ref 0.3–1.2)
Total Protein: 8.2 g/dL — ABNORMAL HIGH (ref 6.5–8.1)

## 2020-05-07 LAB — URINALYSIS, ROUTINE W REFLEX MICROSCOPIC
Bacteria, UA: NONE SEEN
Bilirubin Urine: NEGATIVE
Glucose, UA: NEGATIVE mg/dL
Hgb urine dipstick: NEGATIVE
Ketones, ur: 5 mg/dL — AB
Leukocytes,Ua: NEGATIVE
Nitrite: NEGATIVE
Protein, ur: 30 mg/dL — AB
Specific Gravity, Urine: 1.027 (ref 1.005–1.030)
pH: 5 (ref 5.0–8.0)

## 2020-05-07 LAB — RESP PANEL BY RT-PCR (FLU A&B, COVID) ARPGX2
Influenza A by PCR: NEGATIVE
Influenza B by PCR: NEGATIVE
SARS Coronavirus 2 by RT PCR: NEGATIVE

## 2020-05-07 NOTE — Discharge Instructions (Addendum)
You have been seen today for a dislodged G-tube.  A new 14 [place] G-tube has been placed.  Return to the emergency department for any symptoms personally concerning to yourself.  Your Covid test was negative.  Your blood work did not show any significant problems to cause her confusion.  CT scan of her head showed no signs of stroke.  Continue normal care at home return to the ED with any acutely worsening symptoms.

## 2020-05-07 NOTE — ED Provider Notes (Addendum)
[facility] Emergency Department Provider Note  Time seen: 3:33 AM  I have reviewed the triage vital signs and the nursing notes.   HISTORY  Chief Complaint G tube pulled out   HPI Andrew Pratt is a 64 y.o. male with a past medical history of esophageal cancer, generalized weakness, presents to the emergency department with a dislodged G-tube.  Patient was seen approximately 1 week ago for G-tube placement, states it fell out again last night.  He is not sure why it fell out or what happened.  Did not bring the tube with him.  Patient states he is G-tube dependent and cannot eat or drink by mouth.  Largely negative review of systems otherwise.  No chest pain or abdominal pain.   Past Medical History:  Diagnosis Date  . esophageal cancer     Patient Active Problem List   Diagnosis Date Noted  . Iron deficiency anemia 03/24/2020  . Hypophosphatemia   . Aspiration pneumonia of both lower lobes ([place])   . Weakness   . Protein-calorie malnutrition, severe 03/07/2020  . Primary cancer of esophagus with metastasis to other site [facility])   . Goals of care, counseling/discussion   . Normocytic anemia   . Esophageal obstruction 03/06/2020  . Difficulty swallowing 03/05/2020  . Hypokalemia 03/05/2020  . Leukocytosis 03/05/2020  . Tobacco abuse 03/05/2020  . Malignant neoplasm of lower third of esophagus ([place])   . Esophageal dysphagia     Past Surgical History:  Procedure Laterality Date  . ESOPHAGOGASTRODUODENOSCOPY (EGD) WITH PROPOFOL N/A 03/02/2020   Procedure: ESOPHAGOGASTRODUODENOSCOPY (EGD) WITH PROPOFOL;  Surgeon: Lin Landsman, MD;  Location: [place];  Service: Gastroenterology;  Laterality: N/A;  . GASTROSTOMY TUBE PLACEMENT  03/08/2020  . MASS BIOPSY N/A 03/08/2020   Procedure: NECK MASS BIOPSY;  Surgeon: Jules Husbands, MD;  Location: [facility] ORS;  Service: General;  Laterality: N/A;  . PORTACATH PLACEMENT N/A 03/08/2020   Procedure:  INSERTION PORT-A-CATH;  Surgeon: Jules Husbands, MD;  Location: [facility] ORS;  Service: General;  Laterality: N/A;    Prior to Admission medications   Medication Sig Start Date End Date Taking? Authorizing Provider  acetaminophen (TYLENOL) 325 MG tablet Place 2 tablets (650 mg total) into feeding tube every 6 (six) hours as needed for mild pain (or Fever >/= 101). 03/12/20   Loletha Grayer, MD  fentaNYL (DURAGESIC) 25 MCG/HR Place 1 patch onto the skin every 3 (three) days. To help with pain 03/21/20   Fulp, Cammie, MD  lidocaine-prilocaine (EMLA) cream Apply to affected area once 03/22/20   Sindy Guadeloupe, MD  metoCLOPramide (REGLAN) 5 MG/5ML solution Take 5 mLs (5 mg total) by mouth 4 (four) times daily -  before meals and at bedtime. Patient not taking: Reported on 03/21/2020 03/12/20 04/11/20  Loletha Grayer, MD  Morphine Sulfate (MORPHINE CONCENTRATE) 10 MG/0.5ML SOLN concentrated solution Take 0.25 mLs (5 mg total) by mouth every 4 (four) hours as needed for severe pain. 04/26/20   Sindy Guadeloupe, MD  nicotine (NICODERM CQ - DOSED IN MG/24 HOURS) 21 mg/24hr patch One 21mg  patch chest wall daily (okay to substitute generic) 03/12/20   Loletha Grayer, MD  Nutritional Supplements (FEEDING SUPPLEMENT, OSMOLITE 1.5 CAL,) LIQD Place 237 mLs into feeding tube 5 (five) times daily. 03/12/20   Loletha Grayer, MD  ondansetron (ZOFRAN ODT) 4 MG disintegrating tablet Take 1 tablet (4 mg total) by mouth every 8 (eight) hours as needed for nausea or vomiting. 03/12/20  Loletha Grayer, MD  [facility] HFA 108 [phone] Base) MCG/ACT inhaler Inhale into the lungs. 04/12/20   [provider]  scopolamine (TRANSDERM-SCOP) 1 MG/3DAYS Place 1 patch (1.5 mg total) onto the skin every 3 (three) days. Place patch behind ear to help with oral secretions. Leave on 3 days and then remove patch and place new patch behind other ear. 04/26/20   Borders, Kirt Boys, NP  Water For Irrigation, Sterile (FREE WATER) SOLN  Place 100 mLs into feeding tube 5 (five) times daily. 03/12/20   Loletha Grayer, MD    No Known Allergies  Family History  Problem Relation Age of Onset  . Cancer Mother        lung    Social History Social History   Tobacco Use  . Smoking status: Former Smoker    Packs/day: 0.50    Types: Cigarettes  . Smokeless tobacco: Never Used  . Tobacco comment: QUIT 3 WKS AGO  Vaping Use  . Vaping Use: Never used  Substance Use Topics  . Alcohol use: Not Currently  . Drug use: Not Currently    Review of Systems Constitutional: Negative for fever. Cardiovascular: Negative for chest pain. Respiratory: Negative for shortness of breath. Gastrointestinal: Negative for abdominal pain.  Dislodged G-tube. Musculoskeletal: Negative for musculoskeletal complaints Neurological: Negative for headache All other ROS negative  ____________________________________________   PHYSICAL EXAM:  VITAL SIGNS: ED Triage Vitals [05/06/20 1932]  Enc Vitals Group     BP 95/66     Pulse Rate (!) 103     Resp 16     Temp 98.6 F (37 C)     Temp Source Oral     SpO2 99 %     Weight 97 lb (44 kg)     Height 5\' 1"  (1.549 m)     Head Circumference      Peak Flow      Pain Score 0     Pain Loc      Pain Edu?      Excl. in [place]?    Constitutional: Patient is awake and alert, no distress. Eyes: Normal exam ENT      Head: Normocephalic and atraumatic.      Mouth/Throat: Mucous membranes are moist. Cardiovascular: Normal rate, regular rhythm. Respiratory: Normal respiratory effort without tachypnea nor retractions. Breath sounds are clear  Gastrointestinal: Soft and nontender. No distention.  G-tube opening present.  No bleeding. Musculoskeletal: Nontender with normal range of motion in all extremities. Neurologic:  Normal speech and language. No gross focal neurologic deficits  Skin:  Skin is warm, dry and intact.  Psychiatric: Mood and affect are normal.    ____________________________________________   INITIAL IMPRESSION / ASSESSMENT AND PLAN / ED COURSE  Pertinent labs & imaging results that were available during my care of the patient were reviewed by me and considered in my medical decision making (see chart for details).   Patient presents to the emergency department for dislodged G-tube.  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  Overall the patient appears well.  We will attempt to dilate the G-tube opening and replace appears to be fairly small in diameter.  No other complaints.  Patient agreeable to plan of care.  Initially unable to place 82 [place] G-tube due to opening size.  I was able to obtain a urethral dilation get dilate up to 16 [place], 14 [place] G-tube placed easily after this.  Flushed with water without any discomfort.  Functioning properly and patient safe  for discharge home.   Upon attempted discharge the family states they brought the patient not only because his G-tube dislodgment also because he has been more confused recently, walking around the house at all times, etc..  Here the patient does appear mildly confused at times but is able to give a history he was able to tell me that his G-tube fell out was able to tell me his history of esophageal cancer.  However given this complaint by family we will check labs including blood work, urinalysis chest x-ray and CT scan of the head.  Patient care signed out to oncoming provider.  Andrew Pratt was evaluated in Emergency Department on 05/07/2020 for the symptoms described in the history of present illness. He was evaluated in the context of the global COVID-19 pandemic, which necessitated consideration that the patient might be at risk for infection with the SARS-CoV-2 virus that causes COVID-19. Institutional protocols and algorithms that pertain to the evaluation of patients at risk for COVID-19 are in a state of rapid change based on information released by  regulatory bodies including the CDC and federal and state organizations. These policies and algorithms were followed during the patient's care in the ED.  ____________________________________________   FINAL CLINICAL IMPRESSION(S) / ED DIAGNOSES  G-tube dislodgment   Harvest Dark, MD 05/07/20 0507    Harvest Dark, MD 05/07/20 [phone]

## 2020-05-07 NOTE — ED Notes (Signed)
Pt assisted to bedside commode to provide urine sample.

## 2020-05-07 NOTE — ED Notes (Signed)
Patient transported to CT 

## 2020-05-07 NOTE — ED Notes (Signed)
Called pt niece to notify her of pt d/c. She states that she brought the patient here for his G tube and also the fact he has been more confused lately at home and would like this evaluated. EDP made aware of the same.

## 2020-05-07 NOTE — ED Notes (Signed)
This RN on phone speaking with pt's niece about discharge and results.

## 2020-05-07 NOTE — ED Notes (Signed)
This RN at bedside attempting to obtain urine sample. Pt alert but nonverbal with this RN. Bedside urinal given to pt to provide urine sample.

## 2020-05-09 ENCOUNTER — Other Ambulatory Visit: Payer: Self-pay

## 2020-05-09 ENCOUNTER — Ambulatory Visit
Admission: RE | Admit: 2020-05-09 | Discharge: 2020-05-09 | Disposition: A | Discharge status: Home / Self Care | Payer: Medicaid Other | Source: Ambulatory Visit | Attending: Radiation Oncology | Admitting: Radiation Oncology

## 2020-05-09 ENCOUNTER — Inpatient Hospital Stay: Payer: Medicaid Other

## 2020-05-09 DIAGNOSIS — Z510 Encounter for antineoplastic radiation therapy: Secondary | ICD-10-CM | POA: Diagnosis not present

## 2020-05-10 ENCOUNTER — Inpatient Hospital Stay: Payer: Medicaid Other

## 2020-05-10 ENCOUNTER — Inpatient Hospital Stay (HOSPITAL_BASED_OUTPATIENT_CLINIC_OR_DEPARTMENT_OTHER): Payer: Medicaid Other | Admitting: Oncology

## 2020-05-10 ENCOUNTER — Ambulatory Visit
Admission: RE | Admit: 2020-05-10 | Discharge: 2020-05-10 | Disposition: A | Discharge status: Home / Self Care | Payer: Medicaid Other | Source: Ambulatory Visit | Attending: Radiation Oncology | Admitting: Radiation Oncology

## 2020-05-10 ENCOUNTER — Other Ambulatory Visit: Payer: Self-pay | Admitting: *Deleted

## 2020-05-10 ENCOUNTER — Inpatient Hospital Stay (HOSPITAL_BASED_OUTPATIENT_CLINIC_OR_DEPARTMENT_OTHER): Payer: Medicaid Other | Admitting: Hospice and Palliative Medicine

## 2020-05-10 ENCOUNTER — Telehealth: Payer: Self-pay

## 2020-05-10 ENCOUNTER — Encounter: Payer: Self-pay | Admitting: Oncology

## 2020-05-10 VITALS — BP 94/68 | HR 78 | Temp 98.2°F | Resp 16 | Wt 86.2 lb

## 2020-05-10 DIAGNOSIS — C159 Malignant neoplasm of esophagus, unspecified: Secondary | ICD-10-CM

## 2020-05-10 DIAGNOSIS — Z515 Encounter for palliative care: Secondary | ICD-10-CM | POA: Diagnosis not present

## 2020-05-10 DIAGNOSIS — D701 Agranulocytosis secondary to cancer chemotherapy: Secondary | ICD-10-CM

## 2020-05-10 DIAGNOSIS — Z510 Encounter for antineoplastic radiation therapy: Secondary | ICD-10-CM | POA: Diagnosis not present

## 2020-05-10 DIAGNOSIS — T451X5A Adverse effect of antineoplastic and immunosuppressive drugs, initial encounter: Secondary | ICD-10-CM

## 2020-05-10 DIAGNOSIS — Z5112 Encounter for antineoplastic immunotherapy: Secondary | ICD-10-CM | POA: Diagnosis not present

## 2020-05-10 LAB — CBC WITH DIFFERENTIAL/PLATELET
Abs Immature Granulocytes: 0.09 10*3/uL — ABNORMAL HIGH (ref 0.00–0.07)
Basophils Absolute: 0 10*3/uL (ref 0.0–0.1)
Basophils Relative: 1 %
Eosinophils Absolute: 0.1 10*3/uL (ref 0.0–0.5)
Eosinophils Relative: 5 %
HCT: 32.5 % — ABNORMAL LOW (ref 39.0–52.0)
Hemoglobin: 10.5 g/dL — ABNORMAL LOW (ref 13.0–17.0)
Immature Granulocytes: 6 %
Lymphocytes Relative: 12 %
Lymphs Abs: 0.2 10*3/uL — ABNORMAL LOW (ref 0.7–4.0)
MCH: 27.4 pg (ref 26.0–34.0)
MCHC: 32.3 g/dL (ref 30.0–36.0)
MCV: 84.9 fL (ref 80.0–100.0)
Monocytes Absolute: 0.4 10*3/uL (ref 0.1–1.0)
Monocytes Relative: 28 %
Neutro Abs: 0.8 10*3/uL — ABNORMAL LOW (ref 1.7–7.7)
Neutrophils Relative %: 48 %
Platelets: 200 10*3/uL (ref 150–400)
RBC: 3.83 MIL/uL — ABNORMAL LOW (ref 4.22–5.81)
RDW: 19.9 % — ABNORMAL HIGH (ref 11.5–15.5)
Smear Review: NORMAL
WBC: 1.6 10*3/uL — ABNORMAL LOW (ref 4.0–10.5)
nRBC: 0 % (ref 0.0–0.2)

## 2020-05-10 LAB — COMPREHENSIVE METABOLIC PANEL
ALT: 17 U/L (ref 0–44)
AST: 31 U/L (ref 15–41)
Albumin: 2.8 g/dL — ABNORMAL LOW (ref 3.5–5.0)
Alkaline Phosphatase: 68 U/L (ref 38–126)
Anion gap: 12 (ref 5–15)
BUN: 18 mg/dL (ref 8–23)
CO2: 25 mmol/L (ref 22–32)
Calcium: 9.2 mg/dL (ref 8.9–10.3)
Chloride: 97 mmol/L — ABNORMAL LOW (ref 98–111)
Creatinine, Ser: 0.56 mg/dL — ABNORMAL LOW (ref 0.61–1.24)
GFR, Estimated: >60 mL/min (ref >60)
Glucose, Bld: 170 mg/dL — ABNORMAL HIGH (ref 70–99)
Potassium: 3.7 mmol/L (ref 3.5–5.1)
Sodium: 134 mmol/L — ABNORMAL LOW (ref 135–145)
Total Bilirubin: 0.5 mg/dL (ref 0.3–1.2)
Total Protein: 7.2 g/dL (ref 6.5–8.1)

## 2020-05-10 LAB — PATHOLOGIST SMEAR REVIEW

## 2020-05-10 MED ORDER — SODIUM CHLORIDE 0.9% FLUSH
10.0000 mL | INTRAVENOUS | Status: AC | PRN
  Administered 2020-05-10: 09:00:00 10 mL via INTRAVENOUS
  Filled 2020-05-10: qty 10

## 2020-05-10 MED ORDER — AZITHROMYCIN 250 MG PO TABS: ORAL_TABLET | ORAL | 0 refills | Status: DC

## 2020-05-10 MED ORDER — MELATONIN 1 MG/ML PO LIQD: 2.0000 mg | Freq: Every evening | ORAL | 3 refills | Status: DC | PRN

## 2020-05-10 NOTE — Progress Notes (Signed)
[place]  [phone] [phone]   Name: Andrew Pratt Date: 05/10/2020 MRN: [id number]  DOB: [birth year]  Patient Care Team: Sindy Guadeloupe, MD as PCP - General (Oncology) Clent Jacks, RN as Oncology Nurse Navigator Sindy Guadeloupe, MD as Consulting Physician (Hematology and Oncology)    REASON FOR CONSULTATION: Andrew Pratt is a 64 y.o. 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  Patient is status post PEG tube placement.  He has had significant symptom burden with dysphagia and difficulty managing oral secretions.  He is also have pain and constipation.  Palliative care was consulted help address goals and manage ongoing symptoms.  SOCIAL HISTORY:     reports that he has quit smoking. His smoking use included cigarettes. He smoked 0.50 packs per day. He has never used smokeless tobacco. He reports previous alcohol use. He reports previous drug use.   Patient was never married has no children.  He lives with a nephew and nephew's wife.  He has 12 brothers and sisters.  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  ADVANCE DIRECTIVES:  Does not have  CODE STATUS:   PAST MEDICAL HISTORY: Past Medical History:  Diagnosis Date  . esophageal cancer     PAST SURGICAL HISTORY:  Past Surgical History:  Procedure Laterality Date  . ESOPHAGOGASTRODUODENOSCOPY (EGD) WITH PROPOFOL N/A 03/02/2020   Procedure: ESOPHAGOGASTRODUODENOSCOPY (EGD) WITH PROPOFOL;  Surgeon: Lin Landsman, MD;  Location: [place];  Service: Gastroenterology;  Laterality: N/A;  . GASTROSTOMY TUBE PLACEMENT  03/08/2020  . MASS BIOPSY N/A 03/08/2020   Procedure: NECK MASS BIOPSY;  Surgeon: Jules Husbands, MD;  Location: [facility] OR[facility];  Service: General;  Laterality: N/A;   . PORTACATH PLACEMENT N/A 03/08/2020   Procedure: INSERTION PORT-A-CATH;  Surgeon: Jules Husbands, MD;  Location: [facility] OR[facility];  Service: General;  Laterality: N/A;    HEMATOLOGY/ONCOLOGY HISTORY:  Oncology History  Primary cancer of esophagus with metastasis to other site ([place])  03/07/2020 Initial Diagnosis   Primary cancer of esophagus with metastasis to other site [facility])   03/22/2020 -  Chemotherapy   The patient had dexamethasone (DECADRON) 4 MG tablet, 8 mg, Oral, Daily, 1 of 1 cycle, Start date: --, End date: -- palonosetron (ALOXI) injection 0.25 mg, 0.25 mg, Intravenous,  Once, 2 of 6 cycles Administration: 0.25 mg (03/22/2020), 0.25 mg (04/05/2020), 0.25 mg (04/26/2020) leucovorin 550 mg in dextrose 5 % 250 mL infusion, 548 mg, Intravenous,  Once, 2 of 6 cycles Administration: 550 mg (03/22/2020) oxaliplatin (ELOXATIN) 115 mg in dextrose 5 % 500 mL chemo infusion, 85 mg/m2 = 115 mg, Intravenous,  Once, 2 of 6 cycles Administration: 115 mg (03/22/2020), 115 mg (04/05/2020), 115 mg (04/26/2020) fluorouracil (ADRUCIL) chemo injection 550 mg, 400 mg/m2 = 550 mg, Intravenous,  Once, 2 of 6 cycles Administration: 550 mg (03/22/2020), 550 mg (04/05/2020), 550 mg (04/26/2020) fluorouracil (ADRUCIL) 3,300 mg in sodium chloride 0.9 % 84 mL chemo infusion, 2,400 mg/m2 = 3,300 mg, Intravenous, 1 Day/Dose, 2 of 6 cycles Administration: 3,300 mg (03/22/2020), 3,300 mg (04/05/2020), 3,300 mg (04/26/2020) nivolumab (OPDIVO) 480 mg in sodium chloride 0.9 % 100 mL chemo infusion, 480 mg, Intravenous, Once, 2 of 6 cycles Administration: 480 mg (04/26/2020)  for chemotherapy treatment.      ALLERGIES:  has No Known Allergies.  MEDICATIONS:  Current Outpatient Medications  Medication Sig Dispense Refill  . acetaminophen (TYLENOL) 325 MG tablet Place 2 tablets (650 mg total) into feeding tube every 6 (six) hours as needed for mild pain (or Fever >/= 101). (Patient not taking: Reported on 05/10/2020)     . fentaNYL (DURAGESIC) 25 MCG/HR Place 1 patch onto the skin every 3 (three) days. To help with pain 5 patch 0  . lidocaine-prilocaine (EMLA) cream Apply to affected area once 30 g 3  . metoCLOPramide (REGLAN) 5 MG/5ML solution Take 5 mLs (5 mg total) by mouth 4 (four) times daily -  before meals and at bedtime. (Patient not taking: Reported on 03/21/2020) 600 mL 0  . Morphine Sulfate (MORPHINE CONCENTRATE) 10 MG/0.5ML SOLN concentrated solution Take 0.25 mLs (5 mg total) by mouth every 4 (four) hours as needed for severe pain. 30 mL 0  . nicotine (NICODERM CQ - DOSED IN MG/24 HOURS) 21 mg/24hr patch One 21mg  patch chest wall daily (okay to substitute generic) (Patient not taking: Reported on 05/10/2020) 28 patch 0  . Nutritional Supplements (FEEDING SUPPLEMENT, OSMOLITE 1.5 CAL,) LIQD Place 237 mLs into feeding tube 5 (five) times daily.  0  . ondansetron (ZOFRAN ODT) 4 MG disintegrating tablet Take 1 tablet (4 mg total) by mouth every 8 (eight) hours as needed for nausea or vomiting. 20 tablet 0  . [facility]AIR HFA 108 (90 Base) MCG/ACT inhaler Inhale into the lungs.    . Water For Irrigation, Sterile (FREE WATER) SOLN Place 100 mLs into feeding tube 5 (five) times daily. 15000 mL 0   No current facility-administered medications for this visit.   Facility-Administered Medications Ordered in Other Visits  Medication Dose Route Frequency Provider Last Rate Last Admin  . sodium chloride flush (NS) 0.9 % injection 10 mL  10 mL Intravenous PRN Sindy Guadeloupe, MD   10 mL at 05/10/20 2706    VITAL SIGNS: There were no vitals taken for this visit. There were no vitals filed for this visit.  Estimated body mass index is 16.29 kg/m as calculated from the following:   Height as of 05/06/20: 5\' 1"  (1.549 m).   Weight as of an earlier encounter on 05/10/20: 86 lb 3.2 oz (39.1 kg).  LABS: CBC:    Component Value Date/Time   WBC 1.6 (L) 05/10/2020 0833   HGB 10.5 (L) 05/10/2020 0833   HGB 10.6 (L)  02/15/2020 1553   HCT 32.5 (L) 05/10/2020 0833   HCT 33.5 (L) 02/15/2020 1553   PLT 200 05/10/2020 0833   PLT 611 (H) 02/15/2020 1553   MCV 84.9 05/10/2020 0833   MCV 78 (L) 02/15/2020 1553   NEUTROABS 0.8 (L) 05/10/2020 0833   LYMPHSABS 0.2 (L) 05/10/2020 0833   MONOABS 0.4 05/10/2020 0833   EOSABS 0.1 05/10/2020 0833   BASOSABS 0.0 05/10/2020 0833   Comprehensive Metabolic Panel:    Component Value Date/Time   NA 134 (L) 05/10/2020 0833   NA 140 02/15/2020 1553   K 3.7 05/10/2020 0833   CL 97 (L) 05/10/2020 0833   CO2 25 05/10/2020 0833   BUN 18 05/10/2020 0833   BUN 5 (L) 02/15/2020 1553   CREATININE 0.56 (L) 05/10/2020 0833   GLUCOSE 170 (H) 05/10/2020 0833   CALCIUM 9.2 05/10/2020 0833   AST 31 05/10/2020 0833   ALT 17 05/10/2020 0833   ALKPHOS 68 05/10/2020 0833   BILITOT 0.5 05/10/2020 0833   BILITOT <0.2 02/15/2020 1553   PROT 7.2 05/10/2020 2376  PROT 6.8 02/15/2020 1553   ALBUMIN 2.8 (L) 05/10/2020 0833   ALBUMIN 3.8 02/15/2020 1553    RADIOGRAPHIC STUDIES: CT Head Wo Contrast  Result Date: 05/07/2020 CLINICAL DATA:  Mental status change, unknown cause EXAM: CT HEAD WITHOUT CONTRAST TECHNIQUE: Contiguous axial images were obtained from the base of the skull through the vertex without intravenous contrast. COMPARISON:  None. FINDINGS: Brain: No acute infarct or intracranial hemorrhage. No mass lesion. No midline shift, ventriculomegaly or extra-axial fluid collection. Vascular: No hyperdense vessel or unexpected calcification. Skull: Negative for fracture or focal lesion. Sinuses/Orbits: Normal orbits. Clear paranasal sinuses. No mastoid effusion. Other: None. IMPRESSION: No acute intracranial process. Electronically Signed   By: Primitivo Gauze M.D.   On: 05/07/2020 07:09   DG ABDOMEN PEG TUBE LOCATION  Result Date: 04/28/2020 CLINICAL DATA:  Gastrostomy tube placement EXAM: ABDOMEN - 1 VIEW COMPARISON:  None. FINDINGS: 2 supine frontal views of the abdomen  and pelvis were obtained after the clinician instilled contrast via the indwelling percutaneous gastrostomy tube. Contrast is seen outlining the gastric rugal folds and proximal duodenum. Catheter is seen within the gastric lumen. No bowel obstruction or ileus. IMPRESSION: 1. Percutaneous gastrostomy tube within the gastric lumen. Electronically Signed   By: Randa Ngo M.D.   On: 04/28/2020 21:19   DG Chest Portable 1 View  Result Date: 05/07/2020 CLINICAL DATA:  AMS EXAM: PORTABLE CHEST 1 VIEW COMPARISON:  03/12/2020 and prior FINDINGS: Right chest wall Port-A-Cath tip overlies the cavoatrial junction. Cardiomediastinal silhouette within normal limits. No pneumothorax or pleural effusion. Hazy perihilar and bibasilar opacities are unchanged. No acute osseous abnormality. IMPRESSION: Hazy perihilar/bibasilar opacities, unchanged. Electronically Signed   By: Primitivo Gauze M.D.   On: 05/07/2020 07:04    PERFORMANCE STATUS (ECOG) : 2 - Symptomatic, <50% confined to bed  Review of Systems Unless otherwise noted, a complete review of systems is negative.  Physical Exam General: NAD Pulmonary: Unlabored Abdomen: PEG noted GU: no suprapubic tenderness Extremities: no edema, no joint deformities Skin: no rashes Neurological: Weakness but otherwise nonfocal  IMPRESSION: Routine follow-up visit.    Patient has presented to the ER several times recently for displaced PEG.  Patient tells me that he does not remember how the PEG was displaced.  I do note that it is hanging out of his shirt today and so could be at risk for pulling.  Reportedly, patient has had some intermittent confusion.  Work-up for same in the ER was essentially normal.  I do note the patient has 2 scopolamine patches on presently.  He does not report that they are helping managing his secretions.  Given risk of confusion, will discontinue scopolamine.  I called and spoke with patient's niece, with whom patient lives.  She  verbalizes frustration with his care at home.  She says that patient has been taking medications without knowing what or how much he is taking.  She says the patient has had difficulty sleeping at night and has been napping during the day and staying awake all night.  Family has been giving him melatonin elixir, which she asks that I prescribed today.  She says melatonin has been helping him sleep.  She asks about placement in a SNF.  I did ask patient about that and he would prefer to stay at home.  He says that he will go live with a cousin if he can no longer stay with his niece/nephew.    Patient does have Medicaid so he has several potential  options for care including SNF versus PACE versus PCS.  Will consult palliative care social worker to help coordinate resources at home.  PLAN: -Continue current scope of treatment -DC scopolamine -Continue morphine elixir as needed for pain -Melatonin elixir 2 to 5 mg nightly as needed for insomnia -ACP/MOST form previously reviewed -RTC 3 weeks  Case and plan discussed with Dr. Janese Banks   Patient expressed understanding and was in agreement with this plan. He also understands that He can call the clinic at any time with any questions, concerns, or complaints.     Time Total: 20 minutes  Visit consisted of counseling and education dealing with the complex and emotionally intense issues of symptom management and palliative care in the setting of serious and potentially life-threatening illness.Greater than 50%  of this time was spent counseling and coordinating care related to the above assessment and plan.  Signed by: Altha Harm, PhD, NP-C

## 2020-05-10 NOTE — Progress Notes (Signed)
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 Marius Ditch as an outpatient for symptoms of dysphagia. He underwent EGD on 03/02/2020 which showed a large fungating and ulcerating mass with bleeding and stigmata of recent bleeding in the distal esophagus 30 cm from the incisors. The degree of obstruction was severe enough that the scope could not be passed beyond the distal esophagus. Patient was supposed to see me today as an outpatient but then got admitted to the hospital with severe dysphagia to the point that he was not able to swallow anythingand had a PEG tube placement.  CT chest abdomen pelvis with contrast shows a large irregular ulcerated mass in the distal third of the esophagus with proximal esophagus being dilated and fluid-filled. Bilateral paratracheal and mediastinal adenopathy noted. Also found to have a 2.6 cm right supraclavicular lymph node no evidence of liver or other organ metastases.Supraclavicular lymph node was biopsied and was consistent with squamous cell carcinoma  Plan is for palliative FOLFOX Opdivo treatment along with palliative radiation to the lower esophageal mass. CPS 15.   Interval history- Patient states that he is able to drink juices sometimes which stay down without having to throw up. He is up to 5 cartons of tube feeds a day. He is  alone for his visit today but his niece reports that he has been having some altered mental status and even pulled out his G-tube. He did go to the ER who reposition the tube. His niece works and feels like he would be unsafe to be left alone  ECOG PS- 2 Pain scale- 2 Opioid associated constipation- no  Review of systems- Review of Systems  Constitutional: Positive for malaise/fatigue. Negative for chills, fever and weight loss.  HENT: Negative for congestion, ear discharge and nosebleeds.   Eyes: Negative for blurred vision.  Respiratory: Negative for cough, hemoptysis, sputum production, shortness of breath and wheezing.   Cardiovascular: Negative for chest pain, palpitations, orthopnea and claudication.  Gastrointestinal: Negative for abdominal pain, blood in stool, constipation, diarrhea, heartburn, melena, nausea and vomiting.  Genitourinary: Negative for dysuria, flank pain, frequency, hematuria and urgency.  Musculoskeletal: Negative for back pain, joint pain and myalgias.  Skin: Negative for rash.  Neurological: Negative for dizziness, tingling, focal weakness, seizures, weakness and headaches.  Endo/Heme/Allergies: Does not bruise/bleed easily.  Psychiatric/Behavioral: Negative for depression and suicidal ideas. The patient does not have insomnia.       No Known Allergies   Past Medical History:  Diagnosis Date  . esophageal cancer      Past Surgical History:  Procedure Laterality Date  . ESOPHAGOGASTRODUODENOSCOPY (EGD) WITH PROPOFOL N/A 03/02/2020   Procedure: ESOPHAGOGASTRODUODENOSCOPY (EGD) WITH PROPOFOL;  Surgeon: Lin Landsman, MD;  Location: [place];  Service: Gastroenterology;  Laterality: N/A;  . GASTROSTOMY TUBE PLACEMENT  03/08/2020  . MASS BIOPSY N/A 03/08/2020   Procedure: NECK MASS BIOPSY;  Surgeon: Jules Husbands, MD;  Location: [facility];  Service: General;  Laterality: N/A;  . PORTACATH PLACEMENT N/A 03/08/2020   Procedure: INSERTION  PORT-A-CATH;  Surgeon: Jules Husbands, MD;  Location: [facility];  Service: General;  Laterality: N/A;    Social History   Socioeconomic History  . Marital status: Single    Spouse name: Not on file  . Number of children: Not on file  . Years of education: Not on file  . Highest education level: Not on file  Occupational History  . Not on file  Tobacco Use  . Smoking status: Former Smoker    Packs/day: 0.50    Types: Cigarettes  . Smokeless tobacco: Never Used  . Tobacco comment: QUIT 3 WKS AGO  Vaping Use  . Vaping Use: Never used  Substance and Sexual Activity  . Alcohol use: Not Currently  . Drug use: Not Currently  . Sexual activity: Not Currently  Other Topics Concern  . Not on file  Social History Narrative  . Not on file   Social Determinants of Health   Financial Resource Strain: Not on file  Food Insecurity: Not on file  Transportation Needs: Not on file  Physical Activity: Not on file  Stress: Not on file  Social Connections: Not on file  Intimate Partner Violence: Not on file    Family History  Problem Relation Age of Onset  . Cancer Mother        lung     Current Outpatient Medications:  .  fentaNYL (DURAGESIC) 25 MCG/HR, Place 1 patch onto the skin every 3 (three) days. To help with pain, Disp: 5 patch, Rfl: 0 .  lidocaine-prilocaine (EMLA) cream, Apply to affected area once, Disp: 30 g, Rfl: 3 .  Morphine Sulfate (MORPHINE CONCENTRATE) 10 MG/0.5ML SOLN concentrated solution, Take 0.25 mLs (5 mg total) by mouth every 4 (four) hours as needed for severe pain., Disp: 30 mL, Rfl: 0 .  Nutritional Supplements (FEEDING SUPPLEMENT, OSMOLITE 1.5 CAL,) LIQD, Place 237 mLs into feeding tube 5 (five) times daily., Disp: , Rfl: 0 .  ondansetron (ZOFRAN ODT) 4 MG disintegrating tablet, Take 1 tablet (4 mg total) by mouth every 8 (eight) hours as needed for nausea or vomiting., Disp: 20 tablet, Rfl: 0 .  PROAIR HFA 108 (90 Base) MCG/ACT inhaler, Inhale into the  lungs., Disp: , Rfl:  .  acetaminophen (TYLENOL) 325 MG tablet, Place 2 tablets (650 mg total) into feeding tube every 6 (six) hours as needed for mild pain (or Fever >/= 101). (Patient not taking: Reported on 05/10/2020), Disp: , Rfl:  .  Melatonin 1 MG/ML LIQD, Take 2-5 mg by mouth at bedtime as needed., Disp: 50 mL, Rfl: 3 .  metoCLOPramide (REGLAN) 5 MG/5ML solution, Take 5 mLs (5 mg total) by mouth 4 (four) times daily -  before meals and at bedtime. (Patient not taking: Reported on 03/21/2020), Disp: 600 mL, Rfl: 0 .  nicotine (NICODERM CQ - DOSED IN MG/24 HOURS) 21 mg/24hr patch, One 21mg  patch chest wall daily (okay to substitute generic) (Patient not taking: Reported on 05/10/2020), Disp: 28 patch, Rfl: 0 .  Water For Irrigation, Sterile (FREE WATER) SOLN, Place 100 mLs into feeding tube 5 (five) times daily., Disp: 15000 mL, Rfl: 0 No current facility-administered medications for this visit.  Facility-Administered Medications Ordered in Other Visits:  .  sodium chloride flush (NS) 0.9 % injection 10 mL, 10 mL, Intravenous, PRN, Sindy Guadeloupe, MD, 10 mL at 05/10/20 6314  Physical exam:  Vitals:  05/10/20 0904  BP: 94/68  Pulse: 78  Resp: 16  Temp: 98.2 F (36.8 C)  TempSrc: Tympanic  SpO2: 99%  Weight: 86 lb 3.2 oz (39.1 kg)   Physical Exam Constitutional:      General: He is not in acute distress.    Comments: He is thin and frail. Sitting in a wheelchair  HENT:     Head: Normocephalic and atraumatic.  Eyes:     Extraocular Movements: EOM normal.     Pupils: Pupils are equal, round, and reactive to light.  Cardiovascular:     Rate and Rhythm: Regular rhythm. Tachycardia present.     Heart sounds: Normal heart sounds.  Pulmonary:     Effort: Pulmonary effort is normal.     Breath sounds: Normal breath sounds.  Abdominal:     General: Bowel sounds are normal.     Palpations: Abdomen is soft.     Comments: PEG tube in place  Musculoskeletal:     Cervical back:  Normal range of motion.  Lymphadenopathy:     Comments: Palpable solitary right supraclavicular lymph node  Skin:    General: Skin is warm and dry.  Neurological:     Mental Status: He is alert and oriented to person, place, and time.      CMP Latest Ref Rng & Units 05/10/2020  Glucose 70 - 99 mg/dL 170(H)  BUN 8 - 23 mg/dL 18  Creatinine 0.61 - 1.24 mg/dL 0.56(L)  Sodium 135 - 145 mmol/L 134(L)  Potassium 3.5 - 5.1 mmol/L 3.7  Chloride 98 - 111 mmol/L 97(L)  CO2 22 - 32 mmol/L 25  Calcium 8.9 - 10.3 mg/dL 9.2  Total Protein 6.5 - 8.1 g/dL 7.2  Total Bilirubin 0.3 - 1.2 mg/dL 0.5  Alkaline Phos 38 - 126 U/L 68  AST 15 - 41 U/L 31  ALT 0 - 44 U/L 17   CBC Latest Ref Rng & Units 05/10/2020  WBC 4.0 - 10.5 K/uL 1.6(L)  Hemoglobin 13.0 - 17.0 g/dL 10.5(L)  Hematocrit 39.0 - 52.0 % 32.5(L)  Platelets 150 - 400 K/uL 200    No images are attached to the encounter.  CT Head Wo Contrast  Result Date: 05/07/2020 CLINICAL DATA:  Mental status change, unknown cause EXAM: CT HEAD WITHOUT CONTRAST TECHNIQUE: Contiguous axial images were obtained from the base of the skull through the vertex without intravenous contrast. COMPARISON:  None. FINDINGS: Brain: No acute infarct or intracranial hemorrhage. No mass lesion. No midline shift, ventriculomegaly or extra-axial fluid collection. Vascular: No hyperdense vessel or unexpected calcification. Skull: Negative for fracture or focal lesion. Sinuses/Orbits: Normal orbits. Clear paranasal sinuses. No mastoid effusion. Other: None. IMPRESSION: No acute intracranial process. Electronically Signed   By: Primitivo Gauze M.D.   On: 05/07/2020 07:09   DG ABDOMEN PEG TUBE LOCATION  Result Date: 04/28/2020 CLINICAL DATA:  Gastrostomy tube placement EXAM: ABDOMEN - 1 VIEW COMPARISON:  None. FINDINGS: 2 supine frontal views of the abdomen and pelvis were obtained after the clinician instilled contrast via the indwelling percutaneous gastrostomy tube.  Contrast is seen outlining the gastric rugal folds and proximal duodenum. Catheter is seen within the gastric lumen. No bowel obstruction or ileus. IMPRESSION: 1. Percutaneous gastrostomy tube within the gastric lumen. Electronically Signed   By: Randa Ngo M.D.   On: 04/28/2020 21:19   DG Chest Portable 1 View  Result Date: 05/07/2020 CLINICAL DATA:  AMS EXAM: PORTABLE CHEST 1 VIEW COMPARISON:  03/12/2020 and prior FINDINGS: Right  chest wall Port-A-Cath tip overlies the cavoatrial junction. Cardiomediastinal silhouette within normal limits. No pneumothorax or pleural effusion. Hazy perihilar and bibasilar opacities are unchanged. No acute osseous abnormality. IMPRESSION: Hazy perihilar/bibasilar opacities, unchanged. Electronically Signed   By: Primitivo Gauze M.D.   On: 05/07/2020 07:04     Assessment and plan- Patient is a 64 y.o. male squamous cell carcinoma of the lower third of the esophagus with supraclavicular lymph node metastases.   He is here for on treatment assessment prior to cycle 4 of palliative FOLFOX chemotherapy  Patient's white count is down to 1.6 today with an ANC of 0.8. I will therefore hold off on giving him chemotherapy today. He finishes palliative radiation to his lower esophagus on 05/16/2020. I will give him a break over the holidays and see him back in 2 weeks time with CBC with differential and CMP for cycle 4 of FOLFOX Opdivo chemotherapy. Plan is to repeat scans after 5-6 cycles. I will plan to add Udenyca with day 3 of pump disconnect as well.  Protein energy malnutrition: Patient has a PEG tube in place and is up to taking 5 cartons a day. He is also following up with nutrition  Disposition: Patient's niece is finding it hard to take care of him and is concerned about his safety and leaving him alone 24 hours. NP Altha Harm from palliative care is also following the patient and is working on getting home palliative care services and see if outpatient  transition to assisted living is a possibility. However patient does not wish to do any outside facility and wishes to live with his relatives but it is unclear if his relatives would be willing to care for him.  Altered mental status: At the time of my visit today patient was coherent and not confused. I however asked him to stop the scopolamine patch as it is not really helping him with his secretions. Hopefully is secretions will improve after his esophageal obstruction gets better with palliative radiation.   Visit Diagnosis 1. Chemotherapy induced neutropenia (HCC)   2. Primary cancer of esophagus with metastasis to other site [facility])      Dr. Randa Evens, MD, MPH [facility] [phone] 05/10/2020 11:11 AM

## 2020-05-10 NOTE — Progress Notes (Signed)
PSN left a message with niece to call back to discuss Nursing home placement process.  PSN attempted to call niece's husband, but he did not answer, and his voice mail was full and not accepting messages.

## 2020-05-10 NOTE — Telephone Encounter (Signed)
Spoke with patient and have scheduled an In-person Consult for 05/11/20 @ 3 PM.   COVID screening was negative. One dog in home. Will put dog away before NP arrives.  Patient lives with nephew and nephew's wife.  Consent obtained; updated Outlook/Netsmart/Team List and Epic

## 2020-05-11 ENCOUNTER — Inpatient Hospital Stay: Payer: Medicaid Other

## 2020-05-11 ENCOUNTER — Other Ambulatory Visit: Payer: Self-pay | Admitting: *Deleted

## 2020-05-11 ENCOUNTER — Ambulatory Visit
Admission: RE | Admit: 2020-05-11 | Discharge: 2020-05-11 | Disposition: A | Discharge status: Home / Self Care | Payer: Medicaid Other | Source: Ambulatory Visit | Attending: Radiation Oncology | Admitting: Radiation Oncology

## 2020-05-11 ENCOUNTER — Other Ambulatory Visit: Payer: Medicaid Other | Admitting: Hospice

## 2020-05-11 ENCOUNTER — Other Ambulatory Visit: Payer: Self-pay

## 2020-05-11 ENCOUNTER — Other Ambulatory Visit: Payer: Self-pay | Admitting: Licensed Clinical Social Worker

## 2020-05-11 DIAGNOSIS — C159 Malignant neoplasm of esophagus, unspecified: Secondary | ICD-10-CM

## 2020-05-11 DIAGNOSIS — Z510 Encounter for antineoplastic radiation therapy: Secondary | ICD-10-CM | POA: Diagnosis not present

## 2020-05-11 DIAGNOSIS — Z515 Encounter for palliative care: Secondary | ICD-10-CM

## 2020-05-11 NOTE — Progress Notes (Signed)
PATIENT NAME: Andrew Pratt [address] [place] [address] [phone] (home)  DOB: 09/12/1955 MRN: [id number]  PRIMARY CARE PROVIDER:    Sindy Guadeloupe, MD,  [address] [place] [address] [phone]  REFERRING PROVIDER:   Sindy Guadeloupe, MD [place],  [place] [address] [phone]  RESPONSIBLE PARTY:   Extended Emergency Contact Information Primary Emergency Contact: [place] Mobile Phone: [phone] Relation: Sister Secondary Emergency Contact: STEPHENS,DEREK Address: [address]          [place], [place] [address] Johnnette Litter of [place] Phone: [phone] Mobile Phone: [phone] Relation: Nephew  I met face to face with patient and family in home/facility.  ADVANCE CARE PLANNING/RECOMMENDATIONS/PLAN:    Visit at the request of Altha Harm, NP  for palliative consult. Visit consisted of building trust and discussions on Palliative Medicine as specialized medical care for people living with serious illness, aimed at facilitating better quality of life through symptoms relief, assisting with advance care plan and establishing complex decision making.   Discussion on the difference between Palliative and Hospice care. Palliative care and hospice have similar goals of managing symptoms, promoting comfort, improving quality of life, and maintaining a person's dignity. However, palliative care may be offered during any phase of a serious illness, while hospice care is usually offered when a person is expected to live for 6 months or less.  Visit consisted of counseling and education dealing with the complex and emotionally intense issues of symptom management and palliative care in the setting of serious and potentially life-threatening illness.   Andrew Pratt shared that family is considering long-term care placement of patient to ensure quality care.  Discussed with him for [facility] social worker to visit for guidance and  resources as needed.  He declined and said not necessary at this time; affirmed that patient has Medicaid.  Palliative care team will continue to support patient, patient's family, and medical team.  Advance Care Planning: Our advance care planning conversation included a discussion about:    The value and importance of advance care planning  Exploration of goals of care in the event of a sudden injury or illness  Identification and preparation of a healthcare agent          Review and updating or creation of an advance directive document         CODE STATUS: Patient is currently a full code.  Andrew Pratt said he will further discuss with patient and next visit, his CODE STATUS may remain the same or change.  GOALS OF CARE: Goals of care include to maximize quality of life and symptom management.  Follow up Palliative Care Visit: Palliative care will continue to follow for goals of care clarification and symptom management.  Follow-up in a month 06/15/2020.  Discussed dates with Andrew Pratt and he said he will be present.  Symptom Management:  Patient is receiving chemotherapy and radiation for metastatic esophageal cancer.  Patient is nothing by mouth; gets Osmolite via Peg tube, noable to swallow saliva, spitting into a cup.  He denied nausea/vomiting. He has Ondansteron prn.  Pain: Pain in low back managed with Fentanyl patch, Morphine Concentrate.  He reported he was told at the cancer center today that he had an infection.  Andrew Pratt affirmed this and said he will go to pharmacy to pick up antibiotic. Patient is fairly independent in ADLs. Discussion on balance of rest and activity performance.  Palliative will continue to monitor for symptom management/decline and  make recommendations as needed.  Family /Caregiver/Community Supports: Patient lives at home with his nephew Andrew Pratt and nephew's wife Varney Biles.  They are involved in his care.  Strong family support system identified.  I spent 1 hour and 25  minutes providing this initial consultation; time includes time spent with patient/family, chart review, provider coordination,  and documentation. More than 50% of the time in this consultation was spent on counseling patient and coordinating communication.   CHIEF COMPLAIN/HISTORY OF PRESENT ILLNESS:  Andrew Pratt is a 64 y.o. male with multiple medical problems including metastatic esophageal.  History obtained from review of EMR, discussion patient and his nephew Andrew Pratt.   Palliative Care was asked to follow this patient by consultation request of Altha Harm NP to help address advance care planning and complex decision making. Thank you for the opportunity to participate in the care of [place]: Full  PPS: 50%  HOSPICE ELIGIBILITY/DIAGNOSIS: TBD  PAST MEDICAL HISTORY:  Past Medical History:  Diagnosis Date  . esophageal cancer     SOCIAL HX:  Social History   Tobacco Use  . Smoking status: Former Smoker    Packs/day: 0.50    Types: Cigarettes  . Smokeless tobacco: Never Used  . Tobacco comment: QUIT 3 WKS AGO  Substance Use Topics  . Alcohol use: Not Currently   FAMILY HX:  Family History  Problem Relation Age of Onset  . Cancer Mother        lung    ALLERGIES: No Known Allergies   PERTINENT MEDICATIONS:  Outpatient Encounter Medications as of 05/11/2020  Medication Sig  . acetaminophen (TYLENOL) 325 MG tablet Place 2 tablets (650 mg total) into feeding tube every 6 (six) hours as needed for mild pain (or Fever >/= 101). (Patient not taking: Reported on 05/10/2020)  . azithromycin (ZITHROMAX) 250 MG tablet Take 2 tablets day 1, then 1 tablet each day (crush the pills mix with water and put down g tube and flush before and after  . fentaNYL (DURAGESIC) 25 MCG/HR Place 1 patch onto the skin every 3 (three) days. To help with pain  . lidocaine-prilocaine (EMLA) cream Apply to affected area once  . Melatonin 1 MG/ML LIQD Take 2-5 mg by mouth at  bedtime as needed.  . metoCLOPramide (REGLAN) 5 MG/5ML solution Take 5 mLs (5 mg total) by mouth 4 (four) times daily -  before meals and at bedtime. (Patient not taking: Reported on 03/21/2020)  . Morphine Sulfate (MORPHINE CONCENTRATE) 10 MG/0.5ML SOLN concentrated solution Take 0.25 mLs (5 mg total) by mouth every 4 (four) hours as needed for severe pain.  . nicotine (NICODERM CQ - DOSED IN MG/24 HOURS) 21 mg/24hr patch One $RemoveB'21mg'DzeVaQks$  patch chest wall daily (okay to substitute generic) (Patient not taking: Reported on 05/10/2020)  . Nutritional Supplements (FEEDING SUPPLEMENT, OSMOLITE 1.5 CAL,) LIQD Place 237 mLs into feeding tube 5 (five) times daily.  . ondansetron (ZOFRAN ODT) 4 MG disintegrating tablet Take 1 tablet (4 mg total) by mouth every 8 (eight) hours as needed for nausea or vomiting.  Marland Kitchen PROAIR HFA 108 (90 Base) MCG/ACT inhaler Inhale into the lungs.  . Water For Irrigation, Sterile (FREE WATER) SOLN Place 100 mLs into feeding tube 5 (five) times daily.   Facility-Administered Encounter Medications as of 05/11/2020  Medication  . sodium chloride flush (NS) 0.9 % injection 10 mL    PHYSICAL EXAM/ROS:  General: NAD, cooperative Cardiovascular: regular rate and rhythm; denies chest pain  Pulmonary: clear ant /post fields Abdomen: soft, nontender, + bowel sounds GU: no suprapubic tenderness Extremities: no edema, no joint deformities Skin: no rashes to visible skin Neurological: Weakness but otherwise nonfocal  Note: Portions of this note were generated with Lobbyist. Dictation errors may occur despite best attempts at proofreading.  Teodoro Spray, NP

## 2020-05-12 ENCOUNTER — Ambulatory Visit: Payer: Medicaid Other

## 2020-05-12 ENCOUNTER — Inpatient Hospital Stay: Payer: Medicaid Other

## 2020-05-12 NOTE — Progress Notes (Signed)
Nutrition Follow-up:  Patient with stage IV esophageal cancer.  Patient receiving chemotherapy and radiation therapy.    Spoke with patient via phone.  Patient reports that he has been giving 5 cartons of tube feeding daily. One carton at 6am, 11 am, 2pm, 6pm and 8-9pm.  He says that he has not missed any doses of tube feeding and tolerating well.  Says he flushes tube with water before and after (syringe full).  Sometimes he gives water in between feedings but can't tell RD how much or when, "when I feel like I need it."  Denies stomach upset or problems tolerating feeding. Patient coughing and spiting during phone visit.   Noted was in ED with tube coming out.  Does not understand why he can't come to radiation.  RD asked permission from patient to call niece Ellison Hughs and patient agreed.  Niece reports that patient has been giving 5 cartons of tube feeding daily and does not think he has missed any doses.    RD noted issues with confusion recently.     Medications: reviewed  Labs: reviewed  Anthropometrics:   Weight 85 lb 1 oz on 12/15 in [facility]  86 lb on 12/14 (medical oncology) 87 lb on 11/11  Unsure why weight is decreasing with amount of calories and protein being provided with tube feeding.   Estimated Energy Needs  Kcals: 1500-1700 Protein: 75-85 g Fluid: > 1.5 L  NUTRITION DIAGNOSIS: Inadequate oral intake continues relying on tube feeding   INTERVENTION:  Spoke to patient and niece about increasing tube feeding from 5 cartons daily to 6 cartons daily.  Discussed with patient to give 1 carton q 6 hours starting at 6am.  Patient verbalized the regimen back to RD.  Flush with 2ml of water before and after feeding.  Tube feeding will provide 2130 calories, 89 g protein and 1600 ml free water. Niece providing water with medications. Will provide patient with sample cases of osmolite and if able to tolerate 6 cartons will need to send new order to [place], verbalized this  to patient and niece.  Patient will pick up cases of tube feeding on Monday, 12/20.   Niece did not understand why patient could not get radiation today and tomorrow.  Will ask RN from radiation to call niece.    MONITORING, EVALUATION, GOAL: weight trends, tube feeding   NEXT VISIT: Dec 30 f/u at pump removal  Citlally Captain B. Zenia Resides, [place], [place] Registered Dietitian [phone] (mobile)

## 2020-05-13 ENCOUNTER — Ambulatory Visit: Payer: Medicaid Other

## 2020-05-13 ENCOUNTER — Inpatient Hospital Stay: Payer: Medicaid Other

## 2020-05-16 ENCOUNTER — Other Ambulatory Visit: Payer: Self-pay

## 2020-05-16 ENCOUNTER — Inpatient Hospital Stay: Payer: Medicaid Other

## 2020-05-16 ENCOUNTER — Ambulatory Visit
Admission: RE | Admit: 2020-05-16 | Discharge: 2020-05-16 | Disposition: A | Discharge status: Home / Self Care | Payer: Medicaid Other | Source: Ambulatory Visit | Attending: Radiation Oncology | Admitting: Radiation Oncology

## 2020-05-16 DIAGNOSIS — Z510 Encounter for antineoplastic radiation therapy: Secondary | ICD-10-CM | POA: Diagnosis not present

## 2020-05-17 ENCOUNTER — Ambulatory Visit
Admission: RE | Admit: 2020-05-17 | Discharge: 2020-05-17 | Disposition: A | Discharge status: Home / Self Care | Payer: Medicaid Other | Source: Ambulatory Visit | Attending: Radiation Oncology | Admitting: Radiation Oncology

## 2020-05-17 ENCOUNTER — Inpatient Hospital Stay: Payer: Medicaid Other

## 2020-05-17 DIAGNOSIS — Z510 Encounter for antineoplastic radiation therapy: Secondary | ICD-10-CM | POA: Diagnosis not present

## 2020-05-18 ENCOUNTER — Ambulatory Visit: Payer: Medicaid Other

## 2020-05-18 ENCOUNTER — Ambulatory Visit: Admission: RE | Admit: 2020-05-18 | Payer: Medicaid Other | Source: Ambulatory Visit

## 2020-05-18 ENCOUNTER — Other Ambulatory Visit: Payer: Self-pay | Admitting: *Deleted

## 2020-05-19 ENCOUNTER — Ambulatory Visit
Admission: RE | Admit: 2020-05-19 | Discharge: 2020-05-19 | Disposition: A | Discharge status: Home / Self Care | Payer: Medicaid Other | Source: Ambulatory Visit | Attending: Radiation Oncology | Admitting: Radiation Oncology

## 2020-05-19 ENCOUNTER — Inpatient Hospital Stay: Payer: Medicaid Other

## 2020-05-19 ENCOUNTER — Ambulatory Visit: Payer: Medicaid Other

## 2020-05-19 ENCOUNTER — Other Ambulatory Visit: Payer: Self-pay

## 2020-05-19 DIAGNOSIS — Z510 Encounter for antineoplastic radiation therapy: Secondary | ICD-10-CM | POA: Diagnosis not present

## 2020-05-24 ENCOUNTER — Inpatient Hospital Stay: Payer: Medicaid Other

## 2020-05-24 ENCOUNTER — Inpatient Hospital Stay (HOSPITAL_BASED_OUTPATIENT_CLINIC_OR_DEPARTMENT_OTHER): Payer: Medicaid Other | Admitting: Oncology

## 2020-05-24 ENCOUNTER — Inpatient Hospital Stay (HOSPITAL_BASED_OUTPATIENT_CLINIC_OR_DEPARTMENT_OTHER): Payer: Medicaid Other | Admitting: Hospice and Palliative Medicine

## 2020-05-24 ENCOUNTER — Encounter: Payer: Self-pay | Admitting: Oncology

## 2020-05-24 VITALS — BP 106/78 | HR 126 | Temp 98.3°F | Resp 18 | Wt 86.9 lb

## 2020-05-24 DIAGNOSIS — Z5111 Encounter for antineoplastic chemotherapy: Secondary | ICD-10-CM | POA: Diagnosis not present

## 2020-05-24 DIAGNOSIS — C159 Malignant neoplasm of esophagus, unspecified: Secondary | ICD-10-CM

## 2020-05-24 DIAGNOSIS — Z5112 Encounter for antineoplastic immunotherapy: Secondary | ICD-10-CM | POA: Diagnosis not present

## 2020-05-24 DIAGNOSIS — M25511 Pain in right shoulder: Secondary | ICD-10-CM | POA: Diagnosis not present

## 2020-05-24 DIAGNOSIS — Z515 Encounter for palliative care: Secondary | ICD-10-CM

## 2020-05-24 DIAGNOSIS — G893 Neoplasm related pain (acute) (chronic): Secondary | ICD-10-CM | POA: Diagnosis not present

## 2020-05-24 LAB — COMPREHENSIVE METABOLIC PANEL
ALT: 21 U/L (ref 0–44)
AST: 29 U/L (ref 15–41)
Albumin: 3 g/dL — ABNORMAL LOW (ref 3.5–5.0)
Alkaline Phosphatase: 118 U/L (ref 38–126)
Anion gap: 9 (ref 5–15)
BUN: 21 mg/dL (ref 8–23)
CO2: 27 mmol/L (ref 22–32)
Calcium: 9.5 mg/dL (ref 8.9–10.3)
Chloride: 95 mmol/L — ABNORMAL LOW (ref 98–111)
Creatinine, Ser: 0.42 mg/dL — ABNORMAL LOW (ref 0.61–1.24)
GFR, Estimated: >60 mL/min (ref >60)
Glucose, Bld: 165 mg/dL — ABNORMAL HIGH (ref 70–99)
Potassium: 3.9 mmol/L (ref 3.5–5.1)
Sodium: 131 mmol/L — ABNORMAL LOW (ref 135–145)
Total Bilirubin: 0.6 mg/dL (ref 0.3–1.2)
Total Protein: 8 g/dL (ref 6.5–8.1)

## 2020-05-24 LAB — CBC WITH DIFFERENTIAL/PLATELET
Abs Immature Granulocytes: 0.42 10*3/uL — ABNORMAL HIGH (ref 0.00–0.07)
Basophils Absolute: 0.1 10*3/uL (ref 0.0–0.1)
Basophils Relative: 0 %
Eosinophils Absolute: 0 10*3/uL (ref 0.0–0.5)
Eosinophils Relative: 0 %
HCT: 35.1 % — ABNORMAL LOW (ref 39.0–52.0)
Hemoglobin: 11.2 g/dL — ABNORMAL LOW (ref 13.0–17.0)
Immature Granulocytes: 3 %
Lymphocytes Relative: 2 %
Lymphs Abs: 0.4 10*3/uL — ABNORMAL LOW (ref 0.7–4.0)
MCH: 28 pg (ref 26.0–34.0)
MCHC: 31.9 g/dL (ref 30.0–36.0)
MCV: 87.8 fL (ref 80.0–100.0)
Monocytes Absolute: 2 10*3/uL — ABNORMAL HIGH (ref 0.1–1.0)
Monocytes Relative: 12 %
Neutro Abs: 13.7 10*3/uL — ABNORMAL HIGH (ref 1.7–7.7)
Neutrophils Relative %: 83 %
Platelets: 426 10*3/uL — ABNORMAL HIGH (ref 150–400)
RBC: 4 MIL/uL — ABNORMAL LOW (ref 4.22–5.81)
RDW: 20.5 % — ABNORMAL HIGH (ref 11.5–15.5)
WBC: 16.6 10*3/uL — ABNORMAL HIGH (ref 4.0–10.5)
nRBC: 0 % (ref 0.0–0.2)

## 2020-05-24 MED ORDER — SODIUM CHLORIDE 0.9 % IV SOLN
10.0000 mg | Freq: Once | INTRAVENOUS | Status: AC
  Administered 2020-05-24: 11:00:00 10 mg via INTRAVENOUS
  Filled 2020-05-24: qty 10

## 2020-05-24 MED ORDER — SODIUM CHLORIDE 0.9 % IV SOLN
2400.0000 mg/m2 | INTRAVENOUS | Status: DC
  Administered 2020-05-24: 14:00:00 3300 mg via INTRAVENOUS
  Filled 2020-05-24: qty 66

## 2020-05-24 MED ORDER — DEXTROSE 5 % IV SOLN
Freq: Once | INTRAVENOUS | Status: AC
  Filled 2020-05-24: qty 250

## 2020-05-24 MED ORDER — SODIUM CHLORIDE 0.9 % IV SOLN
480.0000 mg | Freq: Once | INTRAVENOUS | Status: AC
  Administered 2020-05-24: 480 mg via INTRAVENOUS
  Filled 2020-05-24: qty 48

## 2020-05-24 MED ORDER — PALONOSETRON HCL INJECTION 0.25 MG/5ML
0.2500 mg | Freq: Once | INTRAVENOUS | Status: AC
  Administered 2020-05-24: 11:00:00 0.25 mg via INTRAVENOUS
  Filled 2020-05-24: qty 5

## 2020-05-24 MED ORDER — FLUOROURACIL CHEMO INJECTION 2.5 GM/50ML
400.0000 mg/m2 | Freq: Once | INTRAVENOUS | Status: AC
  Administered 2020-05-24: 14:00:00 550 mg via INTRAVENOUS
  Filled 2020-05-24: qty 11

## 2020-05-24 MED ORDER — OXALIPLATIN CHEMO INJECTION 100 MG/20ML
65.0000 mg/m2 | Freq: Once | INTRAVENOUS | Status: AC
  Administered 2020-05-24: 12:00:00 90 mg via INTRAVENOUS
  Filled 2020-05-24: qty 18

## 2020-05-24 MED ORDER — SODIUM CHLORIDE 0.9% FLUSH
10.0000 mL | Freq: Once | INTRAVENOUS | Status: AC
  Administered 2020-05-24: 09:00:00 10 mL via INTRAVENOUS
  Filled 2020-05-24: qty 10

## 2020-05-24 MED ORDER — HEPARIN SOD (PORK) LOCK FLUSH 100 UNIT/ML IV SOLN
500.0000 [IU] | Freq: Once | INTRAVENOUS | Status: DC
  Filled 2020-05-24: qty 5

## 2020-05-24 MED ORDER — DEXTROSE 5 % IV SOLN
550.0000 mg | Freq: Once | INTRAVENOUS | Status: AC
Start: 2020-05-24 — End: 2020-05-24
  Administered 2020-05-24: 12:00:00 550 mg via INTRAVENOUS
  Filled 2020-05-24: qty 27.5

## 2020-05-24 MED ORDER — MORPHINE SULFATE (CONCENTRATE) 10 MG/0.5ML PO SOLN: 5.0000 mg | ORAL | 0 refills | Status: DC | PRN

## 2020-05-24 NOTE — Progress Notes (Signed)
**Note Andrew-Identified via Obfuscation** Palliative Medicine [facility]  [phone] [phone]   Name: Andrew Pratt Date: 05/24/2020 MRN: [id number]  DOB: [birth year]  Patient Care Team: Creig Hines, MD as PCP - General (Oncology) Benita Gutter, RN as Oncology Nurse Navigator Creig Hines, MD as Consulting Physician (Hematology and Oncology)    REASON FOR CONSULTATION: Andrew Pratt is a 64 y.o. 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  Patient is status post PEG tube placement.  He has had significant symptom burden with dysphagia and difficulty managing oral secretions.  He is also have pain and constipation.  Palliative care was consulted help address goals and manage ongoing symptoms.  SOCIAL HISTORY:     reports that he has quit smoking. His smoking use included cigarettes. He smoked 0.50 packs per day. He has never used smokeless tobacco. He reports previous alcohol use. He reports previous drug use.   Patient was never married has no children.  He lives with a nephew and nephew's wife.  He has 12 brothers and sisters.  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  ADVANCE DIRECTIVES:  Does not have  CODE STATUS:   PAST MEDICAL HISTORY: Past Medical History:  Diagnosis Date  . esophageal cancer     PAST SURGICAL HISTORY:  Past Surgical History:  Procedure Laterality Date  . ESOPHAGOGASTRODUODENOSCOPY (EGD) WITH PROPOFOL N/A 03/02/2020   Procedure: ESOPHAGOGASTRODUODENOSCOPY (EGD) WITH PROPOFOL;  Surgeon: Toney Reil, MD;  Location: [facility] ENDOSCOPY;  Service: Gastroenterology;  Laterality: N/A;  . GASTROSTOMY TUBE PLACEMENT  03/08/2020  . MASS BIOPSY N/A 03/08/2020   Procedure: NECK MASS BIOPSY;  Surgeon: Leafy Ro, MD;  Location: [facility] ORS;  Service: General;  Laterality: N/A;   . PORTACATH PLACEMENT N/A 03/08/2020   Procedure: INSERTION PORT-A-CATH;  Surgeon: Leafy Ro, MD;  Location: [facility] ORS;  Service: General;  Laterality: N/A;    HEMATOLOGY/ONCOLOGY HISTORY:  Oncology History  Primary cancer of esophagus with metastasis to other site (HCC)  03/07/2020 Initial Diagnosis   Primary cancer of esophagus with metastasis to other site [facility])   03/22/2020 -  Chemotherapy   The patient had dexamethasone (DECADRON) 4 MG tablet, 8 mg, Oral, Daily, 1 of 1 cycle, Start date: --, End date: -- palonosetron (ALOXI) injection 0.25 mg, 0.25 mg, Intravenous,  Once, 3 of 7 cycles Administration: 0.25 mg (03/22/2020), 0.25 mg (04/05/2020), 0.25 mg (04/26/2020) pegfilgrastim-cbqv (UDENYCA) injection 6 mg, 6 mg, Subcutaneous, Once, 1 of 5 cycles leucovorin 550 mg in dextrose 5 % 250 mL infusion, 548 mg, Intravenous,  Once, 3 of 7 cycles Administration: 550 mg (03/22/2020) oxaliplatin (ELOXATIN) 115 mg in dextrose 5 % 500 mL chemo infusion, 85 mg/m2 = 115 mg, Intravenous,  Once, 3 of 7 cycles Dose modification: 65 mg/m2 (original dose 85 mg/m2, Cycle 4, Reason: Other (see comments)), 65 mg/m2 (original dose 85 mg/m2, Cycle 3, Reason: Other (see comments)) Administration: 115 mg (03/22/2020), 115 mg (04/05/2020), 115 mg (04/26/2020) fluorouracil (ADRUCIL) chemo injection 550 mg, 400 mg/m2 = 550 mg, Intravenous,  Once, 3 of 7 cycles Administration: 550 mg (03/22/2020), 550 mg (04/05/2020), 550 mg (04/26/2020) fluorouracil (ADRUCIL) 3,300 mg in sodium chloride 0.9 % 84 mL chemo infusion, 2,400 mg/m2 = 3,300 mg, Intravenous, 1 Day/Dose, 3 of 7 cycles Administration: 3,300 mg (03/22/2020), 3,300 mg (04/05/2020), 3,300 mg (04/26/2020) nivolumab (OPDIVO) 480  mg in sodium chloride 0.9 % 100 mL chemo infusion, 480 mg, Intravenous, Once, 3 of 7 cycles Administration: 480 mg (04/26/2020)  for chemotherapy treatment.      ALLERGIES:  has No Known Allergies.  MEDICATIONS:  Current  Outpatient Medications  Medication Sig Dispense Refill  . acetaminophen (TYLENOL) 325 MG tablet Place 2 tablets (650 mg total) into feeding tube every 6 (six) hours as needed for mild pain (or Fever >/= 101).    Marland Kitchen azithromycin (ZITHROMAX) 250 MG tablet Take 2 tablets day 1, then 1 tablet each day (crush the pills mix with water and put down g tube and flush before and after (Patient not taking: Reported on 05/24/2020) 6 each 0  . fentaNYL (DURAGESIC) 25 MCG/HR Place 1 patch onto the skin every 3 (three) days. To help with pain (Patient not taking: Reported on 05/24/2020) 5 patch 0  . lidocaine-prilocaine (EMLA) cream Apply to affected area once (Patient not taking: Reported on 05/24/2020) 30 g 3  . Melatonin 1 MG/ML LIQD Take 2-5 mg by mouth at bedtime as needed. 50 mL 3  . metoCLOPramide (REGLAN) 5 MG/5ML solution Take 5 mLs (5 mg total) by mouth 4 (four) times daily -  before meals and at bedtime. (Patient not taking: Reported on 03/21/2020) 600 mL 0  . Morphine Sulfate (MORPHINE CONCENTRATE) 10 MG/0.5ML SOLN concentrated solution Take 0.25 mLs (5 mg total) by mouth every 4 (four) hours as needed for severe pain. 30 mL 0  . nicotine (NICODERM CQ - DOSED IN MG/24 HOURS) 21 mg/24hr patch One 21mg  patch chest wall daily (okay to substitute generic) 28 patch 0  . Nutritional Supplements (FEEDING SUPPLEMENT, OSMOLITE 1.5 CAL,) LIQD Place 237 mLs into feeding tube 5 (five) times daily.  0  . ondansetron (ZOFRAN ODT) 4 MG disintegrating tablet Take 1 tablet (4 mg total) by mouth every 8 (eight) hours as needed for nausea or vomiting. 20 tablet 0  . PROAIR HFA 108 (90 Base) MCG/ACT inhaler Inhale into the lungs.    . Water For Irrigation, Sterile (FREE WATER) SOLN Place 100 mLs into feeding tube 5 (five) times daily. 15000 mL 0   No current facility-administered medications for this visit.   Facility-Administered Medications Ordered in Other Visits  Medication Dose Route Frequency Provider Last Rate Last  Admin  . fluorouracil (ADRUCIL) 3,300 mg in sodium chloride 0.9 % 84 mL chemo infusion  2,400 mg/m2 (Treatment Plan Recorded) Intravenous 1 day or 1 dose Sindy Guadeloupe, MD      . fluorouracil (ADRUCIL) chemo injection 550 mg  400 mg/m2 (Treatment Plan Recorded) Intravenous Once Sindy Guadeloupe, MD      . heparin lock flush 100 unit/mL  500 Units Intravenous Once Sindy Guadeloupe, MD      . leucovorin 550 mg in dextrose 5 % 250 mL infusion  550 mg Intravenous Once Sindy Guadeloupe, MD 139 mL/hr at 05/24/20 1153 550 mg at 05/24/20 1153  . oxaliplatin (ELOXATIN) 90 mg in dextrose 5 % 500 mL chemo infusion  65 mg/m2 (Treatment Plan Recorded) Intravenous Once Sindy Guadeloupe, MD 259 mL/hr at 05/24/20 1152 90 mg at 05/24/20 1152  . sodium chloride flush (NS) 0.9 % injection 10 mL  10 mL Intravenous PRN Sindy Guadeloupe, MD   10 mL at 05/10/20 0932    VITAL SIGNS: There were no vitals taken for this visit. There were no vitals filed for this visit.  Estimated body mass index is 16.42 kg/m as  calculated from the following:   Height as of 05/06/20: 5\' 1"  (1.549 m).   Weight as of an earlier encounter on 05/24/20: 86 lb 14.4 oz (39.4 kg).  LABS: CBC:    Component Value Date/Time   WBC 16.6 (H) 05/24/2020 0855   HGB 11.2 (L) 05/24/2020 0855   HGB 10.6 (L) 02/15/2020 1553   HCT 35.1 (L) 05/24/2020 0855   HCT 33.5 (L) 02/15/2020 1553   PLT 426 (H) 05/24/2020 0855   PLT 611 (H) 02/15/2020 1553   MCV 87.8 05/24/2020 0855   MCV 78 (L) 02/15/2020 1553   NEUTROABS 13.7 (H) 05/24/2020 0855   LYMPHSABS 0.4 (L) 05/24/2020 0855   MONOABS 2.0 (H) 05/24/2020 0855   EOSABS 0.0 05/24/2020 0855   BASOSABS 0.1 05/24/2020 0855   Comprehensive Metabolic Panel:    Component Value Date/Time   NA 131 (L) 05/24/2020 0855   NA 140 02/15/2020 1553   K 3.9 05/24/2020 0855   CL 95 (L) 05/24/2020 0855   CO2 27 05/24/2020 0855   BUN 21 05/24/2020 0855   BUN 5 (L) 02/15/2020 1553   CREATININE 0.42 (L) 05/24/2020 0855    GLUCOSE 165 (H) 05/24/2020 0855   CALCIUM 9.5 05/24/2020 0855   AST 29 05/24/2020 0855   ALT 21 05/24/2020 0855   ALKPHOS 118 05/24/2020 0855   BILITOT 0.6 05/24/2020 0855   BILITOT <0.2 02/15/2020 1553   PROT 8.0 05/24/2020 0855   PROT 6.8 02/15/2020 1553   ALBUMIN 3.0 (L) 05/24/2020 0855   ALBUMIN 3.8 02/15/2020 1553    RADIOGRAPHIC STUDIES: CT Head Wo Contrast  Result Date: 05/07/2020 CLINICAL DATA:  Mental status change, unknown cause EXAM: CT HEAD WITHOUT CONTRAST TECHNIQUE: Contiguous axial images were obtained from the base of the skull through the vertex without intravenous contrast. COMPARISON:  None. FINDINGS: Brain: No acute infarct or intracranial hemorrhage. No mass lesion. No midline shift, ventriculomegaly or extra-axial fluid collection. Vascular: No hyperdense vessel or unexpected calcification. Skull: Negative for fracture or focal lesion. Sinuses/Orbits: Normal orbits. Clear paranasal sinuses. No mastoid effusion. Other: None. IMPRESSION: No acute intracranial process. Electronically Signed   By: 14/03/2020 M.D.   On: 05/07/2020 07:09   DG ABDOMEN PEG TUBE LOCATION  Result Date: 04/28/2020 CLINICAL DATA:  Gastrostomy tube placement EXAM: ABDOMEN - 1 VIEW COMPARISON:  None. FINDINGS: 2 supine frontal views of the abdomen and pelvis were obtained after the clinician instilled contrast via the indwelling percutaneous gastrostomy tube. Contrast is seen outlining the gastric rugal folds and proximal duodenum. Catheter is seen within the gastric lumen. No bowel obstruction or ileus. IMPRESSION: 1. Percutaneous gastrostomy tube within the gastric lumen. Electronically Signed   By: 14/06/2019 M.D.   On: 04/28/2020 21:19   DG Chest Portable 1 View  Result Date: 05/07/2020 CLINICAL DATA:  AMS EXAM: PORTABLE CHEST 1 VIEW COMPARISON:  03/12/2020 and prior FINDINGS: Right chest wall Port-A-Cath tip overlies the cavoatrial junction. Cardiomediastinal silhouette within  normal limits. No pneumothorax or pleural effusion. Hazy perihilar and bibasilar opacities are unchanged. No acute osseous abnormality. IMPRESSION: Hazy perihilar/bibasilar opacities, unchanged. Electronically Signed   By: 03/14/2020 M.D.   On: 05/07/2020 07:04    PERFORMANCE STATUS (ECOG) : 2 - Symptomatic, <50% confined to bed  Review of Systems Unless otherwise noted, a complete review of systems is negative.  Physical Exam General: NAD Pulmonary: Unlabored Abdomen: PEG noted GU: no suprapubic tenderness Extremities: no edema, no joint deformities Skin: no rashes Neurological: Weakness but  otherwise nonfocal  IMPRESSION: Routine follow-up visit.  Patient was seen in infusion.  Patient says that he is not been able to sleep well recently due to severe right shoulder pain.  Dr. Smith Robert is obtaining imaging.  Patient says that he is taking morphine elixir but that it is not currently helping at the 5 mg dose.  Discussed option of liberalizing his morphine elixir to 5 to 10 mg as needed.  He was previously prescribed transdermal fentanyl but is not currently using it.  Alternatively, Xtampza ER could be considered via PEG as an LAO.  Appreciate home-based palliative care support.  Family are overwhelmed with his care at home.  Would recommend social work follow-up to discuss placement.  PLAN: -Continue current scope of treatment -Increase morphine elixir 5 to 10 mg every 4 hours as needed for pain -ACP/MOST form previously reviewed -RTC 3 weeks  Case and plan discussed with Dr. Smith Robert   Patient expressed understanding and was in agreement with this plan. He also understands that He can call the clinic at any time with any questions, concerns, or complaints.     Time Total: 20 minutes  Visit consisted of counseling and education dealing with the complex and emotionally intense issues of symptom management and palliative care in the setting of serious and potentially  life-threatening illness.Greater than 50%  of this time was spent counseling and coordinating care related to the above assessment and plan.  Signed by: Laurette Schimke, PhD, NP-C

## 2020-05-24 NOTE — Progress Notes (Signed)
Per Dr. Smith Robert okay to proceed with Nivolumab and Folfox treatment with HR 126.  1420: Pt tolerated infusion well. No s/s of distress or reaction noted. Pt stable at discharge.

## 2020-05-24 NOTE — Progress Notes (Signed)
Hematology/Oncology Consult note 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 Marius Ditch as an outpatient for symptoms of dysphagia. He underwent EGD on 03/02/2020 which showed a large fungating and ulcerating mass with bleeding and stigmata of recent bleeding in the distal esophagus 30 cm from the incisors. The degree of obstruction was severe enough that the scope could not be passed beyond the distal esophagus. Patient was supposed to see me today as an outpatient but then got admitted to the hospital with severe dysphagia to the point that he was not able to swallow anythingand had a PEG tube placement.  CT chest abdomen pelvis with contrast shows a large irregular ulcerated mass in the distal third of the esophagus with proximal esophagus being dilated and fluid-filled. Bilateral paratracheal and mediastinal adenopathy noted. Also found to have a 2.6 cm right supraclavicular lymph node no evidence of liver or other organ metastases.Supraclavicular lymph node was biopsied and was consistent with squamous cell carcinoma  Plan is for palliative FOLFOX Opdivo treatment along with palliative radiation to the lower esophageal mass. CPS 15.   Interval history-patient reports acute right shoulder pain which has been worsening over the last 2 weeks to the point that he is unable to lift of his right arm.  He has  been using Tylenol for pain which has not been helping.  He is using 5-6 cartons of tube feeds a day.  He continues to spit up saliva on and off.  Denies any nausea or vomiting weight is low but has remained stable  ECOG PS- 2 Pain scale- 7   Review of systems- Review of Systems  Constitutional: Positive for malaise/fatigue. Negative for chills, fever and weight loss.  HENT: Negative for congestion, ear discharge and nosebleeds.   Eyes: Negative for blurred vision.  Respiratory: Negative for cough, hemoptysis, sputum production, shortness of breath and wheezing.   Cardiovascular: Negative for chest pain, palpitations, orthopnea and claudication.  Gastrointestinal: Negative for abdominal pain, blood in stool, constipation, diarrhea, heartburn, melena, nausea and vomiting.  Genitourinary: Negative for dysuria, flank pain, frequency, hematuria and urgency.  Musculoskeletal: Negative for back pain, joint pain and myalgias.       Right shoulder pain  Skin: Negative for rash.  Neurological: Negative for dizziness, tingling, focal weakness, seizures, weakness and headaches.  Endo/Heme/Allergies: Does not bruise/bleed easily.  Psychiatric/Behavioral: Negative for depression and suicidal ideas. The patient does not have insomnia.       No Known Allergies   Past Medical History:  Diagnosis Date  . esophageal cancer      Past Surgical History:  Procedure Laterality Date  . ESOPHAGOGASTRODUODENOSCOPY (EGD) WITH PROPOFOL N/A 03/02/2020   Procedure: ESOPHAGOGASTRODUODENOSCOPY (EGD) WITH PROPOFOL;  Surgeon: Lin Landsman, MD;  Location: [place];  Service: Gastroenterology;  Laterality: N/A;  . GASTROSTOMY TUBE PLACEMENT  03/08/2020  . MASS BIOPSY N/A 03/08/2020   Procedure: NECK MASS BIOPSY;  Surgeon: Jules Husbands, MD;  Location: [facility] ORS;  Service: General;  Laterality: N/A;  . PORTACATH PLACEMENT N/A 03/08/2020   Procedure: INSERTION PORT-A-CATH;  Surgeon: Jules Husbands, MD;   Location: [facility] ORS;  Service: General;  Laterality: N/A;    Social History   Socioeconomic History  . Marital status: Single    Spouse name: Not on file  . Number of children: Not on file  . Years of education: Not on file  . Highest education level: Not on file  Occupational History  . Not on file  Tobacco Use  . Smoking status: Former Smoker    Packs/day: 0.50    Types: Cigarettes  . Smokeless tobacco: Never Used  . Tobacco comment: QUIT 3 WKS AGO  Vaping Use  . Vaping Use: Never used  Substance and Sexual Activity  . Alcohol use: Not Currently  . Drug use: Not Currently  . Sexual activity: Not Currently  Other Topics Concern  . Not on file  Social History Narrative  . Not on file   Social Determinants of Health   Financial Resource Strain: Not on file  Food Insecurity: Not on file  Transportation Needs: Not on file  Physical Activity: Not on file  Stress: Not on file  Social Connections: Not on file  Intimate Partner Violence: Not on file    Family History  Problem Relation Age of Onset  . Cancer Mother        lung     Current Outpatient Medications:  .  acetaminophen (TYLENOL) 325 MG tablet, Place 2 tablets (650 mg total) into feeding tube every 6 (six) hours as needed for mild pain (or Fever >/= 101). (Patient not taking: Reported on 05/10/2020), Disp: , Rfl:  .  azithromycin (ZITHROMAX) 250 MG tablet, Take 2 tablets day 1, then 1 tablet each day (crush the pills mix with water and put down g tube and flush before and after, Disp: 6 each, Rfl: 0 .  fentaNYL (DURAGESIC) 25 MCG/HR, Place 1 patch onto the skin every 3 (three) days. To help with pain, Disp: 5 patch, Rfl: 0 .  lidocaine-prilocaine (EMLA) cream, Apply to affected area once, Disp: 30 g, Rfl: 3 .  Melatonin 1 MG/ML LIQD, Take 2-5 mg by mouth at bedtime as needed., Disp: 50 mL, Rfl: 3 .  metoCLOPramide (REGLAN) 5 MG/5ML solution, Take 5 mLs (5 mg total) by mouth 4 (four) times daily -  before meals  and at bedtime. (Patient not taking: Reported on 03/21/2020), Disp: 600 mL, Rfl: 0 .  Morphine Sulfate (MORPHINE CONCENTRATE) 10 MG/0.5ML SOLN concentrated solution, Take 0.25 mLs (5 mg total) by mouth every 4 (four) hours as needed for severe pain., Disp: 30 mL, Rfl: 0 .  nicotine (NICODERM CQ - DOSED IN MG/24 HOURS) 21 mg/24hr patch, One 21mg  patch chest wall daily (okay to substitute generic) (Patient not taking: Reported on 05/10/2020), Disp: 28 patch, Rfl: 0 .  Nutritional Supplements (FEEDING SUPPLEMENT, OSMOLITE 1.5 CAL,) LIQD, Place 237 mLs into feeding tube 5 (five) times daily., Disp: , Rfl: 0 .  ondansetron (ZOFRAN ODT) 4 MG disintegrating tablet, Take 1 tablet (4 mg total) by mouth every 8 (eight) hours as needed for nausea or vomiting., Disp: 20 tablet, Rfl: 0 .  PROAIR HFA 108 (90 Base) MCG/ACT inhaler, Inhale into the lungs., Disp: , Rfl:  .  Water For Irrigation, Sterile (FREE WATER) SOLN, Place 100 mLs into feeding tube 5 (five) times daily., Disp: 15000 mL, Rfl: 0 No current facility-administered medications for this  visit.  Facility-Administered Medications Ordered in Other Visits:  .  sodium chloride flush (NS) 0.9 % injection 10 mL, 10 mL, Intravenous, PRN, Creig Hines, MD, 10 mL at 05/10/20 0833  Physical exam:  Vitals:   05/24/20 0928  BP: 106/78  Pulse: (!) 126  Resp: 18  Temp: 98.3 F (36.8 C)  TempSrc: Tympanic  SpO2: 100%  Weight: 86 lb 14.4 oz (39.4 kg)   Physical Exam Constitutional:      Comments: Thin gentleman sitting in a wheelchair.  He is constantly spitting up his saliva  Eyes:     Extraocular Movements: EOM normal.  Cardiovascular:     Rate and Rhythm: Regular rhythm. Tachycardia present.     Heart sounds: Normal heart sounds.  Pulmonary:     Effort: Pulmonary effort is normal.     Breath sounds: Normal breath sounds.  Abdominal:     General: Bowel sounds are normal.     Palpations: Abdomen is soft.     Comments: PEG tube in place   Musculoskeletal:     Comments: Inability to abduct right arm beyond first 15 degrees  Lymphadenopathy:     Comments: Palpable right supraclavicular lymph node  Skin:    General: Skin is warm and dry.  Neurological:     Mental Status: He is alert and oriented to person, place, and time.      CMP Latest Ref Rng & Units 05/10/2020  Glucose 70 - 99 mg/dL 081(K)  BUN 8 - 23 mg/dL 18  Creatinine 4.81 - 8.56 mg/dL 3.14(H)  Sodium 702 - 637 mmol/L 134(L)  Potassium 3.5 - 5.1 mmol/L 3.7  Chloride 98 - 111 mmol/L 97(L)  CO2 22 - 32 mmol/L 25  Calcium 8.9 - 10.3 mg/dL 9.2  Total Protein 6.5 - 8.1 g/dL 7.2  Total Bilirubin 0.3 - 1.2 mg/dL 0.5  Alkaline Phos 38 - 126 U/L 68  AST 15 - 41 U/L 31  ALT 0 - 44 U/L 17   CBC Latest Ref Rng & Units 05/10/2020  WBC 4.0 - 10.5 K/uL 1.6(L)  Hemoglobin 13.0 - 17.0 g/dL 10.5(L)  Hematocrit 39.0 - 52.0 % 32.5(L)  Platelets 150 - 400 K/uL 200    No images are attached to the encounter.  CT Head Wo Contrast  Result Date: 05/07/2020 CLINICAL DATA:  Mental status change, unknown cause EXAM: CT HEAD WITHOUT CONTRAST TECHNIQUE: Contiguous axial images were obtained from the base of the skull through the vertex without intravenous contrast. COMPARISON:  None. FINDINGS: Brain: No acute infarct or intracranial hemorrhage. No mass lesion. No midline shift, ventriculomegaly or extra-axial fluid collection. Vascular: No hyperdense vessel or unexpected calcification. Skull: Negative for fracture or focal lesion. Sinuses/Orbits: Normal orbits. Clear paranasal sinuses. No mastoid effusion. Other: None. IMPRESSION: No acute intracranial process. Electronically Signed   By: Stana Bunting M.D.   On: 05/07/2020 07:09   DG ABDOMEN PEG TUBE LOCATION  Result Date: 04/28/2020 CLINICAL DATA:  Gastrostomy tube placement EXAM: ABDOMEN - 1 VIEW COMPARISON:  None. FINDINGS: 2 supine frontal views of the abdomen and pelvis were obtained after the clinician instilled  contrast via the indwelling percutaneous gastrostomy tube. Contrast is seen outlining the gastric rugal folds and proximal duodenum. Catheter is seen within the gastric lumen. No bowel obstruction or ileus. IMPRESSION: 1. Percutaneous gastrostomy tube within the gastric lumen. Electronically Signed   By: Sharlet Salina M.D.   On: 04/28/2020 21:19   DG Chest Portable 1 View  Result Date: 05/07/2020  CLINICAL DATA:  AMS EXAM: PORTABLE CHEST 1 VIEW COMPARISON:  03/12/2020 and prior FINDINGS: Right chest wall Port-A-Cath tip overlies the cavoatrial junction. Cardiomediastinal silhouette within normal limits. No pneumothorax or pleural effusion. Hazy perihilar and bibasilar opacities are unchanged. No acute osseous abnormality. IMPRESSION: Hazy perihilar/bibasilar opacities, unchanged. Electronically Signed   By: Primitivo Gauze M.D.   On: 05/07/2020 07:04     Assessment and plan- Patient is a 64 y.o. male with h/o squamous cell carcinoma of the lower third of the esophagus with supraclavicular lymph node metastases.He is here for on treatment assessment prior to cycle 4 of palliative FOLFOX  And Opdivochemotherapy  Treatment was held 2 weeks ago due to chemo-induced neutropenia which is now resolved.  Patient is also completed palliative radiation to his esophagus ending on 05/16/2020.  Counts okay to proceed with cycle 4 of FOLFOX chemotherapy and Opdivo.  He will come on day 3 for pump disconnect and receive udenyca.  Severe protein calorie malnutrition: PEG tube in place and patient states that he has been using 5 cartons of tube feeds a day.  He is following up with nutrition as well.  Plan to get repeat scans after 6 cycles  Acute right shoulder pain: Plan to get MRI cervical spine and right shoulder with and without contrast for further evaluation especially given his history of esophageal cancer   Visit Diagnosis 1. Encounter for antineoplastic chemotherapy   2. Primary cancer of  esophagus with metastasis to other site ([place])   3. Acute pain of right shoulder      Dr. Randa Evens, MD, MPH [facility] [id number] 05/24/2020 10:06 AM

## 2020-05-26 ENCOUNTER — Other Ambulatory Visit: Payer: Self-pay

## 2020-05-26 ENCOUNTER — Inpatient Hospital Stay: Payer: Medicaid Other

## 2020-05-26 DIAGNOSIS — C159 Malignant neoplasm of esophagus, unspecified: Secondary | ICD-10-CM

## 2020-05-26 DIAGNOSIS — Z5112 Encounter for antineoplastic immunotherapy: Secondary | ICD-10-CM | POA: Diagnosis not present

## 2020-05-26 MED ORDER — HEPARIN SOD (PORK) LOCK FLUSH 100 UNIT/ML IV SOLN
INTRAVENOUS | Status: AC
  Filled 2020-05-26: qty 5

## 2020-05-26 MED ORDER — SODIUM CHLORIDE 0.9% FLUSH
10.0000 mL | INTRAVENOUS | Status: AC | PRN
Start: 2020-05-26 — End: ?
  Filled 2020-05-26: qty 10

## 2020-05-26 MED ORDER — HEPARIN SOD (PORK) LOCK FLUSH 100 UNIT/ML IV SOLN
500.0000 [IU] | Freq: Once | INTRAVENOUS | Status: AC | PRN
Start: 2020-05-26 — End: ?
  Filled 2020-05-26: qty 5

## 2020-05-26 MED ORDER — PEGFILGRASTIM-CBQV 6 MG/0.6ML ~~LOC~~ SOSY
6.0000 mg | PREFILLED_SYRINGE | Freq: Once | SUBCUTANEOUS | Status: AC
  Administered 2020-05-26: 13:00:00 6 mg via SUBCUTANEOUS
  Filled 2020-05-26: qty 0.6

## 2020-05-26 NOTE — Progress Notes (Signed)
Nutrition Follow-up:  Patient with stage IV esophageal cancer.  Patient receiving chemotherapy and radiation therapy.   Met with patient following pump removal.  Patient reports that he is giving 6 cartons of tube feeding each day.  Starts at 6am and then gives 1 carton q 3 hours.  Flushes with 15ml of water before and after each feeding. Denies stomach upset or full feeling.  Reports that he is tolerating feeding well. Normal bowel movement.  Patient spitting during visit.  Patient says that he may miss a feeding if he is at the cancer center.     Medications: reviewed  Labs: reviewed  Anthropometrics:   Weight 86 lb 14.4 oz on 12/28 86 lb on 12/14 per medical oncology 87 lb on 11/11   Estimated Energy Needs  Kcals: 1500-1700 Protein: 75-85 g Fluid: > 1.5 L  NUTRITION DIAGNOSIS: Inadequate oral intake continues relying on feeding tube     INTERVENTION:  Recommend patient continue osmolite 1.5, 6 cartons per day (1 carton 6 times per day).  Flush with 73ml of water before and after each feeding.  Does not need additional water flush between feeding as giving medications via tube as well and Na trending down. Handwritten instructions given to patient. RD to notify [place] regarding increase in tube feeding from 5 cartons per day to 6 per day.  Tube feeding provides 2130 calories, 89 g protein and 1637ml free water.    MONITORING, EVALUATION, GOAL: weight trends, tube feeding   NEXT VISIT: Jan 27 after MD visit  Maygan Koeller B. Zenia Resides, [place], [place] Registered Dietitian [phone] (mobile)

## 2020-05-31 ENCOUNTER — Other Ambulatory Visit: Payer: Self-pay

## 2020-05-31 ENCOUNTER — Ambulatory Visit
Admission: RE | Admit: 2020-05-31 | Discharge: 2020-05-31 | Disposition: A | Discharge status: Home / Self Care | Payer: Medicaid Other | Source: Ambulatory Visit | Attending: Oncology | Admitting: Oncology

## 2020-05-31 DIAGNOSIS — M25511 Pain in right shoulder: Secondary | ICD-10-CM

## 2020-05-31 DIAGNOSIS — C159 Malignant neoplasm of esophagus, unspecified: Secondary | ICD-10-CM | POA: Diagnosis present

## 2020-05-31 MED ORDER — GADOBUTROL 1 MMOL/ML IV SOLN
4.0000 mL | Freq: Once | INTRAVENOUS | Status: AC | PRN
  Administered 2020-05-31: 4 mL via INTRAVENOUS

## 2020-06-01 ENCOUNTER — Other Ambulatory Visit: Payer: Medicaid Other | Admitting: Hospice

## 2020-06-07 ENCOUNTER — Inpatient Hospital Stay: Payer: Medicaid Other | Attending: Oncology

## 2020-06-07 ENCOUNTER — Encounter: Payer: Self-pay | Admitting: Oncology

## 2020-06-07 ENCOUNTER — Inpatient Hospital Stay (HOSPITAL_BASED_OUTPATIENT_CLINIC_OR_DEPARTMENT_OTHER): Payer: Medicaid Other | Admitting: Oncology

## 2020-06-07 ENCOUNTER — Other Ambulatory Visit: Payer: Self-pay

## 2020-06-07 ENCOUNTER — Inpatient Hospital Stay: Payer: Medicaid Other

## 2020-06-07 ENCOUNTER — Inpatient Hospital Stay (HOSPITAL_BASED_OUTPATIENT_CLINIC_OR_DEPARTMENT_OTHER): Payer: Medicaid Other | Admitting: Hospice and Palliative Medicine

## 2020-06-07 VITALS — BP 97/76 | HR 122 | Temp 99.2°F | Resp 16 | Wt 85.9 lb

## 2020-06-07 DIAGNOSIS — Z452 Encounter for adjustment and management of vascular access device: Secondary | ICD-10-CM | POA: Insufficient documentation

## 2020-06-07 DIAGNOSIS — C159 Malignant neoplasm of esophagus, unspecified: Secondary | ICD-10-CM

## 2020-06-07 DIAGNOSIS — Z515 Encounter for palliative care: Secondary | ICD-10-CM

## 2020-06-07 DIAGNOSIS — Z5111 Encounter for antineoplastic chemotherapy: Secondary | ICD-10-CM | POA: Diagnosis present

## 2020-06-07 DIAGNOSIS — G893 Neoplasm related pain (acute) (chronic): Secondary | ICD-10-CM

## 2020-06-07 DIAGNOSIS — C155 Malignant neoplasm of lower third of esophagus: Secondary | ICD-10-CM | POA: Diagnosis present

## 2020-06-07 DIAGNOSIS — E878 Other disorders of electrolyte and fluid balance, not elsewhere classified: Secondary | ICD-10-CM

## 2020-06-07 DIAGNOSIS — C770 Secondary and unspecified malignant neoplasm of lymph nodes of head, face and neck: Secondary | ICD-10-CM | POA: Insufficient documentation

## 2020-06-07 LAB — COMPREHENSIVE METABOLIC PANEL
ALT: 20 U/L (ref 0–44)
AST: 37 U/L (ref 15–41)
Albumin: 3.2 g/dL — ABNORMAL LOW (ref 3.5–5.0)
Alkaline Phosphatase: 170 U/L — ABNORMAL HIGH (ref 38–126)
Anion gap: 7 (ref 5–15)
BUN: 24 mg/dL — ABNORMAL HIGH (ref 8–23)
CO2: 31 mmol/L (ref 22–32)
Calcium: 9.9 mg/dL (ref 8.9–10.3)
Chloride: 99 mmol/L (ref 98–111)
Creatinine, Ser: 0.57 mg/dL — ABNORMAL LOW (ref 0.61–1.24)
GFR, Estimated: >60 mL/min (ref >60)
Glucose, Bld: 123 mg/dL — ABNORMAL HIGH (ref 70–99)
Potassium: 3.7 mmol/L (ref 3.5–5.1)
Sodium: 137 mmol/L (ref 135–145)
Total Bilirubin: 0.2 mg/dL — ABNORMAL LOW (ref 0.3–1.2)
Total Protein: 8.1 g/dL (ref 6.5–8.1)

## 2020-06-07 LAB — CBC WITH DIFFERENTIAL/PLATELET
Abs Immature Granulocytes: 6.34 10*3/uL — ABNORMAL HIGH (ref 0.00–0.07)
Basophils Absolute: 0 10*3/uL (ref 0.0–0.1)
Basophils Relative: 0 %
Eosinophils Absolute: 0 10*3/uL (ref 0.0–0.5)
Eosinophils Relative: 0 %
HCT: 37.5 % — ABNORMAL LOW (ref 39.0–52.0)
Hemoglobin: 12.1 g/dL — ABNORMAL LOW (ref 13.0–17.0)
Immature Granulocytes: 19 %
Lymphocytes Relative: 3 %
Lymphs Abs: 0.9 10*3/uL (ref 0.7–4.0)
MCH: 29.3 pg (ref 26.0–34.0)
MCHC: 32.3 g/dL (ref 30.0–36.0)
MCV: 90.8 fL (ref 80.0–100.0)
Monocytes Absolute: 2.7 10*3/uL — ABNORMAL HIGH (ref 0.1–1.0)
Monocytes Relative: 8 %
Neutro Abs: 23.4 10*3/uL — ABNORMAL HIGH (ref 1.7–7.7)
Neutrophils Relative %: 70 %
Platelets: 219 10*3/uL (ref 150–400)
RBC: 4.13 MIL/uL — ABNORMAL LOW (ref 4.22–5.81)
RDW: 20.8 % — ABNORMAL HIGH (ref 11.5–15.5)
Smear Review: NORMAL
WBC: 33.4 10*3/uL — ABNORMAL HIGH (ref 4.0–10.5)
nRBC: 0.1 % (ref 0.0–0.2)

## 2020-06-07 MED ORDER — OXALIPLATIN CHEMO INJECTION 100 MG/20ML
65.0000 mg/m2 | Freq: Once | INTRAVENOUS | Status: AC
  Administered 2020-06-07: 90 mg via INTRAVENOUS
  Filled 2020-06-07: qty 18

## 2020-06-07 MED ORDER — DEXTROSE 5 % IV SOLN
Freq: Once | INTRAVENOUS | Status: AC
  Filled 2020-06-07: qty 250

## 2020-06-07 MED ORDER — MORPHINE SULFATE (CONCENTRATE) 10 MG/0.5ML PO SOLN: 5.0000 mg | ORAL | 0 refills | Status: DC | PRN

## 2020-06-07 MED ORDER — LEUCOVORIN CALCIUM INJECTION 350 MG
550.0000 mg | Freq: Once | INTRAVENOUS | Status: AC
  Administered 2020-06-07: 550 mg via INTRAVENOUS
  Filled 2020-06-07: qty 17.5

## 2020-06-07 MED ORDER — MORPHINE SULFATE (PF) 2 MG/ML IV SOLN
4.0000 mg | Freq: Once | INTRAVENOUS | Status: AC
  Administered 2020-06-07: 4 mg via INTRAVENOUS
  Filled 2020-06-07: qty 2

## 2020-06-07 MED ORDER — MORPHINE SULFATE (CONCENTRATE) 10 MG/0.5ML PO SOLN: 5.0000 mg | ORAL | 0 refills | Status: AC | PRN

## 2020-06-07 MED ORDER — SODIUM CHLORIDE 0.9 % IV SOLN
Freq: Once | INTRAVENOUS | Status: AC
  Filled 2020-06-07: qty 250

## 2020-06-07 MED ORDER — SODIUM CHLORIDE 0.9 % IV SOLN
INTRAVENOUS | Status: DC
  Filled 2020-06-07: qty 250

## 2020-06-07 MED ORDER — FLUOROURACIL CHEMO INJECTION 2.5 GM/50ML
400.0000 mg/m2 | Freq: Once | INTRAVENOUS | Status: AC
  Administered 2020-06-07: 550 mg via INTRAVENOUS
  Filled 2020-06-07: qty 11

## 2020-06-07 MED ORDER — SODIUM CHLORIDE 0.9% FLUSH
10.0000 mL | INTRAVENOUS | Status: DC | PRN
  Administered 2020-06-07: 10 mL via INTRAVENOUS
  Filled 2020-06-07: qty 10

## 2020-06-07 MED ORDER — FLUOROURACIL CHEMO INJECTION 5 GM/100ML
2400.0000 mg/m2 | INTRAVENOUS | Status: DC
  Administered 2020-06-07: 3300 mg via INTRAVENOUS
  Filled 2020-06-07: qty 66

## 2020-06-07 MED ORDER — PALONOSETRON HCL INJECTION 0.25 MG/5ML
0.2500 mg | Freq: Once | INTRAVENOUS | Status: AC
  Administered 2020-06-07: 0.25 mg via INTRAVENOUS
  Filled 2020-06-07: qty 5

## 2020-06-07 MED ORDER — SODIUM CHLORIDE 0.9 % IV SOLN
10.0000 mg | Freq: Once | INTRAVENOUS | Status: AC
  Administered 2020-06-07: 10 mg via INTRAVENOUS
  Filled 2020-06-07: qty 10

## 2020-06-07 NOTE — Progress Notes (Signed)
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 Marius Ditch as an outpatient for symptoms of dysphagia. He underwent EGD on 03/02/2020 which showed a large fungating and ulcerating mass with bleeding and stigmata of recent bleeding in the distal esophagus 30 cm from the incisors. The degree of obstruction was severe enough that the scope could not be passed beyond the distal esophagus. Patient was supposed to see me today as an outpatient but then got admitted to the hospital with severe dysphagia to the point that he was not able to swallow anythingand had a PEG tube placement.  CT chest abdomen pelvis with contrast shows a large irregular ulcerated mass in the distal third of the esophagus with proximal esophagus being dilated and fluid-filled. Bilateral paratracheal and mediastinal adenopathy noted. Also found to have a 2.6 cm right supraclavicular lymph node no evidence of liver or other organ metastases.Supraclavicular lymph node was biopsied and was consistent with squamous cell carcinoma  Plan is for palliative FOLFOX Opdivo treatment along with palliative radiation to the lower esophageal mass. CPS 15.   Interval history-reports ongoing fatigue. Still complains of right shoulder pain which mainly starts around his right neck and radiates down to his shoulder as well as  around his neck. He is using tube feeds 6 cartons a day.  ECOG PS- 2 Pain scale- 5 Opioid associated constipation- no  Review of systems- Review of Systems  Constitutional: Positive for malaise/fatigue. Negative for chills, fever and weight loss.  HENT: Negative for congestion, ear discharge and nosebleeds.   Eyes: Negative for blurred vision.  Respiratory: Negative for cough, hemoptysis, sputum production, shortness of breath and wheezing.   Cardiovascular: Negative for chest pain, palpitations, orthopnea and claudication.  Gastrointestinal: Negative for abdominal pain, blood in stool, constipation, diarrhea, heartburn, melena, nausea and vomiting.  Genitourinary: Negative for dysuria, flank pain, frequency, hematuria and urgency.  Musculoskeletal: Negative for back pain, joint pain and myalgias.       Right neck and shoulder pain  Skin: Negative for rash.  Neurological: Negative for dizziness, tingling, focal weakness, seizures, weakness and headaches.  Endo/Heme/Allergies: Does not bruise/bleed easily.  Psychiatric/Behavioral: Negative for depression and suicidal ideas. The patient does not have insomnia.       No Known Allergies   Past Medical History:  Diagnosis Date  . esophageal cancer      Past Surgical History:  Procedure Laterality Date  . ESOPHAGOGASTRODUODENOSCOPY (EGD) WITH PROPOFOL N/A 03/02/2020   Procedure: ESOPHAGOGASTRODUODENOSCOPY (EGD) WITH PROPOFOL;  Surgeon: Lin Landsman, MD;  Location: [place];  Service: Gastroenterology;  Laterality: N/A;  . GASTROSTOMY TUBE PLACEMENT  03/08/2020  . MASS BIOPSY N/A 03/08/2020   Procedure: NECK MASS BIOPSY;  Surgeon: Jules Husbands, MD;  Location: [facility];  Service: General;  Laterality: N/A;  . PORTACATH PLACEMENT N/A 03/08/2020   Procedure: INSERTION PORT-A-CATH;  Surgeon: Jules Husbands, MD;  Location: [facility];  Service: General;  Laterality: N/A;    Social History   Socioeconomic History  .  Marital status: Single    Spouse name: Not on file  . Number of children: Not on file  . Years of education: Not on file  . Highest education level: Not on file  Occupational History  . Not on file  Tobacco Use  . Smoking status: Former Smoker    Packs/day: 0.50    Types: Cigarettes  . Smokeless tobacco: Never Used  . Tobacco comment: QUIT 3 WKS AGO  Vaping Use  . Vaping Use: Never used  Substance and Sexual Activity  . Alcohol use: Not Currently  . Drug use: Not Currently  . Sexual activity: Not Currently  Other Topics Concern  . Not on file  Social History Narrative  . Not on file   Social Determinants of Health   Financial Resource Strain: Not on file  Food Insecurity: Not on file  Transportation Needs: Not on file  Physical Activity: Not on file  Stress: Not on file  Social Connections: Not on file  Intimate Partner Violence: Not on file    Family History  Problem Relation Age of Onset  . Cancer Mother        lung     Current Outpatient Medications:  .  acetaminophen (TYLENOL) 325 MG tablet, Place 2 tablets (650 mg total) into feeding tube every 6 (six) hours as needed for mild pain (or Fever >/= 101)., Disp: , Rfl:  .  nicotine (NICODERM CQ - DOSED IN MG/24 HOURS) 21 mg/24hr patch, One 21mg  patch chest wall daily (okay to substitute generic), Disp: 28 patch, Rfl: 0 .  Nutritional Supplements (FEEDING SUPPLEMENT, OSMOLITE 1.5 CAL,) LIQD, Place 237 mLs into feeding tube 5 (five) times daily., Disp: , Rfl: 0 .  ondansetron (ZOFRAN ODT) 4 MG disintegrating tablet, Take 1 tablet (4 mg total) by mouth every 8 (eight) hours as needed for nausea or vomiting., Disp: 20 tablet, Rfl: 0 .  PROAIR HFA 108 (90 Base) MCG/ACT inhaler, Inhale into the lungs., Disp: , Rfl:  .  Water For Irrigation, Sterile (FREE WATER) SOLN, Place 100 mLs into feeding tube 5 (five) times daily., Disp: 15000 mL, Rfl: 0 .  azithromycin (ZITHROMAX) 250 MG tablet, Take 2 tablets day 1, then 1 tablet  each day (crush the pills mix with water and put down g tube and flush before and after (Patient not taking: No sig reported), Disp: 6 each, Rfl: 0 .  fentaNYL (DURAGESIC) 25 MCG/HR, Place 1 patch onto the skin every 3 (three) days. To help with pain (Patient not taking: No sig reported), Disp: 5 patch, Rfl: 0 .  lidocaine-prilocaine (EMLA) cream, Apply to affected area once (Patient not taking: No sig reported), Disp: 30 g, Rfl: 3 .  Melatonin 1 MG/ML LIQD, Take 2-5 mg by mouth at bedtime as needed. (Patient not taking: Reported on 06/07/2020), Disp: 50 mL, Rfl: 3 .  metoCLOPramide (REGLAN) 5 MG/5ML solution, Take 5 mLs (5 mg total) by mouth 4 (four) times daily -  before meals and at bedtime. (Patient not taking: Reported on 03/21/2020), Disp: 600 mL, Rfl: 0 .  Morphine Sulfate (MORPHINE CONCENTRATE) 10 MG/0.5ML SOLN concentrated solution, Take 0.25-0.5 mLs (5-10 mg total) by mouth every 4 (four) hours as needed for severe pain., Disp: 30 mL, Rfl: 0 No current facility-administered medications for this visit.  Facility-Administered Medications Ordered in Other Visits:  .  0.9 %  sodium chloride infusion, , Intravenous, Continuous, Randa Evens  C, MD, Last Rate: 20 mL/hr at 06/07/20 1113, New Bag at 06/07/20 1113 .  fluorouracil (ADRUCIL) 3,300 mg in sodium chloride 0.9 % 84 mL chemo infusion, 2,400 mg/m2 (Treatment Plan Recorded), Intravenous, 1 day or 1 dose, Sindy Guadeloupe, MD .  fluorouracil (ADRUCIL) chemo injection 550 mg, 400 mg/m2 (Treatment Plan Recorded), Intravenous, Once, Sindy Guadeloupe, MD .  heparin lock flush 100 unit/mL, 500 Units, Intracatheter, Once PRN, Sindy Guadeloupe, MD .  leucovorin 550 mg in dextrose 5 % 250 mL infusion, 550 mg, Intravenous, Once, Sindy Guadeloupe, MD, Last Rate: 139 mL/hr at 06/07/20 1144, 550 mg at 06/07/20 1144 .  oxaliplatin (ELOXATIN) 90 mg in dextrose 5 % 500 mL chemo infusion, 65 mg/m2 (Treatment Plan Recorded), Intravenous, Once, Sindy Guadeloupe, MD, Last  Rate: 259 mL/hr at 06/07/20 1143, 90 mg at 06/07/20 1143 .  sodium chloride flush (NS) 0.9 % injection 10 mL, 10 mL, Intravenous, PRN, Sindy Guadeloupe, MD, 10 mL at 05/10/20 [id number] .  sodium chloride flush (NS) 0.9 % injection 10 mL, 10 mL, Intracatheter, PRN, Sindy Guadeloupe, MD .  sodium chloride flush (NS) 0.9 % injection 10 mL, 10 mL, Intravenous, PRN, Sindy Guadeloupe, MD, 10 mL at 06/07/20 0909  Physical exam:  Vitals:   06/07/20 0925  BP: 97/76  Pulse: (!) 122  Resp: 16  Temp: 99.2 F (37.3 C)  TempSrc: Tympanic  SpO2: 100%  Weight: 85 lb 14.4 oz (39 kg)   Physical Exam Constitutional:      Comments: He is thin and appears fatigued. Sitting in a wheelchair  Eyes:     Extraocular Movements: EOM normal.  Cardiovascular:     Rate and Rhythm: Regular rhythm. Tachycardia present.     Heart sounds: Normal heart sounds.  Pulmonary:     Effort: Pulmonary effort is normal.     Breath sounds: Normal breath sounds.  Abdominal:     General: Bowel sounds are normal.     Palpations: Abdomen is soft.     Comments: PEG tube in place  Musculoskeletal:     Cervical back: Normal range of motion.  Lymphadenopathy:     Comments: Large palpable right supraclavicular adenopathy  Skin:    General: Skin is warm and dry.  Neurological:     Mental Status: He is alert and oriented to person, place, and time.      CMP Latest Ref Rng & Units 06/07/2020  Glucose 70 - 99 mg/dL 123(H)  BUN 8 - 23 mg/dL 24(H)  Creatinine 0.61 - 1.24 mg/dL 0.57(L)  Sodium 135 - 145 mmol/L 137  Potassium 3.5 - 5.1 mmol/L 3.7  Chloride 98 - 111 mmol/L 99  CO2 22 - 32 mmol/L 31  Calcium 8.9 - 10.3 mg/dL 9.9  Total Protein 6.5 - 8.1 g/dL 8.1  Total Bilirubin 0.3 - 1.2 mg/dL 0.2(L)  Alkaline Phos 38 - 126 U/L 170(H)  AST 15 - 41 U/L 37  ALT 0 - 44 U/L 20   CBC Latest Ref Rng & Units 06/07/2020  WBC 4.0 - 10.5 K/uL 33.4(H)  Hemoglobin 13.0 - 17.0 g/dL 12.1(L)  Hematocrit 39.0 - 52.0 % 37.5(L)  Platelets 150 - 400  K/uL 219    No images are attached to the encounter.  MR Cervical Spine W Wo Contrast  Result Date: 05/31/2020 CLINICAL DATA:  History of stage IV esophageal cancer. Cervical spine and right shoulder pain. EXAM: MRI CERVICAL SPINE WITHOUT AND WITH CONTRAST TECHNIQUE: Multiplanar and  multiecho pulse sequences of the cervical spine, to include the craniocervical junction and cervicothoracic junction, were obtained without and with intravenous contrast. CONTRAST:  32mL GADAVIST GADOBUTROL 1 MMOL/ML IV SOLN COMPARISON:  PET-CT 03/23/2020 FINDINGS: Alignment: Normal alignment. Vertebrae: No bone lesions are identified.  No fractures. Cord: Normal cord signal intensity. No cord lesions or syrinx. No abnormal cord enhancement. Posterior Fossa, vertebral arteries, paraspinal tissues: Large right supraclavicular mass. The medial component appears moderately necrotic. This is likely metastatic adenopathy. They could be involvement of the brachial plexus. Disc levels: No disc protrusions, significant spinal or foraminal stenosis. Mild bulging discs at C5-6 and C6-7. IMPRESSION: 1. Large right supraclavicular mass likely metastatic adenopathy. Possible involvement of the medial brachial plexus. 2. No disc protrusions, significant spinal or foraminal stenosis. 3. No bone lesions to suggest osseous metastatic disease. Electronically Signed   By: Marijo Sanes M.D.   On: 05/31/2020 16:46   MR SHOULDER RIGHT W WO CONTRAST  Result Date: 05/31/2020 CLINICAL DATA:  Right shoulder pain.  Metastatic esophageal cancer. EXAM: MRI OF THE RIGHT SHOULDER WITHOUT AND WITH CONTRAST TECHNIQUE: Multiplanar, multisequence MR imaging of the right shoulder was performed before and after the administration of intravenous contrast. CONTRAST:  74mL GADAVIST GADOBUTROL 1 MMOL/ML IV SOLN COMPARISON:  PET-CT 03/23/2020 and MRI cervical spine 05/31/2020 FINDINGS: Rotator cuff: Moderate rotator cuff tendinopathy/tendinosis. No full-thickness  retracted rotator cuff tear. Muscles: Moderate edema like signal changes involving the subscapularis muscle. Nonspecific myositis or possible muscle tear. Denervation edema is also possibility given the large right supraclavicular mass and possible brachial plexus involvement. Biceps long head:  Intact Acromioclavicular Joint: Mild degenerative changes. Type 2 acromion. No lateral downsloping or undersurface spurring. Glenohumeral Joint: Mild degenerative chondrosis. No joint effusion or synovitis. Labrum:  No definite labral tears. Bones: No worrisome bone lesions or areas of abnormal bone enhancement to suggest metastatic disease. Other: No subacromial/subdeltoid fluid collections to suggest bursitis. IMPRESSION: 1. Moderate rotator cuff tendinopathy/tendinosis. No full-thickness retracted rotator cuff tear. 2. Moderate edema like signal changes involving the subscapularis muscle. Nonspecific myositis or possible muscle tear. Denervation edema is also possibility given the large right supraclavicular mass and possible medial brachial plexus involvement. 3. Intact long head biceps tendon and glenoid labrum. 4. No significant findings to suggest bony metastatic disease. Electronically Signed   By: Marijo Sanes M.D.   On: 05/31/2020 16:50     Assessment and plan- Patient is a 65 y.o. male with h/o squamous cell carcinoma of the lower third of the esophagus with supraclavicular lymph node metastases.He is here for on treatment assessment prior to cycle 5 of palliative FOLFOX chemotherapy  Counts okay to proceed with cycle 5 of FOLFOX chemotherapy today. His white count is elevated at 33 from a prior dose of Udenyca that he received 2 weeks ago. He will therefore not received Udenyca on day 3 of pump disconnect. I will see him back in 2 weeks for cycle 6 of FOLFOX chemotherapy and he will also receive Opdivo on that day  Plan to get repeat CT chest abdomen pelvis with contrast in 3 weeks time  Right neck  and shoulder pain: MRI shows possible involvement of brachial plexus due to enlarged right supraclavicular lymph node. I will get in touch with Dr. Donella Stade to see if palliative radiation to this area is a possibility. I will also renew his prescription for morphine liquid  He will receive 1 L of IV fluids today.  Patient also continues to follow-up with palliative care  Visit Diagnosis 1. Primary cancer of esophagus with metastasis to other site [facility])   2. Encounter for antineoplastic chemotherapy   3. Neoplasm related pain      Dr. Randa Evens, MD, MPH [facility] [phone] 06/07/2020 12:42 PM

## 2020-06-07 NOTE — Progress Notes (Signed)
Pt evaluated by MD prior to coming to infusion. Hydration / Morphine 4mg  IV ordered as well as tx. Pt states pain is 10/10 . Hydration and Morphine given. Pt states has relief from pain after Morphine. Josh Borders NP also in infusion to evaluate pt. tx given as ordered. Completed without incident. Pt discharged stable.

## 2020-06-07 NOTE — Progress Notes (Signed)
[place]  Telephone:([phone] [phone]   Name: Andrew Pratt Date: 06/07/2020 MRN: [id number]  DOB: 1955-09-11  Patient Care Team: Sindy Guadeloupe, MD as PCP - General (Oncology) Clent Jacks, RN as Oncology Nurse Navigator Sindy Guadeloupe, MD as Consulting Physician (Hematology and Oncology)    REASON FOR CONSULTATION: Andrew Pratt is a 65 y.o. 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  Patient is status post PEG tube placement.  He has had significant symptom burden with dysphagia and difficulty managing oral secretions.  He is also have pain and constipation.  Palliative care was consulted help address goals and manage ongoing symptoms.  SOCIAL HISTORY:     reports that he has quit smoking. His smoking use included cigarettes. He smoked 0.50 packs per day. He has never used smokeless tobacco. He reports previous alcohol use. He reports previous drug use.   Patient was never married has no children.  He lives with a nephew and nephew's wife.  He has 12 brothers and sisters.  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  ADVANCE DIRECTIVES:  Does not have  CODE STATUS:   PAST MEDICAL HISTORY: Past Medical History:  Diagnosis Date  . esophageal cancer     PAST SURGICAL HISTORY:  Past Surgical History:  Procedure Laterality Date  . ESOPHAGOGASTRODUODENOSCOPY (EGD) WITH PROPOFOL N/A 03/02/2020   Procedure: ESOPHAGOGASTRODUODENOSCOPY (EGD) WITH PROPOFOL;  Surgeon: Lin Landsman, MD;  Location: [facility];  Service: Gastroenterology;  Laterality: N/A;  . GASTROSTOMY TUBE PLACEMENT  03/08/2020  . MASS BIOPSY N/A 03/08/2020   Procedure: NECK MASS BIOPSY;  Surgeon: Jules Husbands, MD;  Location: [facility];  Service: General;  Laterality: N/A;  .  PORTACATH PLACEMENT N/A 03/08/2020   Procedure: INSERTION PORT-A-CATH;  Surgeon: Jules Husbands, MD;  Location: [facility];  Service: General;  Laterality: N/A;    HEMATOLOGY/ONCOLOGY HISTORY:  Oncology History  Primary cancer of esophagus with metastasis to other site ([facility])  03/07/2020 Initial Diagnosis   Primary cancer of esophagus with metastasis to other site [facility])   03/22/2020 -  Chemotherapy    Patient is on Treatment Plan: GASTROESOPHAGEAL FOLFOX D1,15 + NIVOLUMAB Q28D        ALLERGIES:  has No Known Allergies.  MEDICATIONS:  Current Outpatient Medications  Medication Sig Dispense Refill  . acetaminophen (TYLENOL) 325 MG tablet Place 2 tablets (650 mg total) into feeding tube every 6 (six) hours as needed for mild pain (or Fever >/= 101).    Marland Kitchen azithromycin (ZITHROMAX) 250 MG tablet Take 2 tablets day 1, then 1 tablet each day (crush the pills mix with water and put down g tube and flush before and after (Patient not taking: No sig reported) 6 each 0  . fentaNYL (DURAGESIC) 25 MCG/HR Place 1 patch onto the skin every 3 (three) days. To help with pain (Patient not taking: No sig reported) 5 patch 0  . lidocaine-prilocaine (EMLA) cream Apply to affected area once (Patient not taking: No sig reported) 30 g 3  . Melatonin 1 MG/ML LIQD Take 2-5 mg by mouth at bedtime as needed. (Patient not taking: Reported on 06/07/2020) 50 mL 3  . metoCLOPramide (REGLAN) 5 MG/5ML solution Take 5 mLs (5 mg total) by mouth 4 (four) times daily -  before meals and at bedtime. (Patient  not taking: Reported on 03/21/2020) 600 mL 0  . Morphine Sulfate (MORPHINE CONCENTRATE) 10 MG/0.5ML SOLN concentrated solution Take 0.25-0.5 mLs (5-10 mg total) by mouth every 4 (four) hours as needed for severe pain. 30 mL 0  . nicotine (NICODERM CQ - DOSED IN MG/24 HOURS) 21 mg/24hr patch One 21mg  patch chest wall daily (okay to substitute generic) 28 patch 0  . Nutritional Supplements (FEEDING SUPPLEMENT, OSMOLITE 1.5  CAL,) LIQD Place 237 mLs into feeding tube 5 (five) times daily.  0  . ondansetron (ZOFRAN ODT) 4 MG disintegrating tablet Take 1 tablet (4 mg total) by mouth every 8 (eight) hours as needed for nausea or vomiting. 20 tablet 0  . PROAIR HFA 108 (90 Base) MCG/ACT inhaler Inhale into the lungs.    . Water For Irrigation, Sterile (FREE WATER) SOLN Place 100 mLs into feeding tube 5 (five) times daily. 15000 mL 0   No current facility-administered medications for this visit.   Facility-Administered Medications Ordered in Other Visits  Medication Dose Route Frequency Provider Last Rate Last Admin  . 0.9 %  sodium chloride infusion   Intravenous Continuous Sindy Guadeloupe, MD 20 mL/hr at 06/07/20 1113 New Bag at 06/07/20 1113  . dexamethasone (DECADRON) 10 mg in sodium chloride 0.9 % 50 mL IVPB  10 mg Intravenous Once Sindy Guadeloupe, MD 204 mL/hr at 06/07/20 1116 10 mg at 06/07/20 1116  . dextrose 5 % solution   Intravenous Once Sindy Guadeloupe, MD      . fluorouracil (ADRUCIL) 3,300 mg in sodium chloride 0.9 % 84 mL chemo infusion  2,400 mg/m2 (Treatment Plan Recorded) Intravenous 1 day or 1 dose Sindy Guadeloupe, MD      . fluorouracil (ADRUCIL) chemo injection 550 mg  400 mg/m2 (Treatment Plan Recorded) Intravenous Once Sindy Guadeloupe, MD      . heparin lock flush 100 unit/mL  500 Units Intracatheter Once PRN Sindy Guadeloupe, MD      . leucovorin 550 mg in dextrose 5 % 250 mL infusion  550 mg Intravenous Once Sindy Guadeloupe, MD      . oxaliplatin (ELOXATIN) 90 mg in dextrose 5 % 500 mL chemo infusion  65 mg/m2 (Treatment Plan Recorded) Intravenous Once Sindy Guadeloupe, MD      . sodium chloride flush (NS) 0.9 % injection 10 mL  10 mL Intravenous PRN Sindy Guadeloupe, MD   10 mL at 05/10/20 0833  . sodium chloride flush (NS) 0.9 % injection 10 mL  10 mL Intracatheter PRN Sindy Guadeloupe, MD      . sodium chloride flush (NS) 0.9 % injection 10 mL  10 mL Intravenous PRN Sindy Guadeloupe, MD   10 mL at 06/07/20 0909     VITAL SIGNS: There were no vitals taken for this visit. There were no vitals filed for this visit.  Estimated body mass index is 16.23 kg/m as calculated from the following:   Height as of 05/06/20: 5\' 1"  (1.549 m).   Weight as of an earlier encounter on 06/07/20: 85 lb 14.4 oz (39 kg).  LABS: CBC:    Component Value Date/Time   WBC 33.4 (H) 06/07/2020 0902   HGB 12.1 (L) 06/07/2020 0902   HGB 10.6 (L) 02/15/2020 1553   HCT 37.5 (L) 06/07/2020 0902   HCT 33.5 (L) 02/15/2020 1553   PLT 219 06/07/2020 0902   PLT 611 (H) 02/15/2020 1553   MCV 90.8 06/07/2020 0902   MCV 78 (  L) 02/15/2020 1553   NEUTROABS 23.4 (H) 06/07/2020 0902   LYMPHSABS 0.9 06/07/2020 0902   MONOABS 2.7 (H) 06/07/2020 0902   EOSABS 0.0 06/07/2020 0902   BASOSABS 0.0 06/07/2020 0902   Comprehensive Metabolic Panel:    Component Value Date/Time   NA 137 06/07/2020 0902   NA 140 02/15/2020 1553   K 3.7 06/07/2020 0902   CL 99 06/07/2020 0902   CO2 31 06/07/2020 0902   BUN 24 (H) 06/07/2020 0902   BUN 5 (L) 02/15/2020 1553   CREATININE 0.57 (L) 06/07/2020 0902   GLUCOSE 123 (H) 06/07/2020 0902   CALCIUM 9.9 06/07/2020 0902   AST 37 06/07/2020 0902   ALT 20 06/07/2020 0902   ALKPHOS 170 (H) 06/07/2020 0902   BILITOT 0.2 (L) 06/07/2020 0902   BILITOT <0.2 02/15/2020 1553   PROT 8.1 06/07/2020 0902   PROT 6.8 02/15/2020 1553   ALBUMIN 3.2 (L) 06/07/2020 0902   ALBUMIN 3.8 02/15/2020 1553    RADIOGRAPHIC STUDIES: MR Cervical Spine W Wo Contrast  Result Date: 05/31/2020 CLINICAL DATA:  History of stage IV esophageal cancer. Cervical spine and right shoulder pain. EXAM: MRI CERVICAL SPINE WITHOUT AND WITH CONTRAST TECHNIQUE: Multiplanar and multiecho pulse sequences of the cervical spine, to include the craniocervical junction and cervicothoracic junction, were obtained without and with intravenous contrast. CONTRAST:  49mL GADAVIST GADOBUTROL 1 MMOL/ML IV SOLN COMPARISON:  PET-CT 03/23/2020 FINDINGS:  Alignment: Normal alignment. Vertebrae: No bone lesions are identified.  No fractures. Cord: Normal cord signal intensity. No cord lesions or syrinx. No abnormal cord enhancement. Posterior Fossa, vertebral arteries, paraspinal tissues: Large right supraclavicular mass. The medial component appears moderately necrotic. This is likely metastatic adenopathy. They could be involvement of the brachial plexus. Disc levels: No disc protrusions, significant spinal or foraminal stenosis. Mild bulging discs at C5-6 and C6-7. IMPRESSION: 1. Large right supraclavicular mass likely metastatic adenopathy. Possible involvement of the medial brachial plexus. 2. No disc protrusions, significant spinal or foraminal stenosis. 3. No bone lesions to suggest osseous metastatic disease. Electronically Signed   By: Marijo Sanes M.D.   On: 05/31/2020 16:46   MR SHOULDER RIGHT W WO CONTRAST  Result Date: 05/31/2020 CLINICAL DATA:  Right shoulder pain.  Metastatic esophageal cancer. EXAM: MRI OF THE RIGHT SHOULDER WITHOUT AND WITH CONTRAST TECHNIQUE: Multiplanar, multisequence MR imaging of the right shoulder was performed before and after the administration of intravenous contrast. CONTRAST:  33mL GADAVIST GADOBUTROL 1 MMOL/ML IV SOLN COMPARISON:  PET-CT 03/23/2020 and MRI cervical spine 05/31/2020 FINDINGS: Rotator cuff: Moderate rotator cuff tendinopathy/tendinosis. No full-thickness retracted rotator cuff tear. Muscles: Moderate edema like signal changes involving the subscapularis muscle. Nonspecific myositis or possible muscle tear. Denervation edema is also possibility given the large right supraclavicular mass and possible brachial plexus involvement. Biceps long head:  Intact Acromioclavicular Joint: Mild degenerative changes. Type 2 acromion. No lateral downsloping or undersurface spurring. Glenohumeral Joint: Mild degenerative chondrosis. No joint effusion or synovitis. Labrum:  No definite labral tears. Bones: No worrisome  bone lesions or areas of abnormal bone enhancement to suggest metastatic disease. Other: No subacromial/subdeltoid fluid collections to suggest bursitis. IMPRESSION: 1. Moderate rotator cuff tendinopathy/tendinosis. No full-thickness retracted rotator cuff tear. 2. Moderate edema like signal changes involving the subscapularis muscle. Nonspecific myositis or possible muscle tear. Denervation edema is also possibility given the large right supraclavicular mass and possible medial brachial plexus involvement. 3. Intact long head biceps tendon and glenoid labrum. 4. No significant findings to suggest bony metastatic  disease. Electronically Signed   By: Marijo Sanes M.D.   On: 05/31/2020 16:50    PERFORMANCE STATUS (ECOG) : 2 - Symptomatic, <50% confined to bed  Review of Systems Unless otherwise noted, a complete review of systems is negative.  Physical Exam General: NAD Pulmonary: Unlabored Abdomen: PEG noted GU: no suprapubic tenderness Extremities: no edema, no joint deformities Skin: no rashes Neurological: Weakness but otherwise nonfocal  IMPRESSION: Routine follow-up visit.  Patient was seen in infusion.  Patient continues to endorse right shoulder pain.  MRI of the right shoulder on 05/31/2020 revealed moderate rotator cuff tendinosis without tear and possible nonspecific myositis of the subscapularis muscle.  MRI cervical spine on 1/4 revealed a large right supraclavicular mass likely metastatic adenopathy with involvement of the medial brachial plexus.  This certainly could be etiology of his pain.  He has been referred for consideration of XRT.  Today, he says that he has been out of the morphine for some time.  He does feel that the morphine was helping at the higher dose of 10 mg.  He is no longer on transdermal fentanyl.  I previously considered extensive ER, however, we will refill his morphine today and get a better idea of how much he is requiring each 24-hour period prior to  restarting and LAO.  Patient denies any other significant changes or concerns.  He denies issues at home.  However, family support has been limited in the past and they have explored options for placement.  It is unclear if he has had evaluation by social work in the home.  I will reach out to [facility] regarding home resources.   PLAN: -Continue current scope of treatment -Refill morphine elixir 5 to 10 mg every 4 hours as needed for pain (#30 mL) -ACP/MOST form previously reviewed -RTC 3 weeks  Case and plan discussed with Dr. Janese Banks   Patient expressed understanding and was in agreement with this plan. He also understands that He can call the clinic at any time with any questions, concerns, or complaints.     Time Total: 15 minutes  Visit consisted of counseling and education dealing with the complex and emotionally intense issues of symptom management and palliative care in the setting of serious and potentially life-threatening illness.Greater than 50%  of this time was spent counseling and coordinating care related to the above assessment and plan.  Signed by: Altha Harm, PhD, NP-C

## 2020-06-08 ENCOUNTER — Telehealth: Payer: Self-pay | Admitting: *Deleted

## 2020-06-08 NOTE — Telephone Encounter (Signed)
Called both Andrew Pratt and his niece to inform of appt. To see Dr. Baruch Gouty tomorrow after he gets pump taken off.   Neither person answered, voice mail left on both.

## 2020-06-09 ENCOUNTER — Inpatient Hospital Stay: Payer: Medicaid Other

## 2020-06-09 ENCOUNTER — Encounter: Payer: Self-pay | Admitting: Radiation Oncology

## 2020-06-09 ENCOUNTER — Other Ambulatory Visit: Payer: Self-pay

## 2020-06-09 ENCOUNTER — Ambulatory Visit
Admission: RE | Admit: 2020-06-09 | Discharge: 2020-06-09 | Disposition: A | Discharge status: Home / Self Care | Payer: Medicaid Other | Source: Ambulatory Visit | Attending: Radiation Oncology | Admitting: Radiation Oncology

## 2020-06-09 VITALS — BP 92/71 | HR 114 | Temp 97.9°F | Resp 16 | Wt 87.0 lb

## 2020-06-09 DIAGNOSIS — C159 Malignant neoplasm of esophagus, unspecified: Secondary | ICD-10-CM

## 2020-06-09 DIAGNOSIS — Z5111 Encounter for antineoplastic chemotherapy: Secondary | ICD-10-CM | POA: Diagnosis not present

## 2020-06-09 DIAGNOSIS — C770 Secondary and unspecified malignant neoplasm of lymph nodes of head, face and neck: Secondary | ICD-10-CM | POA: Insufficient documentation

## 2020-06-09 DIAGNOSIS — C155 Malignant neoplasm of lower third of esophagus: Secondary | ICD-10-CM | POA: Diagnosis not present

## 2020-06-09 MED ORDER — HEPARIN SOD (PORK) LOCK FLUSH 100 UNIT/ML IV SOLN
500.0000 [IU] | Freq: Once | INTRAVENOUS | Status: AC | PRN
  Administered 2020-06-09: 500 [IU]
  Filled 2020-06-09: qty 5

## 2020-06-09 MED ORDER — SODIUM CHLORIDE 0.9% FLUSH
10.0000 mL | INTRAVENOUS | Status: DC | PRN
  Administered 2020-06-09: 10 mL
  Filled 2020-06-09: qty 10

## 2020-06-09 NOTE — Progress Notes (Signed)
Radiation Oncology Follow up Note old patient new area right supraclavicular metastatic disease  Name: Andrew Pratt   Date:   06/09/2020 MRN:  [id number] DOB: December 19, 1955    This 65 y.o. 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  REFERRING PROVIDER: Sindy Guadeloupe, MD  HPI: Patient is a 65 year old male who is previous completed palliative radiation therapy for stage IV squamous cell carcinoma the esophagus..  Patient has received FOLFOX chemotherapy.  We did do palliative radiation therapy on his esophagus although he continues to have problems with swallowing.  He is seen today for significant evaluation of increased right shoulder and neck pain.  Initial PET CT scan showed hypermetabolic activity in the right supraclavicular fossa consistent with metastatic adenopathy.  MRI also showed possible involvement of the brachial plexus due to the enlarged right supraclavicular lymph nodes.  Dr. Was asked to see me to see the patient for consideration of palliative radiation therapy.  He is currently narcotic dependent.  COMPLICATIONS OF TREATMENT: none  FOLLOW UP COMPLIANCE: keeps appointments   PHYSICAL EXAM:  BP 92/71 (BP Location: Left Arm, Patient Position: Sitting)   Pulse (!) 114   Temp 97.9 F (36.6 C) (Tympanic)   Resp 16   Wt 87 lb (39.5 kg)   BMI 16.44 kg/m  No change in motor or sensory levels in his upper extremities is noted.  Does have prominence of the right supraclavicular fossa.  Frail-appearing well-developed well-nourished patient in NAD. HEENT reveals PERLA, EOMI, discs not visualized.  Oral cavity is clear. No oral mucosal lesions are identified. Neck is clear without evidence of cervical or supraclavicular adenopathy. Lungs are clear to A&P. Cardiac examination is essentially unremarkable with  regular rate and rhythm without murmur rub or thrill. Abdomen is benign with no organomegaly or masses noted. Motor sensory and DTR levels are equal and symmetric in the upper and lower extremities. Cranial nerves II through XII are grossly intact. Proprioception is intact. No peripheral adenopathy or edema is identified. No motor or sensory levels are noted. Crude visual fields are within normal range.  RADIOLOGY RESULTS: PET/CT and MRI scans are reviewed compatible with above-stated findings  PLAN: At this time like to go ahead with palliative radiation therapy to his right supraclavicular region it would use PET and MRI criteria for delineation of treatment fields.  We will plan delivering 30 Gray in 10 fractions.  Risks and benefits of treatment occluding skin reaction fatigue possible slight chance of esophagitis all were discussed in detail with the patient.  I have personally set up and ordered CT simulation.  Patient comprehends my recommendations well.  I would like to take this opportunity to thank you for allowing me to participate in the care of your patient.Noreene Filbert, MD

## 2020-06-13 NOTE — Progress Notes (Signed)
.  The following Assist/Replace Program for Feraheme from [place] has been terminated due to Medicaid starting 03/28/2020.  Last DOS:04/07/2020

## 2020-06-13 NOTE — Progress Notes (Signed)
..  The following Assist/Replace Program for Opdivo from [place] has been terminated due to Medicaid starting 03/28/2020.  Last DOS:05/04/2020

## 2020-06-14 ENCOUNTER — Inpatient Hospital Stay: Payer: Medicaid Other

## 2020-06-14 ENCOUNTER — Other Ambulatory Visit: Payer: Self-pay

## 2020-06-14 ENCOUNTER — Ambulatory Visit
Admission: RE | Admit: 2020-06-14 | Discharge: 2020-06-14 | Disposition: A | Discharge status: Home / Self Care | Payer: Medicaid Other | Source: Ambulatory Visit | Attending: Radiation Oncology | Admitting: Radiation Oncology

## 2020-06-14 DIAGNOSIS — C155 Malignant neoplasm of lower third of esophagus: Secondary | ICD-10-CM | POA: Insufficient documentation

## 2020-06-14 DIAGNOSIS — Z510 Encounter for antineoplastic radiation therapy: Secondary | ICD-10-CM | POA: Insufficient documentation

## 2020-06-15 ENCOUNTER — Other Ambulatory Visit: Payer: Self-pay

## 2020-06-15 ENCOUNTER — Other Ambulatory Visit: Payer: Medicaid Other | Admitting: Hospice

## 2020-06-15 DIAGNOSIS — E860 Dehydration: Secondary | ICD-10-CM | POA: Diagnosis present

## 2020-06-15 DIAGNOSIS — Z515 Encounter for palliative care: Secondary | ICD-10-CM

## 2020-06-15 DIAGNOSIS — C770 Secondary and unspecified malignant neoplasm of lymph nodes of head, face and neck: Secondary | ICD-10-CM | POA: Diagnosis present

## 2020-06-15 DIAGNOSIS — E43 Unspecified severe protein-calorie malnutrition: Secondary | ICD-10-CM | POA: Diagnosis present

## 2020-06-15 DIAGNOSIS — R5381 Other malaise: Secondary | ICD-10-CM | POA: Diagnosis present

## 2020-06-15 DIAGNOSIS — E870 Hyperosmolality and hypernatremia: Secondary | ICD-10-CM | POA: Diagnosis present

## 2020-06-15 DIAGNOSIS — J690 Pneumonitis due to inhalation of food and vomit: Secondary | ICD-10-CM | POA: Diagnosis present

## 2020-06-15 DIAGNOSIS — C159 Malignant neoplasm of esophagus, unspecified: Secondary | ICD-10-CM

## 2020-06-15 DIAGNOSIS — Z681 Body mass index (BMI) 19 or less, adult: Secondary | ICD-10-CM

## 2020-06-15 DIAGNOSIS — K222 Esophageal obstruction: Secondary | ICD-10-CM | POA: Diagnosis present

## 2020-06-15 DIAGNOSIS — F17200 Nicotine dependence, unspecified, uncomplicated: Secondary | ICD-10-CM | POA: Diagnosis present

## 2020-06-15 DIAGNOSIS — R778 Other specified abnormalities of plasma proteins: Secondary | ICD-10-CM | POA: Diagnosis present

## 2020-06-15 DIAGNOSIS — Z931 Gastrostomy status: Secondary | ICD-10-CM

## 2020-06-15 DIAGNOSIS — R296 Repeated falls: Secondary | ICD-10-CM | POA: Diagnosis present

## 2020-06-15 DIAGNOSIS — I959 Hypotension, unspecified: Secondary | ICD-10-CM | POA: Diagnosis present

## 2020-06-15 DIAGNOSIS — C7951 Secondary malignant neoplasm of bone: Secondary | ICD-10-CM | POA: Diagnosis present

## 2020-06-15 DIAGNOSIS — Z66 Do not resuscitate: Secondary | ICD-10-CM | POA: Diagnosis present

## 2020-06-15 DIAGNOSIS — Z923 Personal history of irradiation: Secondary | ICD-10-CM

## 2020-06-15 DIAGNOSIS — U071 COVID-19: Secondary | ICD-10-CM | POA: Diagnosis present

## 2020-06-15 DIAGNOSIS — R627 Adult failure to thrive: Secondary | ICD-10-CM | POA: Diagnosis present

## 2020-06-15 DIAGNOSIS — Z79899 Other long term (current) drug therapy: Secondary | ICD-10-CM

## 2020-06-15 DIAGNOSIS — K631 Perforation of intestine (nontraumatic): Principal | ICD-10-CM | POA: Diagnosis present

## 2020-06-15 NOTE — Progress Notes (Signed)
[facility] Consult Note Telephone: [phone]  Fax: [phone]  PATIENT NAME: Andrew Pratt DOB: 05-13-1956 MRN: [id number]  PRIMARY CARE PROVIDER:   Sindy Guadeloupe, MD Sindy Guadeloupe, MD [address] [place],  [place] [address]  REFERRING PROVIDER: Altha Harm NP  RESPONSIBLE PARTY:   Extended Emergency Contact Information Primary Emergency Contact: Sterling Mobile Phone: [phone] Relation: Sister Secondary Emergency Contact: STEPHENS,DEREK Address: [address], [place] [address] Johnnette Litter of [place] Phone: [phone] Mobile Phone: [phone] Relation: Nephew  TELEHEALTH VISIT STATEMENT Due to the COVID-19 crisis, this visit was done via telephone from my office. It was initiated and consented to by this patient and/or family.   ADVANCE CARE PLANNING/RECOMMENDATIONS/PLAN:     Visit consisted of building trust and discussions on Palliative Medicine as specialized medical care for people living with serious illness, aimed at facilitating better quality of life through symptoms relief, assisting with advance care plan and establishing complex decision making.   Palliative care team will continue to support patient, patient's family, and medical team.  Advance Care Planning/CODE STATUS: Ramifications and implications of CODE STATUS discussed today.  [name] and his wife Aarion Prose were present with patient.  It was agreed that family will discuss further with other family members before a change is made. . Patient is currently a full code.   GOALS OF CARE: Goals of care include to maximize quality of life and symptom management.  Patient/family wishes patient to be able to eat by mouth some day; to regain his strength.   Follow up Palliative Care Visit: Palliative care will continue to follow for goals of care clarification and symptom management.  Follow-up in 2 months/as  needed.  Symptom Management:  Patient continues to receive chemotherapy and radiation for metastatic esophageal cancer; well-tolerated. Patient is nothing by mouth; gets Osmolite via Peg tube, not able to swallow saliva, spitting into a cup.  He denied nausea/vomiting. He has Ondansteron prn.  Continue fentanyl patch and morphine as ordered.  No adverse reactions, no constipation Weakness: Report of weakness and gait instability. Discussion on balance of rest and activity performance.   Recommendation for physical therapy to PCP.  PCP responded promptly, saying she will put in a PT referral for patient.  Patient is able to carry out his activities of daily living, per [place].  She verbalized understanding to report when patient needs assistance-Home health service.  Referring provider updated on visit. Palliative will continue to monitor for symptom management/decline and make recommendations as needed.  Family /Caregiver/Community Supports: Patient lives at home with his nephew [name] and nephew's wife Varney Biles.  They are involved in his care.  Strong family support system identified.  I spent 1 hour and 16 providing this consultation; time includes time spent with patient/family, chart review, provider coordination,  and documentation. More than 50% of the time in this consultation was spent on counseling patient and coordinating communication.   CHIEF COMPLAIN/HISTORY OF PRESENT ILLNESS:  MARTINE TRAGESER is a 65 y.o. male with multiple medical problems including metastatic esophageal.  History obtained from review of EMR, discussion patient and his nephew [name].   Palliative Care was asked to follow this patient by consultation request of Altha Harm NP to help address advance care planning and complex decision making. Thank you for the opportunity to participate in the care of [place]: Full  PPS:  50% HOSPICE ELIGIBILITY/DIAGNOSIS: TBD  PAST MEDICAL HISTORY:   Past Medical History:  Diagnosis Date  . esophageal cancer     SOCIAL HX:  Social History   Tobacco Use  . Smoking status: Former Smoker    Packs/day: 0.50    Types: Cigarettes  . Smokeless tobacco: Never Used  . Tobacco comment: QUIT 3 WKS AGO  Substance Use Topics  . Alcohol use: Not Currently    ALLERGIES: No Known Allergies   PERTINENT MEDICATIONS:  Outpatient Encounter Medications as of 06/15/2020  Medication Sig  . acetaminophen (TYLENOL) 325 MG tablet Place 2 tablets (650 mg total) into feeding tube every 6 (six) hours as needed for mild pain (or Fever >/= 101).  Marland Kitchen azithromycin (ZITHROMAX) 250 MG tablet Take 2 tablets day 1, then 1 tablet each day (crush the pills mix with water and put down g tube and flush before and after (Patient not taking: No sig reported)  . fentaNYL (DURAGESIC) 25 MCG/HR Place 1 patch onto the skin every 3 (three) days. To help with pain (Patient not taking: No sig reported)  . lidocaine-prilocaine (EMLA) cream Apply to affected area once (Patient not taking: No sig reported)  . Melatonin 1 MG/ML LIQD Take 2-5 mg by mouth at bedtime as needed. (Patient not taking: No sig reported)  . metoCLOPramide (REGLAN) 5 MG/5ML solution Take 5 mLs (5 mg total) by mouth 4 (four) times daily -  before meals and at bedtime. (Patient not taking: Reported on 03/21/2020)  . Morphine Sulfate (MORPHINE CONCENTRATE) 10 MG/0.5ML SOLN concentrated solution Take 0.25-0.5 mLs (5-10 mg total) by mouth every 4 (four) hours as needed for severe pain.  . nicotine (NICODERM CQ - DOSED IN MG/24 HOURS) 21 mg/24hr patch One 21mg  patch chest wall daily (okay to substitute generic)  . Nutritional Supplements (FEEDING SUPPLEMENT, OSMOLITE 1.5 CAL,) LIQD Place 237 mLs into feeding tube 5 (five) times daily.  . ondansetron (ZOFRAN ODT) 4 MG disintegrating tablet Take 1 tablet (4 mg total) by mouth every 8 (eight) hours as needed for nausea or vomiting.  Marland Kitchen PROAIR HFA 108 (90 Base) MCG/ACT  inhaler Inhale into the lungs.  . Water For Irrigation, Sterile (FREE WATER) SOLN Place 100 mLs into feeding tube 5 (five) times daily.   Facility-Administered Encounter Medications as of 06/15/2020  Medication  . heparin lock flush 100 unit/mL  . sodium chloride flush (NS) 0.9 % injection 10 mL  . sodium chloride flush (NS) 0.9 % injection 10 mL    Note:  Portions of this note were generated with [employer]. Dictation errors may occur despite attempts at proofreading.  Teodoro Spray, NP

## 2020-06-16 ENCOUNTER — Inpatient Hospital Stay: Payer: Medicaid Other

## 2020-06-16 ENCOUNTER — Encounter: Payer: Self-pay | Admitting: Internal Medicine

## 2020-06-16 ENCOUNTER — Other Ambulatory Visit: Payer: Self-pay

## 2020-06-16 ENCOUNTER — Telehealth: Payer: Self-pay | Admitting: *Deleted

## 2020-06-16 ENCOUNTER — Inpatient Hospital Stay
Admission: EM | Admit: 2020-06-16 | Discharge: 2020-06-19 | DRG: 393 | Disposition: A | Discharge status: Hospice | Payer: Medicaid Other | Attending: Internal Medicine | Admitting: Internal Medicine

## 2020-06-16 DIAGNOSIS — W19XXXA Unspecified fall, initial encounter: Secondary | ICD-10-CM | POA: Diagnosis not present

## 2020-06-16 DIAGNOSIS — Z515 Encounter for palliative care: Secondary | ICD-10-CM | POA: Diagnosis not present

## 2020-06-16 DIAGNOSIS — Z681 Body mass index (BMI) 19 or less, adult: Secondary | ICD-10-CM | POA: Diagnosis not present

## 2020-06-16 DIAGNOSIS — Z931 Gastrostomy status: Secondary | ICD-10-CM | POA: Diagnosis not present

## 2020-06-16 DIAGNOSIS — E870 Hyperosmolality and hypernatremia: Secondary | ICD-10-CM | POA: Diagnosis present

## 2020-06-16 DIAGNOSIS — K222 Esophageal obstruction: Secondary | ICD-10-CM

## 2020-06-16 DIAGNOSIS — R198 Other specified symptoms and signs involving the digestive system and abdomen: Secondary | ICD-10-CM | POA: Diagnosis not present

## 2020-06-16 DIAGNOSIS — M549 Dorsalgia, unspecified: Secondary | ICD-10-CM

## 2020-06-16 DIAGNOSIS — R531 Weakness: Secondary | ICD-10-CM | POA: Diagnosis not present

## 2020-06-16 DIAGNOSIS — U071 COVID-19: Secondary | ICD-10-CM | POA: Diagnosis present

## 2020-06-16 DIAGNOSIS — C7951 Secondary malignant neoplasm of bone: Secondary | ICD-10-CM | POA: Diagnosis present

## 2020-06-16 DIAGNOSIS — Y92009 Unspecified place in unspecified non-institutional (private) residence as the place of occurrence of the external cause: Secondary | ICD-10-CM

## 2020-06-16 DIAGNOSIS — R627 Adult failure to thrive: Secondary | ICD-10-CM | POA: Diagnosis present

## 2020-06-16 DIAGNOSIS — Z79899 Other long term (current) drug therapy: Secondary | ICD-10-CM | POA: Diagnosis not present

## 2020-06-16 DIAGNOSIS — F17200 Nicotine dependence, unspecified, uncomplicated: Secondary | ICD-10-CM | POA: Diagnosis present

## 2020-06-16 DIAGNOSIS — E43 Unspecified severe protein-calorie malnutrition: Secondary | ICD-10-CM

## 2020-06-16 DIAGNOSIS — R296 Repeated falls: Secondary | ICD-10-CM | POA: Diagnosis present

## 2020-06-16 DIAGNOSIS — C770 Secondary and unspecified malignant neoplasm of lymph nodes of head, face and neck: Secondary | ICD-10-CM | POA: Diagnosis present

## 2020-06-16 DIAGNOSIS — Z66 Do not resuscitate: Secondary | ICD-10-CM | POA: Diagnosis present

## 2020-06-16 DIAGNOSIS — I959 Hypotension, unspecified: Secondary | ICD-10-CM | POA: Diagnosis present

## 2020-06-16 DIAGNOSIS — Z720 Tobacco use: Secondary | ICD-10-CM | POA: Diagnosis present

## 2020-06-16 DIAGNOSIS — K631 Perforation of intestine (nontraumatic): Secondary | ICD-10-CM | POA: Diagnosis not present

## 2020-06-16 DIAGNOSIS — R778 Other specified abnormalities of plasma proteins: Secondary | ICD-10-CM | POA: Diagnosis present

## 2020-06-16 DIAGNOSIS — R5381 Other malaise: Secondary | ICD-10-CM | POA: Diagnosis present

## 2020-06-16 DIAGNOSIS — C159 Malignant neoplasm of esophagus, unspecified: Secondary | ICD-10-CM | POA: Diagnosis present

## 2020-06-16 DIAGNOSIS — R55 Syncope and collapse: Secondary | ICD-10-CM

## 2020-06-16 DIAGNOSIS — E860 Dehydration: Secondary | ICD-10-CM | POA: Diagnosis present

## 2020-06-16 DIAGNOSIS — Z923 Personal history of irradiation: Secondary | ICD-10-CM | POA: Diagnosis not present

## 2020-06-16 DIAGNOSIS — J690 Pneumonitis due to inhalation of food and vomit: Secondary | ICD-10-CM | POA: Diagnosis present

## 2020-06-16 LAB — COMPREHENSIVE METABOLIC PANEL
ALT: 32 U/L (ref 0–44)
AST: 50 U/L — ABNORMAL HIGH (ref 15–41)
Albumin: 3.6 g/dL (ref 3.5–5.0)
Alkaline Phosphatase: 161 U/L — ABNORMAL HIGH (ref 38–126)
Anion gap: 15 (ref 5–15)
BUN: 35 mg/dL — ABNORMAL HIGH (ref 8–23)
CO2: 23 mmol/L (ref 22–32)
Calcium: 11.1 mg/dL — ABNORMAL HIGH (ref 8.9–10.3)
Chloride: 110 mmol/L (ref 98–111)
Creatinine, Ser: 0.83 mg/dL (ref 0.61–1.24)
GFR, Estimated: >60 mL/min (ref >60)
Glucose, Bld: 156 mg/dL — ABNORMAL HIGH (ref 70–99)
Potassium: 4.4 mmol/L (ref 3.5–5.1)
Sodium: 148 mmol/L — ABNORMAL HIGH (ref 135–145)
Total Bilirubin: 1.1 mg/dL (ref 0.3–1.2)
Total Protein: 9.1 g/dL — ABNORMAL HIGH (ref 6.5–8.1)

## 2020-06-16 LAB — TROPONIN I (HIGH SENSITIVITY)
Troponin I (High Sensitivity): 27 ng/L — ABNORMAL HIGH (ref <18)
Troponin I (High Sensitivity): 31 ng/L — ABNORMAL HIGH (ref <18)

## 2020-06-16 LAB — SARS CORONAVIRUS 2 BY RT PCR (HOSPITAL ORDER, PERFORMED IN ~~LOC~~ HOSPITAL LAB): SARS Coronavirus 2: POSITIVE — AB

## 2020-06-16 LAB — CBC
HCT: 45.2 % (ref 39.0–52.0)
Hemoglobin: 14.2 g/dL (ref 13.0–17.0)
MCH: 29.5 pg (ref 26.0–34.0)
MCHC: 31.4 g/dL (ref 30.0–36.0)
MCV: 94 fL (ref 80.0–100.0)
Platelets: 205 10*3/uL (ref 150–400)
RBC: 4.81 MIL/uL (ref 4.22–5.81)
RDW: 19.3 % — ABNORMAL HIGH (ref 11.5–15.5)
WBC: 6.7 10*3/uL (ref 4.0–10.5)
nRBC: 0.3 % — ABNORMAL HIGH (ref 0.0–0.2)

## 2020-06-16 MED ORDER — FREE WATER
100.0000 mL | Freq: Every day | Status: DC
  Administered 2020-06-16: 100 mL
  Filled 2020-06-16 (×5): qty 100

## 2020-06-16 MED ORDER — MORPHINE SULFATE (PF) 2 MG/ML IV SOLN: 2.0000 mg | INTRAVENOUS | Status: DC | PRN

## 2020-06-16 MED ORDER — NICOTINE 21 MG/24HR TD PT24
21.0000 mg | MEDICATED_PATCH | Freq: Every day | TRANSDERMAL | Status: DC
  Administered 2020-06-16: 21 mg via TRANSDERMAL
  Filled 2020-06-16: qty 1

## 2020-06-16 MED ORDER — IOHEXOL 9 MG/ML PO SOLN
500.0000 mL | ORAL | Status: AC
  Administered 2020-06-16 (×2): 500 mL via ORAL
  Filled 2020-06-16 (×2): qty 500

## 2020-06-16 MED ORDER — ENOXAPARIN SODIUM 30 MG/0.3ML ~~LOC~~ SOLN
30.0000 mg | SUBCUTANEOUS | Status: DC
  Filled 2020-06-16 (×2): qty 0.3

## 2020-06-16 MED ORDER — MORPHINE SULFATE (PF) 2 MG/ML IV SOLN
2.0000 mg | INTRAVENOUS | Status: DC | PRN
  Administered 2020-06-17 – 2020-06-19 (×3): 2 mg via INTRAVENOUS
  Filled 2020-06-16 (×3): qty 1

## 2020-06-16 MED ORDER — MORPHINE SULFATE (CONCENTRATE) 10 MG/0.5ML PO SOLN
5.0000 mg | ORAL | Status: DC | PRN
  Administered 2020-06-16: 10 mg via ORAL
  Filled 2020-06-16: qty 0.5

## 2020-06-16 MED ORDER — IOHEXOL 300 MG/ML  SOLN
60.0000 mL | Freq: Once | INTRAMUSCULAR | Status: AC | PRN
  Administered 2020-06-16: 60 mL via INTRAVENOUS
  Filled 2020-06-16: qty 60

## 2020-06-16 MED ORDER — ALBUTEROL SULFATE HFA 108 (90 BASE) MCG/ACT IN AERS
2.0000 | INHALATION_SPRAY | RESPIRATORY_TRACT | Status: DC | PRN
  Filled 2020-06-16: qty 6.7

## 2020-06-16 MED ORDER — IOHEXOL 350 MG/ML SOLN
75.0000 mL | Freq: Once | INTRAVENOUS | Status: AC | PRN
  Administered 2020-06-16: 75 mL via INTRAVENOUS
  Filled 2020-06-16: qty 75

## 2020-06-16 MED ORDER — ACETAMINOPHEN 325 MG PO TABS: 650.0000 mg | ORAL_TABLET | Freq: Four times a day (QID) | ORAL | Status: DC | PRN

## 2020-06-16 MED ORDER — OSMOLITE 1.5 CAL PO LIQD
237.0000 mL | Freq: Every day | ORAL | Status: DC
  Administered 2020-06-16 (×2): 237 mL

## 2020-06-16 MED ORDER — SODIUM CHLORIDE 0.9 % IV SOLN: INTRAVENOUS | Status: DC

## 2020-06-16 MED ORDER — ONDANSETRON HCL 4 MG PO TABS
4.0000 mg | ORAL_TABLET | Freq: Four times a day (QID) | ORAL | Status: DC | PRN
  Administered 2020-06-16: 4 mg via ORAL
  Filled 2020-06-16: qty 1

## 2020-06-16 MED ORDER — LACTATED RINGERS IV BOLUS
1000.0000 mL | Freq: Once | INTRAVENOUS | Status: AC
  Administered 2020-06-16: 1000 mL via INTRAVENOUS

## 2020-06-16 MED ORDER — ONDANSETRON HCL 4 MG/2ML IJ SOLN: 4.0000 mg | Freq: Four times a day (QID) | INTRAMUSCULAR | Status: DC | PRN

## 2020-06-16 NOTE — Evaluation (Signed)
Physical Therapy Evaluation Patient Details Name: Andrew Pratt MRN: [id number] DOB: February 12, 1956 Today's Date: 06/16/2020   History of Present Illness  presented to ER secondary to fall, generalized weakness (worsening over past 4 days); admitted for management of suspected dehydration, syncopal work up.  Clinical Impression  Upon evaluation, patient alert and oriented; follows commands and demonstrates good effort with mobility tasks.  Endorses moderate pain in R shoulder/back, FACES 4-6/10; orders for imaging placed mid-session.  Bilat UE/LE generally weak and deconditioned, very frail in appearance; however, no focal weakness or asymmetry appreciated.  Currently requiring min assist for bed mobility; min assist for sit/stand, static standing and lateral stepping edge of bed.  Demonstrates lateral stepping edge of bed, limited step height/length, limited balance and overall activity tolerance.  Additional distance declined per patient due to fatigue Orthostatic assessment-see vitals flowsheet for details. Unable to obtain standing BP measurement (difficulty with reading); noted at BP 101/77, HR 102 immediately upon sitting; BP 112/88, HR 112 end of session.  Does endorse progressive weakness with standing; anticipate underlying orthostasis despite difficulty with measurement Would benefit from skilled PT to address above deficits and promote optimal return to PLOF.; recommend transition to STR upon discharge from acute hospitalization.     Follow Up Recommendations SNF    Equipment Recommendations  Rolling walker with 5" wheels    Recommendations for Other Services       Precautions / Restrictions Precautions Precautions: Fall Precaution Comments: NPO Restrictions Weight Bearing Restrictions: No      Mobility  Bed Mobility Overal bed mobility: Needs Assistance Bed Mobility: Supine to Sit     Supine to sit: Min assist     General bed mobility comments: assist for truncal  elevation    Transfers Overall transfer level: Needs assistance Equipment used: Rolling walker (2 wheeled) Transfers: Sit to/from Stand Sit to Stand: Min assist            Ambulation/Gait Ambulation/Gait assistance: Min assist Gait Distance (Feet): 5 Feet Assistive device: Rolling walker (2 wheeled)       General Gait Details: lateral stepping edge of bed, limited step height/length, limited balance and overall activity tolerance.  Additional distance declined per patient due to fatigue  Stairs            Wheelchair Mobility    Modified Rankin (Stroke Patients Only)       Balance Overall balance assessment: Needs assistance Sitting-balance support: No upper extremity supported;Feet supported Sitting balance-Leahy Scale: Good     Standing balance support: Bilateral upper extremity supported Standing balance-Leahy Scale: Fair                               Pertinent Vitals/Pain Pain Assessment: Faces Faces Pain Scale: Hurts little more Pain Location: R shoulder/back Pain Descriptors / Indicators: Aching;Guarding Pain Intervention(s): Limited activity within patient's tolerance;Monitored during session;Repositioned;Patient requesting pain meds-RN notified    Home Living Family/patient expects to be discharged to:: Private residence Living Arrangements: Other relatives Available Help at Discharge: Family Type of Home: House Home Access: Stairs to enter Entrance Stairs-Rails: Right;Left;Can reach both Technical brewer of Steps: 5 Home Layout: One level   Additional Comments: Patient alone during the day while family works    Prior Function Level of Independence: Independent         Comments: Indep with ADLs, household and limited community mobiltiy.  Denies fall history outside of this admission.     Hand  Dominance        Extremity/Trunk Assessment   Upper Extremity Assessment Upper Extremity Assessment: Generalized  weakness    Lower Extremity Assessment Lower Extremity Assessment: Generalized weakness (globally weak and deconditioned, at least 3+ to 4-/5 throughout)       Communication   Communication:  (hoarse voice, poor vocal quality (history of throat cancer, current palliative chemo))  Cognition Arousal/Alertness: Awake/alert Behavior During Therapy: WFL for tasks assessed/performed Overall Cognitive Status: Within Functional Limits for tasks assessed                                        General Comments      Exercises Other Exercises Other Exercises: Orthostatuc assessment-see vitals flowsheet for details. Unable to obtain standing BP measurement (difficulty with reading); noted at BP 101/77, HR 102 immediately upon sitting; BP 112/88, HR 112 end of session.  Does endorse progressive weakness with standing; anticipate underlying orthostasis despite difficulty with measurement   Assessment/Plan    PT Assessment Patient needs continued PT services  PT Problem List Decreased strength;Decreased range of motion;Decreased activity tolerance;Decreased balance;Decreased mobility;Decreased knowledge of use of DME;Decreased safety awareness;Decreased knowledge of precautions;Cardiopulmonary status limiting activity;Pain       PT Treatment Interventions DME instruction;Gait training;Stair training;Therapeutic activities;Functional mobility training;Therapeutic exercise;Balance training;Patient/family education    PT Goals (Current goals can be found in the Care Plan section)  Acute Rehab PT Goals Patient Stated Goal: to get my strength back PT Goal Formulation: With patient Time For Goal Achievement: 06/30/20 Potential to Achieve Goals: Fair    Frequency Min 2X/week   Barriers to discharge        Co-evaluation               AM-PAC PT "6 Clicks" Mobility  Outcome Measure Help needed turning from your back to your side while in a flat bed without using  bedrails?: None Help needed moving from lying on your back to sitting on the side of a flat bed without using bedrails?: A Little Help needed moving to and from a bed to a chair (including a wheelchair)?: A Little Help needed standing up from a chair using your arms (e.g., wheelchair or bedside chair)?: A Little Help needed to walk in hospital room?: A Little Help needed climbing 3-5 steps with a railing? : A Little 6 Click Score: 19    End of Session   Activity Tolerance: Patient limited by pain;Patient limited by fatigue Patient left: in bed;with call bell/phone within reach Nurse Communication: Mobility status PT Visit Diagnosis: Muscle weakness (generalized) (M62.81);Difficulty in walking, not elsewhere classified (R26.2)    Time: 4742-5956 PT Time Calculation (min) (ACUTE ONLY): 19 min   Charges:   PT Evaluation $PT Eval Moderate Complexity: 1 Mod PT Treatments $Therapeutic Activity: 8-22 mins       Janyla Biscoe H. Owens Shark, PT, DPT, NCS 06/16/20, 11:10 AM [phone]

## 2020-06-16 NOTE — Progress Notes (Addendum)
Patient was seen in consultation by oncology and CT scan findings concerning for bowel perforation were discussed with patient and family.  Due to his overall poor prognosis and impaired functional status patient and his family do not want any aggressive treatments and he definitely does not want surgery.  They have opted for comfort measures.  Hospice consult has been placed. Will discontinue PT evaluation

## 2020-06-16 NOTE — ED Provider Notes (Signed)
[facility] Emergency Department Provider Note   ____________________________________________   Event Date/Time   First MD Initiated Contact with Patient 06/16/20 [phone]     (approximate)  I have reviewed the triage vital signs and the nursing notes.   HISTORY  Chief Complaint Fall and Sore Throat    HPI Andrew Pratt is a 65 y.o. male with a history of esophageal cancer on palliative chemotherapy and radiation who presents for generalized weakness and syncope that is been worsening over the past 4 days.  Patient states that he is now having falls and is unable to walk without significant difficulty.  Patient denies any recent medication changes.  Patient states that he is taking his tube feeds in their entirety as well as free water flushes without difficulty.  Patient endorses good urine output.  Patient states that his generalized weakness is constant however his lightheadedness and an episode of syncope occurred when he was standing from a seated position and attempted to get up and start walking.  Patient states that this initially started with only lightheadedness and now progressed to "blacking out".  Patient currently denies any vision changes, tinnitus, difficulty speaking, facial droop, sore throat, chest pain, shortness of breath, abdominal pain, nausea/vomiting/diarrhea, dysuria, or weakness/numbness/paresthesias in any extremity         Past Medical History:  Diagnosis Date  . esophageal cancer     Patient Active Problem List   Diagnosis Date Noted  . Generalized weakness 06/16/2020  . Iron deficiency anemia 03/24/2020  . Hypophosphatemia   . Aspiration pneumonia of both lower lobes ([place])   . Weakness   . Protein-calorie malnutrition, severe 03/07/2020  . 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 Goals of care, counseling/discussion   . Normocytic anemia   . Esophageal obstruction 03/06/2020  . Difficulty  swallowing 03/05/2020  . Hypokalemia 03/05/2020  . Leukocytosis 03/05/2020  . Tobacco abuse 03/05/2020  . Malignant neoplasm of lower third of esophagus ([place])   . Esophageal dysphagia     Past Surgical History:  Procedure Laterality Date  . ESOPHAGOGASTRODUODENOSCOPY (EGD) WITH PROPOFOL N/A 03/02/2020   Procedure: ESOPHAGOGASTRODUODENOSCOPY (EGD) WITH PROPOFOL;  Surgeon: Lin Landsman, MD;  Location: [facility];  Service: Gastroenterology;  Laterality: N/A;  . GASTROSTOMY TUBE PLACEMENT  03/08/2020  . MASS BIOPSY N/A 03/08/2020   Procedure: NECK MASS BIOPSY;  Surgeon: Jules Husbands, MD;  Location: [facility] ORS;  Service: General;  Laterality: N/A;  . PORTACATH PLACEMENT N/A 03/08/2020   Procedure: INSERTION PORT-A-CATH;  Surgeon: Jules Husbands, MD;  Location: [facility] ORS;  Service: General;  Laterality: N/A;    Prior to Admission medications   Medication Sig Start Date End Date Taking? Authorizing Provider  acetaminophen (TYLENOL) 325 MG tablet Place 2 tablets (650 mg total) into feeding tube every 6 (six) hours as needed for mild pain (or Fever >/= 101). 03/12/20   Loletha Grayer, MD  azithromycin (ZITHROMAX) 250 MG tablet Take 2 tablets day 1, then 1 tablet each day (crush the pills mix with water and put down g tube and flush before and after Patient not taking: No sig reported 05/10/20   Sindy Guadeloupe, MD  fentaNYL (DURAGESIC) 25 MCG/HR Place 1 patch onto the skin every 3 (three) days. To help with pain Patient not taking: No sig reported 03/21/20   Fulp, Cammie, MD  lidocaine-prilocaine (EMLA) cream Apply to affected area once Patient not taking: No sig reported 03/22/20  Sindy Guadeloupe, MD  Melatonin 1 MG/ML LIQD Take 2-5 mg by mouth at bedtime as needed. Patient not taking: No sig reported 05/10/20   Borders, Kirt Boys, NP  metoCLOPramide (REGLAN) 5 MG/5ML solution Take 5 mLs (5 mg total) by mouth 4 (four) times daily -  before meals and at bedtime. Patient not taking:  Reported on 03/21/2020 03/12/20 04/11/20  Loletha Grayer, MD  Morphine Sulfate (MORPHINE CONCENTRATE) 10 MG/0.5ML SOLN concentrated solution Take 0.25-0.5 mLs (5-10 mg total) by mouth every 4 (four) hours as needed for severe pain. 06/07/20   Borders, Kirt Boys, NP  nicotine (NICODERM CQ - DOSED IN MG/24 HOURS) 21 mg/24hr patch One 21mg  patch chest wall daily (okay to substitute generic) 03/12/20   Loletha Grayer, MD  Nutritional Supplements (FEEDING SUPPLEMENT, OSMOLITE 1.5 CAL,) LIQD Place 237 mLs into feeding tube 5 (five) times daily. 03/12/20   Loletha Grayer, MD  ondansetron (ZOFRAN ODT) 4 MG disintegrating tablet Take 1 tablet (4 mg total) by mouth every 8 (eight) hours as needed for nausea or vomiting. 03/12/20   Loletha Grayer, MD  PROAIR HFA 108 [phone] Base) MCG/ACT inhaler Inhale into the lungs. 04/12/20   [provider]  Water For Irrigation, Sterile (FREE WATER) SOLN Place 100 mLs into feeding tube 5 (five) times daily. 03/12/20   Loletha Grayer, MD    Allergies Patient has no known allergies.  Family History  Problem Relation Age of Onset  . Cancer Mother        lung    Social History Social History   Tobacco Use  . Smoking status: Former Smoker    Packs/day: 0.50    Types: Cigarettes  . Smokeless tobacco: Never Used  . Tobacco comment: QUIT 3 WKS AGO  Vaping Use  . Vaping Use: Never used  Substance Use Topics  . Alcohol use: Not Currently  . Drug use: Not Currently    Review of Systems Constitutional: No fever/chills Eyes: No visual changes. ENT: Endorses chronic sore throat. Cardiovascular: Denies chest pain. Respiratory: Denies shortness of breath. Gastrointestinal: No abdominal pain.  No nausea, no vomiting.  No diarrhea. Genitourinary: Negative for dysuria. Musculoskeletal: Negative for acute arthralgias Skin: Negative for rash. Neurological: Negative for headaches, endorses generalized weakness, denies numbness/paresthesias in any  extremity Psychiatric: Negative for suicidal ideation/homicidal ideation   ____________________________________________   PHYSICAL EXAM:  VITAL SIGNS: ED Triage Vitals  Enc Vitals Group     BP 06/16/20 0001 106/62     Pulse Rate 06/16/20 0004 (!) 149     Resp 06/16/20 0004 20     Temp 06/16/20 0001 98.8 F (37.1 C)     Temp Source 06/16/20 0001 Oral     SpO2 06/16/20 0004 100 %     Weight 06/16/20 0002 88 lb 2.9 oz (40 kg)     Height --      Head Circumference --      Peak Flow --      Pain Score 06/16/20 0002 10     Pain Loc --      Pain Edu? --      Excl. in [place]? --    Constitutional: Alert and oriented.  Cachectic and in no acute distress. Eyes: Conjunctivae are normal. PERRL. Head: Atraumatic. Nose: No congestion/rhinnorhea. Mouth/Throat: Mucous membranes are moist. Neck: No stridor Cardiovascular: Grossly normal heart sounds.  Good peripheral circulation. Respiratory: Normal respiratory effort.  No retractions. Gastrointestinal: Soft and nontender. No distention. Musculoskeletal: No obvious deformities Neurologic:  Normal speech  and language. No gross focal neurologic deficits are appreciated. Skin:  Skin is warm and dry. No rash noted. Psychiatric: Mood and affect are normal. Speech and behavior are normal.  ____________________________________________   LABS (all labs ordered are listed, but only abnormal results are displayed)  Labs Reviewed  CBC - Abnormal; Notable for the following components:      Result Value   RDW 19.3 (*)    nRBC 0.3 (*)    All other components within normal limits  COMPREHENSIVE METABOLIC PANEL - Abnormal; Notable for the following components:   Sodium 148 (*)    Glucose, Bld 156 (*)    BUN 35 (*)    Calcium 11.1 (*)    Total Protein 9.1 (*)    AST 50 (*)    Alkaline Phosphatase 161 (*)    All other components within normal limits  TROPONIN I (HIGH SENSITIVITY) - Abnormal; Notable for the following components:   Troponin I  (High Sensitivity) 27 (*)    All other components within normal limits  TROPONIN I (HIGH SENSITIVITY) - Abnormal; Notable for the following components:   Troponin I (High Sensitivity) 31 (*)    All other components within normal limits  SARS CORONAVIRUS 2 BY RT PCR (HOSPITAL ORDER, [place] [facility])   ____________________________________________  EKG  ED ECG REPORT I, Naaman Plummer, the attending physician, personally viewed and interpreted this ECG.  Date: 06/16/2020 EKG Time: 0443 Rate: 138 Rhythm: Tachycardic sinus rhythm QRS Axis: normal Intervals: normal ST/T Wave abnormalities: normal Narrative Interpretation: no evidence of acute ischemia   PROCEDURES  Procedure(s) performed (including Critical Care):  .1-3 Lead EKG Interpretation Performed by: Naaman Plummer, MD Authorized by: Naaman Plummer, MD     Interpretation: abnormal     ECG rate:  138   ECG rate assessment: tachycardic     Rhythm: sinus tachycardia     Ectopy: none     Conduction: normal       ____________________________________________   INITIAL IMPRESSION / ASSESSMENT AND PLAN / ED COURSE  As part of my medical decision making, I reviewed the following data within the [facility] notes reviewed and incorporated, Labs reviewed, EKG interpreted, Old chart reviewed, Radiograph reviewed and Notes from prior ED visits reviewed and incorporated        Patient is a 65 year old male with a stated past medical history of esophageal cancer who presents for generalized weakness and syncope.  Differential diagnosis for this patient includes but is not limited to: dehydration, ACS, CVA, sepsis, chemotherapy/radiation side effect  Laboratory evaluation significant for elevated troponin at 27 that increased to 31 at 4 hours. Patient's tachycardia as well as contracted CBC and CMP are concerns for general dehydration  I was also messaged by patient's  oncologist, Dr. Janese Banks, who recommended patient for admission given poor social status at home and lack of adequate caregivers.  Dispo: Admit      ____________________________________________   FINAL CLINICAL IMPRESSION(S) / ED DIAGNOSES  Final diagnoses:  Fall in home, initial encounter  Generalized weakness  Syncope, unspecified syncope type  Elevated troponin     ED Discharge Orders    None       Note:  This document was prepared using Dragon voice recognition software and may include unintentional dictation errors.   Naaman Plummer, MD 06/16/20 [phone]

## 2020-06-16 NOTE — Progress Notes (Signed)
PHARMACIST - PHYSICIAN COMMUNICATION  CONCERNING:  Enoxaparin (Lovenox) for DVT Prophylaxis    RECOMMENDATION: Patient was prescribed enoxaprin 40mg  q24 hours for VTE prophylaxis.   Filed Weights   06/16/20 0002  Weight: 40 kg (88 lb 2.9 oz)    Body mass index is 16.66 kg/m.  Estimated Creatinine Clearance: 50.9 mL/min (by C-G formula based on SCr of 0.83 mg/dL).   Patient is candidate for enoxaparin 30mg  every 24 hours based on Weight <45kg  DESCRIPTION: Pharmacy has adjusted enoxaparin dose per [facility] policy.  Patient is now receiving enoxaparin 30 mg every 24 hours    Pernell Dupre, PharmD, BCPS Clinical Pharmacist 06/16/2020 10:43 AM

## 2020-06-16 NOTE — ED Notes (Signed)
Pt was only able to tolerate 31ml contrast via PEG. Placement checked prior to administration. Pt tolerated well, no distress noted. CT made aware.

## 2020-06-16 NOTE — Progress Notes (Signed)
[facility] Room ED 30 [facility]) Hospital Liaison RN note:  Received request from Dr. Francine Graven and Altha Harm, NP for [facility] services to follow patient. Notified Teresita Maxey, TOC via voicemail. [place] Liaison will follow up in the morning to determine eligibility and disposition and speak with family.  Thank you for the opportunity to participate in this patient's care.  Zandra Abts, RN [facility] Liaison [phone]

## 2020-06-16 NOTE — ED Notes (Signed)
Sent hospitalist message regarding pt requesting pain med

## 2020-06-16 NOTE — Consult Note (Signed)
Hematology/Oncology Consult note [facility] [phone] [phone]  Patient Care Team: Sindy Guadeloupe, MD as PCP - General (Oncology) Clent Jacks, RN as Oncology Nurse Navigator Sindy Guadeloupe, MD as Consulting Physician (Hematology and Oncology)   Name of the patient: Andrew Pratt  QU:[id number]  04-18-1956    Reason for consult: Metastatic esophageal cancer   Requesting physician: Dr. 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  He lives at home with his niece who brought him to the ER as patient was feeling weaker at home with gait instability and had a fall this morning.  Patient had a CT chest with contrast which showed enlarging thoracic inlet adenopathy with a mass that was measuring 8.5 x 5.6 cm as compared to 2.5 cm prior.  Moderately large volume of pneumoperitoneum concerning for hollow viscus perforation.  CT abdomen and pelvis with contrast showed small amount of free intraperitoneal air appears to be arising from the right colon.  Exact site of bowel perforation was not clear.  Patient currently appears comfortable in his bed.  He denies any abdominal pain.  Reports that he is passing gas and also had a bowel movement yesterday.  At baseline he cannot swallow much and is dependent on tube feeds for his nutrition.  ECOG PS- 3  Pain scale- 0   Review of systems- Review of Systems  Constitutional: Positive for malaise/fatigue. Negative for chills, fever and weight loss.  HENT: Negative for congestion, ear discharge and nosebleeds.   Eyes: Negative for blurred vision.  Respiratory: Negative for cough, hemoptysis, sputum production, shortness of breath and wheezing.   Cardiovascular: Negative for chest pain, palpitations, orthopnea  and claudication.  Gastrointestinal: Negative for abdominal pain, blood in stool, constipation, diarrhea, heartburn, melena, nausea and vomiting.  Genitourinary: Negative for dysuria, flank pain, frequency, hematuria and urgency.  Musculoskeletal: Negative for back pain, joint pain (Right shoulder pain) and myalgias.  Skin: Negative for rash.  Neurological: Negative for dizziness, tingling, focal weakness, seizures, weakness and headaches.  Endo/Heme/Allergies: Does not bruise/bleed easily.  Psychiatric/Behavioral: Negative for depression and suicidal ideas. The patient does not have insomnia.     No Known Allergies  Patient Active Problem List   Diagnosis Date Noted  . Generalized weakness 06/16/2020  . Hypercalcemia 06/16/2020  . Hypernatremia 06/16/2020  . Fall at home, initial encounter 06/16/2020  . Palliative care encounter   . Iron deficiency anemia 03/24/2020  . Hypophosphatemia   . Aspiration pneumonia of both lower lobes ([place])   . Weakness   . Protein-calorie malnutrition, severe 03/07/2020  . Primary cancer of esophagus with metastasis to other site [facility])   . Goals of care, counseling/discussion   . Normocytic anemia   . Esophageal obstruction 03/06/2020  . Difficulty swallowing 03/05/2020  . Hypokalemia 03/05/2020  . Leukocytosis 03/05/2020  . Tobacco abuse 03/05/2020  . Malignant neoplasm of lower third of esophagus ([place])   . Esophageal dysphagia      Past Medical History:  Diagnosis Date  . esophageal cancer      Past Surgical History:  Procedure Laterality Date  . ESOPHAGOGASTRODUODENOSCOPY (EGD) WITH PROPOFOL N/A 03/02/2020   Procedure: ESOPHAGOGASTRODUODENOSCOPY (EGD) WITH PROPOFOL;  Surgeon: Lin Landsman, MD;  Location: [facility];  Service: Gastroenterology;  Laterality: N/A;  . GASTROSTOMY TUBE  PLACEMENT  03/08/2020  . MASS BIOPSY N/A 03/08/2020   Procedure: NECK MASS BIOPSY;  Surgeon: Jules Husbands, MD;  Location: [facility];  Service:  General;  Laterality: N/A;  . PORTACATH PLACEMENT N/A 03/08/2020   Procedure: INSERTION PORT-A-CATH;  Surgeon: Jules Husbands, MD;  Location: [facility];  Service: General;  Laterality: N/A;    Social History   Socioeconomic History  . Marital status: Single    Spouse name: Not on file  . Number of children: Not on file  . Years of education: Not on file  . Highest education level: Not on file  Occupational History  . Not on file  Tobacco Use  . Smoking status: Former Smoker    Packs/day: 0.50    Types: Cigarettes  . Smokeless tobacco: Never Used  . Tobacco comment: QUIT 3 WKS AGO  Vaping Use  . Vaping Use: Never used  Substance and Sexual Activity  . Alcohol use: Not Currently  . Drug use: Not Currently  . Sexual activity: Not Currently  Other Topics Concern  . Not on file  Social History Narrative  . Not on file   Social Determinants of Health   Financial Resource Strain: Not on file  Food Insecurity: Not on file  Transportation Needs: Not on file  Physical Activity: Not on file  Stress: Not on file  Social Connections: Not on file  Intimate Partner Violence: Not on file     Family History  Problem Relation Age of Onset  . Cancer Mother        lung     Current Facility-Administered Medications:  .  0.9 %  sodium chloride infusion, , Intravenous, Continuous, Agbata, Tochukwu, MD, Last Rate: 100 mL/hr at 06/16/20 1313, New Bag at 06/16/20 1313 .  acetaminophen (TYLENOL) tablet 650 mg, 650 mg, Per Tube, Q6H PRN, Agbata, Tochukwu, MD .  albuterol (VENTOLIN HFA) 108 (90 Base) MCG/ACT inhaler 2 puff, 2 puff, Inhalation, Q4H PRN, Agbata, Tochukwu, MD .  enoxaparin (LOVENOX) injection 30 mg, 30 mg, Subcutaneous, Q24H, Agbata, Tochukwu, MD .  morphine 2 MG/ML injection 2-4 mg, 2-4 mg, Intravenous, Q2H PRN, Borders, Vonna Kotyk R, NP .  nicotine (NICODERM CQ - dosed in mg/24 hours) patch 21 mg, 21 mg, Transdermal, Daily, Agbata, Tochukwu, MD, 21 mg at 06/16/20 1311 .   ondansetron (ZOFRAN) tablet 4 mg, 4 mg, Oral, Q6H PRN, 4 mg at 06/16/20 1134 **OR** ondansetron (ZOFRAN) injection 4 mg, 4 mg, Intravenous, Q6H PRN, Agbata, Tochukwu, MD  Current Outpatient Medications:  .  acetaminophen (TYLENOL) 325 MG tablet, Place 2 tablets (650 mg total) into feeding tube every 6 (six) hours as needed for mild pain (or Fever >/= 101)., Disp: , Rfl:  .  azithromycin (ZITHROMAX) 250 MG tablet, Take 2 tablets day 1, then 1 tablet each day (crush the pills mix with water and put down g tube and flush before and after (Patient not taking: No sig reported), Disp: 6 each, Rfl: 0 .  fentaNYL (DURAGESIC) 25 MCG/HR, Place 1 patch onto the skin every 3 (three) days. To help with pain (Patient not taking: No sig reported), Disp: 5 patch, Rfl: 0 .  lidocaine-prilocaine (EMLA) cream, Apply to affected area once (Patient not taking: No sig reported), Disp: 30 g, Rfl: 3 .  Melatonin 1 MG/ML LIQD, Take 2-5 mg by mouth at bedtime as needed. (Patient not taking: No sig reported), Disp: 50 mL, Rfl: 3 .  metoCLOPramide (REGLAN) 5 MG/5ML solution, Take 5 mLs (5 mg  total) by mouth 4 (four) times daily -  before meals and at bedtime. (Patient not taking: Reported on 03/21/2020), Disp: 600 mL, Rfl: 0 .  Morphine Sulfate (MORPHINE CONCENTRATE) 10 MG/0.5ML SOLN concentrated solution, Take 0.25-0.5 mLs (5-10 mg total) by mouth every 4 (four) hours as needed for severe pain., Disp: 30 mL, Rfl: 0 .  nicotine (NICODERM CQ - DOSED IN MG/24 HOURS) 21 mg/24hr patch, One 21mg  patch chest wall daily (okay to substitute generic), Disp: 28 patch, Rfl: 0 .  Nutritional Supplements (FEEDING SUPPLEMENT, OSMOLITE 1.5 CAL,) LIQD, Place 237 mLs into feeding tube 5 (five) times daily., Disp: , Rfl: 0 .  ondansetron (ZOFRAN ODT) 4 MG disintegrating tablet, Take 1 tablet (4 mg total) by mouth every 8 (eight) hours as needed for nausea or vomiting., Disp: 20 tablet, Rfl: 0 .  PROAIR HFA 108 (90 Base) MCG/ACT inhaler, Inhale into  the lungs., Disp: , Rfl:  .  Water For Irrigation, Sterile (FREE WATER) SOLN, Place 100 mLs into feeding tube 5 (five) times daily., Disp: 15000 mL, Rfl: 0  Facility-Administered Medications Ordered in Other Encounters:  .  heparin lock flush 100 unit/mL, 500 Units, Intracatheter, Once PRN, Sindy Guadeloupe, MD .  sodium chloride flush (NS) 0.9 % injection 10 mL, 10 mL, Intravenous, PRN, Sindy Guadeloupe, MD, 10 mL at 05/10/20 9937 .  sodium chloride flush (NS) 0.9 % injection 10 mL, 10 mL, Intracatheter, PRN, Sindy Guadeloupe, MD   Physical exam:  Vitals:   06/16/20 0004 06/16/20 0312 06/16/20 0616 06/16/20 1330  BP:  108/84 114/85 109/79  Pulse: (!) 149 (!) 138 (!) 118 94  Resp: 20 18 20 16   Temp:    98.7 F (37.1 C)  TempSrc:    Oral  SpO2: 100% 100% 97% 97%  Weight:       Physical Exam Constitutional:      Comments: Patient is thin and cachectic.  Appears  fatigued but no acute distress  Eyes:     Extraocular Movements: EOM normal.     Pupils: Pupils are equal, round, and reactive to light.  Cardiovascular:     Rate and Rhythm: Normal rate and regular rhythm.     Heart sounds: Normal heart sounds.  Pulmonary:     Effort: Pulmonary effort is normal.     Breath sounds: Normal breath sounds.  Abdominal:     General: Bowel sounds are normal. There is no distension.     Palpations: Abdomen is soft.     Tenderness: There is no abdominal tenderness. There is no guarding.  Musculoskeletal:     Cervical back: Normal range of motion.  Skin:    General: Skin is warm and dry.  Neurological:     Mental Status: He is alert and oriented to person, place, and time.        CMP Latest Ref Rng & Units 06/16/2020  Glucose 70 - 99 mg/dL 156(H)  BUN 8 - 23 mg/dL 35(H)  Creatinine 0.61 - 1.24 mg/dL 0.83  Sodium 135 - 145 mmol/L 148(H)  Potassium 3.5 - 5.1 mmol/L 4.4  Chloride 98 - 111 mmol/L 110  CO2 22 - 32 mmol/L 23  Calcium 8.9 - 10.3 mg/dL 11.1(H)  Total Protein 6.5 - 8.1 g/dL  9.1(H)  Total Bilirubin 0.3 - 1.2 mg/dL 1.1  Alkaline Phos 38 - 126 U/L 161(H)  AST 15 - 41 U/L 50(H)  ALT 0 - 44 U/L 32   CBC Latest Ref Rng & Units  06/16/2020  WBC 4.0 - 10.5 K/uL 6.7  Hemoglobin 13.0 - 17.0 g/dL 14.2  Hematocrit 39.0 - 52.0 % 45.2  Platelets 150 - 400 K/uL 205    @IMAGES @  DG Lumbar Spine 2-3 Views  Result Date: 06/16/2020 CLINICAL DATA:  Lower lumbar pain, fall EXAM: LUMBAR SPINE - 2-3 VIEW COMPARISON:  CT chest abdomen pelvis, 03/06/2020 FINDINGS: There is no evidence of lumbar spine fracture. Alignment is normal. Mild multilevel disc space height loss and osteophytosis. Nonobstructive pattern of overlying bowel gas with percutaneous gastrostomy tube. IMPRESSION: No acute fracture or dislocation of the lumbar spine. Mild multilevel disc space height loss and osteophytosis. Electronically Signed   By: Eddie Candle M.D.   On: 06/16/2020 11:31   CT Angio Chest PE W/Cm &/Or Wo Cm  Result Date: 06/16/2020 CLINICAL DATA:  Suspected pulmonary embolism, status post fall in this 65 year old male with history of esophageal cancer EXAM: CT ANGIOGRAPHY CHEST WITH CONTRAST TECHNIQUE: Multidetector CT imaging of the chest was performed using the standard protocol during bolus administration of intravenous contrast. Multiplanar CT image reconstructions and MIPs were obtained to evaluate the vascular anatomy. CONTRAST:  38mL OMNIPAQUE IOHEXOL 350 MG/ML SOLN COMPARISON:  CT of the chest from 03/06/2020, PET exam from March 23, 2020 FINDINGS: Cardiovascular: No sign of pulmonary embolism. Aorta is of normal caliber, not well evaluated due to phase of contrast enhancement. RIGHT-sided Port-A-Cath in place. Displaced anteriorly now due to enlarging thoracic inlet adenopathy. Catheter terminates in the distal superior vena cava near the caval to atrial junction. Position difficult to determine due to dense contrast in the SVC. Heart size is normal without pericardial effusion  Mediastinum/Nodes: Enlarging RIGHT thoracic inlet lymphadenopathy displaces the catheter in the RIGHT neck anteriorly. Masslike area measuring approximately 8.5 x 5.6 cm with areas of nodal disease now confluent. To approximately 2.5 cm lymph nodes were noted in this location on the prior study. Esophagus markedly patulous. Signs of distal esophageal thickening with similar appearance. Lymph nodes adjacent to the EG junction are similar to the prior study. No axillary lymphadenopathy. RIGHT hilar nodal enlargement though mild is suspicious given other findings at 11 mm (image 38, series 4). Lungs/Pleura: Areas of RIGHT upper lobe airspace disease along the major fissure. Patchy nodular airspace disease and tree-in-bud opacities in the lung bases with some areas of filling defect in the bronchi subtending these areas. Upper Abdomen: Moderately large volume of pneumoperitoneum in the upper abdomen. G-tube in place. By report placed in October of 2021. Incomplete mixing of contrast in the abdominal aorta with suspected streaming artifact in the SMA anteriorly extending from areas of unopacified blood in the anterior aorta. Musculoskeletal: No acute musculoskeletal finding. Review of the MIP images confirms the above findings. IMPRESSION: 1. No evidence of pulmonary embolism. 2. Moderately large volume of pneumoperitoneum in the absence of recent abdominal intervention is suspicious for hollow viscus perforation. Suggest CT of the abdomen and pelvis with intravenous and oral contrast for further assessment. 3. What is almost certainly streaming artifact extending into the SMA from unopacified blood in the aorta, attention to this area on upcoming CT of the abdomen to exclude the possibility of thrombus in this location given unexplained pneumoperitoneum. 4. Signs of suspected aspiration with nodular changes at the lung bases. Suggest follow-up imaging for further assessment to ensure resolution. 5. Marked worsening of  nodal disease at the RIGHT thoracic inlet in particular. 6. Aortic atherosclerosis. Critical Value/emergent results were called by telephone at the time of interpretation on  06/16/2020 at 12:10 pm to provider Valora Piccolo , who verbally acknowledged these results. Aortic Atherosclerosis (ICD10-I70.0). Electronically Signed   By: Zetta Bills M.D.   On: 06/16/2020 12:12   MR Cervical Spine W Wo Contrast  Result Date: 05/31/2020 CLINICAL DATA:  History of stage IV esophageal cancer. Cervical spine and right shoulder pain. EXAM: MRI CERVICAL SPINE WITHOUT AND WITH CONTRAST TECHNIQUE: Multiplanar and multiecho pulse sequences of the cervical spine, to include the craniocervical junction and cervicothoracic junction, were obtained without and with intravenous contrast. CONTRAST:  49mL GADAVIST GADOBUTROL 1 MMOL/ML IV SOLN COMPARISON:  PET-CT 03/23/2020 FINDINGS: Alignment: Normal alignment. Vertebrae: No bone lesions are identified.  No fractures. Cord: Normal cord signal intensity. No cord lesions or syrinx. No abnormal cord enhancement. Posterior Fossa, vertebral arteries, paraspinal tissues: Large right supraclavicular mass. The medial component appears moderately necrotic. This is likely metastatic adenopathy. They could be involvement of the brachial plexus. Disc levels: No disc protrusions, significant spinal or foraminal stenosis. Mild bulging discs at C5-6 and C6-7. IMPRESSION: 1. Large right supraclavicular mass likely metastatic adenopathy. Possible involvement of the medial brachial plexus. 2. No disc protrusions, significant spinal or foraminal stenosis. 3. No bone lesions to suggest osseous metastatic disease. Electronically Signed   By: Marijo Sanes M.D.   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 Failure to thrive. Generalized weakness. Patient also fell. Dizziness. EXAM: CT ABDOMEN AND PELVIS WITH CONTRAST TECHNIQUE: Multidetector CT imaging of the abdomen and pelvis was performed using the standard protocol following bolus administration of intravenous contrast. CONTRAST:  58mL OMNIPAQUE IOHEXOL 300 MG/ML  SOLN COMPARISON:  03/06/2020 FINDINGS: Lower chest: Small areas of reticulonodular opacity in the dependent lower lobes, greater on the left. Remainder of the lung bases are clear. No pleural effusion. Hepatobiliary: No focal liver abnormality is seen. No gallstones, gallbladder wall thickening, or biliary dilatation. Pancreas: Dilated common bile duct to 3-4 mm, increased from the prior study. No pancreatic mass or inflammation. Spleen: Normal in size without focal abnormality. Adrenals/Urinary Tract: Adrenal glands are unremarkable. Kidneys are normal, without renal calculi, focal lesion, or hydronephrosis. Bladder is unremarkable. Stomach/Bowel: There is pneumatosis intestinalis along the cecum and ascending colon as well as evidence a bowel perforation with a small amount of free intraperitoneal air. Free air collects anterior to the liver, with multiple additional bubbles of free air that are most evident in the right side of the abdomen adjacent to the abnormal right colon. Remainder of the colon is unremarkable. Normal small bowel. Normal appendix visualized. Gastrostomy tube in the mid stomach appears well positioned. Stomach otherwise unremarkable. Vascular/Lymphatic: Mild aortic atherosclerosis. No aneurysm. No enlarged lymph nodes. Reproductive: Prominent but stable appearing prostate gland. Other: No abdominal wall hernia or abnormality. No abdominopelvic ascites. Musculoskeletal: No fracture or acute finding.  No bone lesion. IMPRESSION: 1. Small amount of free intraperitoneal air that appears to arisen from the right colon. Cecum and ascending colon demonstrates pneumatosis  intestinalis. Exact site bowel perforation is not clearly defined, however. 2. No other acute abnormality within abdomen or pelvis. 3. Small amount reticulonodular opacities posterior lower lobes, greater on the left, which may reflect atelectasis. Infection and lymphangitic spread of carcinoma both in the differential diagnosis. 4. Mild aortic atherosclerosis. Electronically Signed   By: Dedra Skeens.D.  On: 06/16/2020 14:18   MR SHOULDER RIGHT W WO CONTRAST  Result Date: 05/31/2020 CLINICAL DATA:  Right shoulder pain.  Metastatic esophageal cancer. EXAM: MRI OF THE RIGHT SHOULDER WITHOUT AND WITH CONTRAST TECHNIQUE: Multiplanar, multisequence MR imaging of the right shoulder was performed before and after the administration of intravenous contrast. CONTRAST:  85mL GADAVIST GADOBUTROL 1 MMOL/ML IV SOLN COMPARISON:  PET-CT 03/23/2020 and MRI cervical spine 05/31/2020 FINDINGS: Rotator cuff: Moderate rotator cuff tendinopathy/tendinosis. No full-thickness retracted rotator cuff tear. Muscles: Moderate edema like signal changes involving the subscapularis muscle. Nonspecific myositis or possible muscle tear. Denervation edema is also possibility given the large right supraclavicular mass and possible brachial plexus involvement. Biceps long head:  Intact Acromioclavicular Joint: Mild degenerative changes. Type 2 acromion. No lateral downsloping or undersurface spurring. Glenohumeral Joint: Mild degenerative chondrosis. No joint effusion or synovitis. Labrum:  No definite labral tears. Bones: No worrisome bone lesions or areas of abnormal bone enhancement to suggest metastatic disease. Other: No subacromial/subdeltoid fluid collections to suggest bursitis. IMPRESSION: 1. Moderate rotator cuff tendinopathy/tendinosis. No full-thickness retracted rotator cuff tear. 2. Moderate edema like signal changes involving the subscapularis muscle. Nonspecific myositis or possible muscle tear. Denervation edema is also  possibility given the large right supraclavicular mass and possible medial brachial plexus involvement. 3. Intact long head biceps tendon and glenoid labrum. 4. No significant findings to suggest bony metastatic disease. Electronically Signed   By: Marijo Sanes M.D.   On: 05/31/2020 16:50    Assessment and plan- Patient is a 65 y.o. male with stage IV metastatic esophageal carcinoma with last dose of FOLFOX Opdivo treatment on 06/07/2020 presenting with failure to thrive and fall at home  CT chest abdomen and pelvis shows free intraperitoneal air possibly secondary to right colonic perforation.  However patient is in no acute distress.  He has normal bowel sounds and has no abdominal distention.  I have discussed with patient and his family including his sister and his niece Andrew Pratt that bowel perforation typically portends poor prognosis apically hours to days if not surgically corrected.  Patient has underlying stage IV esophageal cancer and his overall prognosis is poor.  He is not likely to survive an open exploratory laparotomy given his poor nutritional status.  He also tested positive for COVID during his present hospitalization.  Patient has always been a man of few words but he understood everything I said and did not wish to proceed with surgery if it comes to it.  He is a DNR DNI.  Patient's family is in agreement with this plan as well  Patient understands that going back to home is also not an option at this time given his declining performance status.  He is agreeable to proceeding with best supportive care/hospice and transitioning to long-term facility with hospice  Patient is dependent on PEG tube for his feeds and I would recommend keeping him n.p.o. for now with a repeat abdominal x-ray tomorrow morning.  If the free intraperitoneal air appears to be resolving, it would be reasonable to offer him tube feeds for comfort.  Please have hospice on board  Will continue to  follow    Visit Diagnosis 1. Fall in home, initial encounter   2. Generalized weakness   3. Syncope, unspecified syncope type   4. Elevated troponin   5. Back pain   6. Fall at home, initial encounter     Dr. Randa Evens, MD, MPH [facility] [id number] 06/16/2020  4:13 PM

## 2020-06-16 NOTE — H&P (Addendum)
History and Physical    Andrew Pratt:[id number] DOB: 08-07-1955 DOA: 06/16/2020  PCP: Sindy Guadeloupe, MD   Patient coming from: Home  I have personally briefly reviewed patient's old medical records in [place]  Chief Complaint: Fall  HPI: Andrew Pratt is a 65 y.o. 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  Patient states that he fell and denies feeling dizzy or lightheaded.  He is unsure if he tripped on something but he complains of feeling very weak.  He is noted to be unsteady on his feet. He denies feeling dizzy or lightheaded, denies having any nausea, no vomiting, no diarrhea, no urinary frequency, no dysuria, no nocturia, no chest pain, no shortness of breath, no headache, no blurred vision, no abdominal pain. 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  It is unclear the exact mechanism of the fall but patient denies feeling dizzy or lightheaded.  He appears to be dehydrated and has elevated calcium levels.  Patient will be admitted to the hospital and will require physical therapy evaluation and possible nursing home placement.  Review of Systems: As per HPI otherwise all  systems reviewed and negative.    Past Medical History:  Diagnosis Date  . esophageal cancer     Past Surgical History:  Procedure Laterality Date  . ESOPHAGOGASTRODUODENOSCOPY (EGD) WITH PROPOFOL N/A 03/02/2020   Procedure: ESOPHAGOGASTRODUODENOSCOPY (EGD) WITH PROPOFOL;  Surgeon: Lin Landsman, MD;  Location: [place] [facility];  Service: Gastroenterology;  Laterality: N/A;  . GASTROSTOMY TUBE PLACEMENT  03/08/2020  . MASS BIOPSY N/A 03/08/2020   Procedure: NECK MASS BIOPSY;  Surgeon: Jules Husbands, MD;  Location: [facility];  Service: General;  Laterality: N/A;  . PORTACATH PLACEMENT N/A 03/08/2020   Procedure: INSERTION PORT-A-CATH;  Surgeon: Jules Husbands, MD;  Location: [facility];  Service: General;  Laterality: N/A;     reports that he has quit smoking. His smoking use included cigarettes. He smoked 0.50 packs per day. He has never used smokeless tobacco. He reports previous alcohol use. He reports previous drug use.  No Known Allergies  Family History  Problem Relation Age of Onset  . Cancer Mother        lung     Prior to Admission medications   Medication Sig Start Date End Date Taking? Authorizing Provider  acetaminophen (TYLENOL) 325 MG tablet Place 2 tablets (650 mg total) into feeding tube every 6 (six) hours as needed for mild pain (or Fever >/= 101). 03/12/20   Loletha Grayer, MD  azithromycin (ZITHROMAX) 250 MG tablet Take 2 tablets day 1, then 1 tablet each day (crush the pills mix with water and put down g tube and flush before and after Patient not taking: No sig reported  05/10/20   Sindy Guadeloupe, MD  fentaNYL (DURAGESIC) 25 MCG/HR Place 1 patch onto the skin every 3 (three) days. To help with pain Patient not taking: No sig reported 03/21/20   Fulp, Cammie, MD  lidocaine-prilocaine (EMLA) cream Apply to affected area once Patient not taking: No sig reported 03/22/20   Sindy Guadeloupe, MD  Melatonin 1 MG/ML LIQD Take 2-5 mg by mouth at bedtime as  needed. Patient not taking: No sig reported 05/10/20   Borders, Kirt Boys, NP  metoCLOPramide (REGLAN) 5 MG/5ML solution Take 5 mLs (5 mg total) by mouth 4 (four) times daily -  before meals and at bedtime. Patient not taking: Reported on 03/21/2020 03/12/20 04/11/20  Loletha Grayer, MD  Morphine Sulfate (MORPHINE CONCENTRATE) 10 MG/0.5ML SOLN concentrated solution Take 0.25-0.5 mLs (5-10 mg total) by mouth every 4 (four) hours as needed for severe pain. 06/07/20   Borders, Kirt Boys, NP  nicotine (NICODERM CQ - DOSED IN MG/24 HOURS) 21 mg/24hr patch One 21mg  patch chest wall daily (okay to substitute generic) 03/12/20   Loletha Grayer, MD  Nutritional Supplements (FEEDING SUPPLEMENT, OSMOLITE 1.5 CAL,) LIQD Place 237 mLs into feeding tube 5 (five) times daily. 03/12/20   Loletha Grayer, MD  ondansetron (ZOFRAN ODT) 4 MG disintegrating tablet Take 1 tablet (4 mg total) by mouth every 8 (eight) hours as needed for nausea or vomiting. 03/12/20   Loletha Grayer, MD  PROAIR HFA 108 [phone] Base) MCG/ACT inhaler Inhale into the lungs. 04/12/20   [provider]  Water For Irrigation, Sterile (FREE WATER) SOLN Place 100 mLs into feeding tube 5 (five) times daily. 03/12/20   Loletha Grayer, MD    Physical Exam: Vitals:   06/16/20 0002 06/16/20 0004 06/16/20 0312 06/16/20 0616  BP:   108/84 114/85  Pulse:  (!) 149 (!) 138 (!) 118  Resp:  20 18 20   Temp:      TempSrc:      SpO2:  100% 100% 97%  Weight: 40 kg        Vitals:   06/16/20 0002 06/16/20 0004 06/16/20 0312 06/16/20 0616  BP:   108/84 114/85  Pulse:  (!) 149 (!) 138 (!) 118  Resp:  20 18 20   Temp:      TempSrc:      SpO2:  100% 100% 97%  Weight: 40 kg       Constitutional: NAD, alert and oriented x 3.  Frail and chronically ill-appearing Eyes: PERRL, lids and conjunctivae pallor ENMT: Mucous membranes are dry.  Neck: normal, supple, no masses, no thyromegaly Respiratory: clear to auscultation bilaterally, no  wheezing, no crackles. Normal respiratory effort. No accessory muscle use.  Cardiovascular: Tachycardia, no murmurs / rubs / gallops. No extremity edema. 2+ pedal pulses. No carotid bruits.  Abdomen: no tenderness, no masses palpated. No hepatosplenomegaly. Bowel sounds positive.  PEG tube in place Musculoskeletal: no clubbing / cyanosis. No joint deformity upper and lower extremities.  Skin: no rashes, lesions, ulcers.  Neurologic: No gross focal neurologic deficit.  Generalized weakness Psychiatric: Normal mood and affect.   Labs on Admission: I have personally reviewed following labs and imaging studies  CBC: Recent Labs  Lab 06/16/20 0048  WBC 6.7  HGB 14.2  HCT 45.2  MCV 94.0  PLT [id number]   Basic Metabolic Panel: Recent Labs  Lab 06/16/20 0048  NA 148*  K 4.4  CL 110  CO2 23  GLUCOSE 156*  BUN 35*  CREATININE 0.83  CALCIUM 11.1*  GFR: Estimated Creatinine Clearance: 50.9 mL/min (by C-G formula based on SCr of 0.83 mg/dL). Liver Function Tests: Recent Labs  Lab 06/16/20 0048  AST 50*  ALT 32  ALKPHOS 161*  BILITOT 1.1  PROT 9.1*  ALBUMIN 3.6   No results for input(s): LIPASE, AMYLASE in the last 168 hours. No results for input(s): AMMONIA in the last 168 hours. Coagulation Profile: No results for input(s): INR, PROTIME in the last 168 hours. Cardiac Enzymes: No results for input(s): CKTOTAL, CKMB, CKMBINDEX, TROPONINI in the last 168 hours. BNP (last 3 results) No results for input(s): PROBNP in the last 8760 hours. HbA1C: No results for input(s): HGBA1C in the last 72 hours. CBG: No results for input(s): GLUCAP in the last 168 hours. Lipid Profile: No results for input(s): CHOL, HDL, LDLCALC, TRIG, CHOLHDL, LDLDIRECT in the last 72 hours. Thyroid Function Tests: No results for input(s): TSH, T4TOTAL, FREET4, T3FREE, THYROIDAB in the last 72 hours. Anemia Panel: No results for input(s): VITAMINB12, FOLATE, FERRITIN, TIBC, IRON, RETICCTPCT in the last  72 hours. Urine analysis:    Component Value Date/Time   COLORURINE AMBER (A) 05/07/2020 0828   APPEARANCEUR HAZY (A) 05/07/2020 0828   LABSPEC 1.027 05/07/2020 0828   PHURINE 5.0 05/07/2020 0828   GLUCOSEU NEGATIVE 05/07/2020 0828   HGBUR NEGATIVE 05/07/2020 0828   BILIRUBINUR NEGATIVE 05/07/2020 0828   KETONESUR 5 (A) 05/07/2020 0828   PROTEINUR 30 (A) 05/07/2020 0828   NITRITE NEGATIVE 05/07/2020 0828   LEUKOCYTESUR NEGATIVE 05/07/2020 0828    Radiological Exams on Admission: No results found.  EKG: Independently reviewed.  Sinus tachycardia  Assessment/Plan Principal Problem:   Generalized weakness Active Problems:   Tobacco abuse   Esophageal obstruction   Primary cancer of esophagus with metastasis to other site [facility])   Protein-calorie malnutrition, severe   Hypercalcemia   Hypernatremia   Fall at home, initial encounter     Generalized weakness with fall Patient appears extremely deconditioned related to his underlying chronic illness He complains of generalized weakness.  Status post fall Patient is a increased risk for recurrent falls with fracture that would worsen his debility Will place patient on fall precautions Consult physical therapy    Stage IV squamous cell carcinoma of the esophagus with metastatic disease. Follow-up with oncology for palliative chemoradiation therapy    Protein calorie malnutrition (Severe) As evidenced by prominent bones which include clavicle, temporal bones etc Patient has a BMI of 16 Patient is unable to take p.o. due to esophageal obstruction from cancer Continue Osmolite 1 can 5 times daily    Dehydration Secondary to inadequate free water administration We will hydrate patient with normal saline    Hypercalcemia Most likely related to bony metastatic disease    Nicotine dependence Smoking cessation has been discussed with patient in detail We will place patient on nicotine transdermal patch 21 mg  daily    Tachycardia Concerns for possible pulmonary embolism the patient with known malignancy May also be secondary to pain as well as dehydration CT angiogram of the chest is negative for a PE but shows moderately large volume of pneumoperitoneum in the absence of recent abdominal intervention is suspicious for hollow viscus perforation. Suggest CT of the abdomen and pelvis with intravenous and oral contrast for further assessment. Surgery consult has been requested. Awaiting recommendations  DVT prophylaxis: Lovenox Code Status: DNR Family Communication: Greater than 50% of time was spent discussing plan of care with patient at the bedside.  All questions and concerns have  been addressed.  He verbalizes understanding and agrees with plan. Disposition Plan: SNF Consults called: Oncology, physical therapy    Vonita Calloway MD [facility]     06/16/2020, 11:05 AM

## 2020-06-16 NOTE — Telephone Encounter (Signed)
Patient seen in the ER 

## 2020-06-16 NOTE — ED Notes (Signed)
This RN updated pt's niece with pt's permission.

## 2020-06-16 NOTE — Consult Note (Addendum)
[place]  Telephone:([phone] [phone]   Name: Andrew Pratt Date: 06/16/2020 MRN: [id number]  DOB: 04-10-1956  Patient Care Team: Sindy Guadeloupe, MD as PCP - General (Oncology) Clent Jacks, RN as Oncology Nurse Navigator Sindy Guadeloupe, MD as Consulting Physician (Hematology and Oncology)    REASON FOR CONSULTATION: Andrew Pratt is a 65 y.o. 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  Patient is status post PEG tube placement.  He has had significant symptom burden with dysphagia, difficulty managing oral secretions, progressive weight loss, and pain.  Patient was admitted to the hospital 06/16/2020 with weakness after falling at home.   Patient was found to have mild hypercalcemia likely secondary to dehydration.  He was found to be COVID-positive.  Additionally, CT of the chest/abdomen/pelvis revealed a pneumoperitoneum with possible perforation of the right colon, possible aspiration pneumonia, and marked worsening nodal disease in the right thorax.  Palliative care was consulted help address goals and manage ongoing symptoms..   SOCIAL HISTORY:     reports that he has quit smoking. His smoking use included cigarettes. He smoked 0.50 packs per day. He has never used smokeless tobacco. He reports previous alcohol use. He reports previous drug use.    Patient was never married has no children.  He lives with a nephew and nephew's wife.  He has 12 brothers and sisters.  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  ADVANCE DIRECTIVES:  Does not have  CODE STATUS: DNR  PAST MEDICAL HISTORY: Past Medical History:  Diagnosis Date  . esophageal cancer     PAST SURGICAL HISTORY:  Past Surgical History:  Procedure Laterality Date  .  ESOPHAGOGASTRODUODENOSCOPY (EGD) WITH PROPOFOL N/A 03/02/2020   Procedure: ESOPHAGOGASTRODUODENOSCOPY (EGD) WITH PROPOFOL;  Surgeon: Lin Landsman, MD;  Location: [facility];  Service: Gastroenterology;  Laterality: N/A;  . GASTROSTOMY TUBE PLACEMENT  03/08/2020  . MASS BIOPSY N/A 03/08/2020   Procedure: NECK MASS BIOPSY;  Surgeon: Jules Husbands, MD;  Location: [facility];  Service: General;  Laterality: N/A;  . PORTACATH PLACEMENT N/A 03/08/2020   Procedure: INSERTION PORT-A-CATH;  Surgeon: Jules Husbands, MD;  Location: [facility];  Service: General;  Laterality: N/A;    HEMATOLOGY/ONCOLOGY HISTORY:  Oncology History  Primary cancer of esophagus with metastasis to other site ([place])  03/07/2020 Initial Diagnosis   Primary cancer of esophagus with metastasis to other site [facility])   03/22/2020 -  Chemotherapy    Patient is on Treatment Plan: GASTROESOPHAGEAL FOLFOX D1,15 + NIVOLUMAB Q28D        ALLERGIES:  has No Known Allergies.  MEDICATIONS:  Current Facility-Administered Medications  Medication Dose Route Frequency Provider Last Rate Last Admin  . 0.9 %  sodium chloride infusion   Intravenous Continuous Agbata, Tochukwu, MD 100 mL/hr at 06/16/20 1313 New Bag at 06/16/20 1313  . acetaminophen (TYLENOL) tablet 650 mg  650 mg Per Tube Q6H PRN Agbata, Tochukwu, MD      . albuterol (VENTOLIN HFA) 108 (90 Base) MCG/ACT inhaler 2 puff  2 puff Inhalation Q4H PRN Agbata, Tochukwu, MD      . enoxaparin (LOVENOX) injection 30 mg  30 mg Subcutaneous Q24H Agbata, Tochukwu, MD      . feeding supplement (OSMOLITE 1.5 CAL) liquid 237 mL  237 mL Per Tube 5  X Daily Agbata, Tochukwu, MD   237 mL at 06/16/20 1136  . free water 100 mL  100 mL Per Tube 5 X Daily Agbata, Tochukwu, MD   100 mL at 06/16/20 1136  . morphine 2 MG/ML injection 2 mg  2 mg Intravenous Q4H PRN Agbata, Tochukwu, MD      . nicotine (NICODERM CQ - dosed in mg/24 hours) patch 21 mg  21 mg Transdermal Daily Agbata, Tochukwu, MD    21 mg at 06/16/20 1311  . ondansetron (ZOFRAN) tablet 4 mg  4 mg Oral Q6H PRN Agbata, Tochukwu, MD   4 mg at 06/16/20 1134   Or  . ondansetron (ZOFRAN) injection 4 mg  4 mg Intravenous Q6H PRN Agbata, Tochukwu, MD       Current Outpatient Medications  Medication Sig Dispense Refill  . acetaminophen (TYLENOL) 325 MG tablet Place 2 tablets (650 mg total) into feeding tube every 6 (six) hours as needed for mild pain (or Fever >/= 101).    Marland Kitchen azithromycin (ZITHROMAX) 250 MG tablet Take 2 tablets day 1, then 1 tablet each day (crush the pills mix with water and put down g tube and flush before and after (Patient not taking: No sig reported) 6 each 0  . fentaNYL (DURAGESIC) 25 MCG/HR Place 1 patch onto the skin every 3 (three) days. To help with pain (Patient not taking: No sig reported) 5 patch 0  . lidocaine-prilocaine (EMLA) cream Apply to affected area once (Patient not taking: No sig reported) 30 g 3  . Melatonin 1 MG/ML LIQD Take 2-5 mg by mouth at bedtime as needed. (Patient not taking: No sig reported) 50 mL 3  . metoCLOPramide (REGLAN) 5 MG/5ML solution Take 5 mLs (5 mg total) by mouth 4 (four) times daily -  before meals and at bedtime. (Patient not taking: Reported on 03/21/2020) 600 mL 0  . Morphine Sulfate (MORPHINE CONCENTRATE) 10 MG/0.5ML SOLN concentrated solution Take 0.25-0.5 mLs (5-10 mg total) by mouth every 4 (four) hours as needed for severe pain. 30 mL 0  . nicotine (NICODERM CQ - DOSED IN MG/24 HOURS) 21 mg/24hr patch One 21mg  patch chest wall daily (okay to substitute generic) 28 patch 0  . Nutritional Supplements (FEEDING SUPPLEMENT, OSMOLITE 1.5 CAL,) LIQD Place 237 mLs into feeding tube 5 (five) times daily.  0  . ondansetron (ZOFRAN ODT) 4 MG disintegrating tablet Take 1 tablet (4 mg total) by mouth every 8 (eight) hours as needed for nausea or vomiting. 20 tablet 0  . PROAIR HFA 108 (90 Base) MCG/ACT inhaler Inhale into the lungs.    . Water For Irrigation, Sterile (FREE  WATER) SOLN Place 100 mLs into feeding tube 5 (five) times daily. 15000 mL 0   Facility-Administered Medications Ordered in Other Encounters  Medication Dose Route Frequency Provider Last Rate Last Admin  . heparin lock flush 100 unit/mL  500 Units Intracatheter Once PRN Sindy Guadeloupe, MD      . sodium chloride flush (NS) 0.9 % injection 10 mL  10 mL Intravenous PRN Sindy Guadeloupe, MD   10 mL at 05/10/20 0833  . sodium chloride flush (NS) 0.9 % injection 10 mL  10 mL Intracatheter PRN Sindy Guadeloupe, MD        VITAL SIGNS: BP 109/79 (BP Location: Left Arm)   Pulse 94   Temp 98.7 F (37.1 C) (Oral)   Resp 16   Wt 88 lb 2.9 oz (40 kg)   SpO2 97%  BMI 16.66 kg/m  Filed Weights   06/16/20 0002  Weight: 88 lb 2.9 oz (40 kg)    Estimated body mass index is 16.66 kg/m as calculated from the following:   Height as of 05/06/20: 5\' 1"  (1.549 m).   Weight as of this encounter: 88 lb 2.9 oz (40 kg).  LABS: CBC:    Component Value Date/Time   WBC 6.7 06/16/2020 0048   HGB 14.2 06/16/2020 0048   HGB 10.6 (L) 02/15/2020 1553   HCT 45.2 06/16/2020 0048   HCT 33.5 (L) 02/15/2020 1553   PLT 205 06/16/2020 0048   PLT 611 (H) 02/15/2020 1553   MCV 94.0 06/16/2020 0048   MCV 78 (L) 02/15/2020 1553   NEUTROABS 23.4 (H) 06/07/2020 0902   LYMPHSABS 0.9 06/07/2020 0902   MONOABS 2.7 (H) 06/07/2020 0902   EOSABS 0.0 06/07/2020 0902   BASOSABS 0.0 06/07/2020 0902   Comprehensive Metabolic Panel:    Component Value Date/Time   NA 148 (H) 06/16/2020 0048   NA 140 02/15/2020 1553   K 4.4 06/16/2020 0048   CL 110 06/16/2020 0048   CO2 23 06/16/2020 0048   BUN 35 (H) 06/16/2020 0048   BUN 5 (L) 02/15/2020 1553   CREATININE 0.83 06/16/2020 0048   GLUCOSE 156 (H) 06/16/2020 0048   CALCIUM 11.1 (H) 06/16/2020 0048   AST 50 (H) 06/16/2020 0048   ALT 32 06/16/2020 0048   ALKPHOS 161 (H) 06/16/2020 0048   BILITOT 1.1 06/16/2020 0048   BILITOT <0.2 02/15/2020 1553   PROT 9.1 (H)  06/16/2020 0048   PROT 6.8 02/15/2020 1553   ALBUMIN 3.6 06/16/2020 0048   ALBUMIN 3.8 02/15/2020 1553    RADIOGRAPHIC STUDIES: DG Lumbar Spine 2-3 Views  Result Date: 06/16/2020 CLINICAL DATA:  Lower lumbar pain, fall EXAM: LUMBAR SPINE - 2-3 VIEW COMPARISON:  CT chest abdomen pelvis, 03/06/2020 FINDINGS: There is no evidence of lumbar spine fracture. Alignment is normal. Mild multilevel disc space height loss and osteophytosis. Nonobstructive pattern of overlying bowel gas with percutaneous gastrostomy tube. IMPRESSION: No acute fracture or dislocation of the lumbar spine. Mild multilevel disc space height loss and osteophytosis. Electronically Signed   By: Eddie Candle M.D.   On: 06/16/2020 11:31   CT Angio Chest PE W/Cm &/Or Wo Cm  Result Date: 06/16/2020 CLINICAL DATA:  Suspected pulmonary embolism, status post fall in this 65 year old male with history of esophageal cancer EXAM: CT ANGIOGRAPHY CHEST WITH CONTRAST TECHNIQUE: Multidetector CT imaging of the chest was performed using the standard protocol during bolus administration of intravenous contrast. Multiplanar CT image reconstructions and MIPs were obtained to evaluate the vascular anatomy. CONTRAST:  25mL OMNIPAQUE IOHEXOL 350 MG/ML SOLN COMPARISON:  CT of the chest from 03/06/2020, PET exam from March 23, 2020 FINDINGS: Cardiovascular: No sign of pulmonary embolism. Aorta is of normal caliber, not well evaluated due to phase of contrast enhancement. RIGHT-sided Port-A-Cath in place. Displaced anteriorly now due to enlarging thoracic inlet adenopathy. Catheter terminates in the distal superior vena cava near the caval to atrial junction. Position difficult to determine due to dense contrast in the SVC. Heart size is normal without pericardial effusion Mediastinum/Nodes: Enlarging RIGHT thoracic inlet lymphadenopathy displaces the catheter in the RIGHT neck anteriorly. Masslike area measuring approximately 8.5 x 5.6 cm with areas of nodal  disease now confluent. To approximately 2.5 cm lymph nodes were noted in this location on the prior study. Esophagus markedly patulous. Signs of distal esophageal thickening with similar appearance. Lymph  nodes adjacent to the EG junction are similar to the prior study. No axillary lymphadenopathy. RIGHT hilar nodal enlargement though mild is suspicious given other findings at 11 mm (image 38, series 4). Lungs/Pleura: Areas of RIGHT upper lobe airspace disease along the major fissure. Patchy nodular airspace disease and tree-in-bud opacities in the lung bases with some areas of filling defect in the bronchi subtending these areas. Upper Abdomen: Moderately large volume of pneumoperitoneum in the upper abdomen. G-tube in place. By report placed in October of 2021. Incomplete mixing of contrast in the abdominal aorta with suspected streaming artifact in the SMA anteriorly extending from areas of unopacified blood in the anterior aorta. Musculoskeletal: No acute musculoskeletal finding. Review of the MIP images confirms the above findings. IMPRESSION: 1. No evidence of pulmonary embolism. 2. Moderately large volume of pneumoperitoneum in the absence of recent abdominal intervention is suspicious for hollow viscus perforation. Suggest CT of the abdomen and pelvis with intravenous and oral contrast for further assessment. 3. What is almost certainly streaming artifact extending into the SMA from unopacified blood in the aorta, attention to this area on upcoming CT of the abdomen to exclude the possibility of thrombus in this location given unexplained pneumoperitoneum. 4. Signs of suspected aspiration with nodular changes at the lung bases. Suggest follow-up imaging for further assessment to ensure resolution. 5. Marked worsening of nodal disease at the RIGHT thoracic inlet in particular. 6. Aortic atherosclerosis. Critical Value/emergent results were called by telephone at the time of interpretation on 06/16/2020 at  12:10 pm to provider [facility] , who verbally acknowledged these results. Aortic Atherosclerosis (ICD10-I70.0). Electronically Signed   By: Zetta Bills M.D.   On: 06/16/2020 12:12   MR Cervical Spine W Wo Contrast  Result Date: 05/31/2020 CLINICAL DATA:  History of stage IV esophageal cancer. Cervical spine and right shoulder pain. EXAM: MRI CERVICAL SPINE WITHOUT AND WITH CONTRAST TECHNIQUE: Multiplanar and multiecho pulse sequences of the cervical spine, to include the craniocervical junction and cervicothoracic junction, were obtained without and with intravenous contrast. CONTRAST:  60mL GADAVIST GADOBUTROL 1 MMOL/ML IV SOLN COMPARISON:  PET-CT 03/23/2020 FINDINGS: Alignment: Normal alignment. Vertebrae: No bone lesions are identified.  No fractures. Cord: Normal cord signal intensity. No cord lesions or syrinx. No abnormal cord enhancement. Posterior Fossa, vertebral arteries, paraspinal tissues: Large right supraclavicular mass. The medial component appears moderately necrotic. This is likely metastatic adenopathy. They could be involvement of the brachial plexus. Disc levels: No disc protrusions, significant spinal or foraminal stenosis. Mild bulging discs at C5-6 and C6-7. IMPRESSION: 1. Large right supraclavicular mass likely metastatic adenopathy. Possible involvement of the medial brachial plexus. 2. No disc protrusions, significant spinal or foraminal stenosis. 3. No bone lesions to suggest osseous metastatic disease. Electronically Signed   By: Marijo Sanes M.D.   On: 05/31/2020 16:46   CT Abdomen Pelvis W Contrast  Result Date: 06/16/2020 CLINICAL DATA:  History of stage IV squamous cell carcinoma of the esophagus on palliative chemotherapy and radiation therapy with metastatic disease to the right shoulder. Failure to thrive. Generalized weakness. Patient also fell. Dizziness. EXAM: CT ABDOMEN AND PELVIS WITH CONTRAST TECHNIQUE: Multidetector CT imaging of the abdomen and pelvis was  performed using the standard protocol following bolus administration of intravenous contrast. CONTRAST:  26mL OMNIPAQUE IOHEXOL 300 MG/ML  SOLN COMPARISON:  03/06/2020 FINDINGS: Lower chest: Small areas of reticulonodular opacity in the dependent lower lobes, greater on the left. Remainder of the lung bases are clear. No pleural effusion. Hepatobiliary:  No focal liver abnormality is seen. No gallstones, gallbladder wall thickening, or biliary dilatation. Pancreas: Dilated common bile duct to 3-4 mm, increased from the prior study. No pancreatic mass or inflammation. Spleen: Normal in size without focal abnormality. Adrenals/Urinary Tract: Adrenal glands are unremarkable. Kidneys are normal, without renal calculi, focal lesion, or hydronephrosis. Bladder is unremarkable. Stomach/Bowel: There is pneumatosis intestinalis along the cecum and ascending colon as well as evidence a bowel perforation with a small amount of free intraperitoneal air. Free air collects anterior to the liver, with multiple additional bubbles of free air that are most evident in the right side of the abdomen adjacent to the abnormal right colon. Remainder of the colon is unremarkable. Normal small bowel. Normal appendix visualized. Gastrostomy tube in the mid stomach appears well positioned. Stomach otherwise unremarkable. Vascular/Lymphatic: Mild aortic atherosclerosis. No aneurysm. No enlarged lymph nodes. Reproductive: Prominent but stable appearing prostate gland. Other: No abdominal wall hernia or abnormality. No abdominopelvic ascites. Musculoskeletal: No fracture or acute finding.  No bone lesion. IMPRESSION: 1. Small amount of free intraperitoneal air that appears to arisen from the right colon. Cecum and ascending colon demonstrates pneumatosis intestinalis. Exact site bowel perforation is not clearly defined, however. 2. No other acute abnormality within abdomen or pelvis. 3. Small amount reticulonodular opacities posterior lower  lobes, greater on the left, which may reflect atelectasis. Infection and lymphangitic spread of carcinoma both in the differential diagnosis. 4. Mild aortic atherosclerosis. Electronically Signed   By: Lajean Manes M.D.   On: 06/16/2020 14:18   MR SHOULDER RIGHT W WO CONTRAST  Result Date: 05/31/2020 CLINICAL DATA:  Right shoulder pain.  Metastatic esophageal cancer. EXAM: MRI OF THE RIGHT SHOULDER WITHOUT AND WITH CONTRAST TECHNIQUE: Multiplanar, multisequence MR imaging of the right shoulder was performed before and after the administration of intravenous contrast. CONTRAST:  75mL GADAVIST GADOBUTROL 1 MMOL/ML IV SOLN COMPARISON:  PET-CT 03/23/2020 and MRI cervical spine 05/31/2020 FINDINGS: Rotator cuff: Moderate rotator cuff tendinopathy/tendinosis. No full-thickness retracted rotator cuff tear. Muscles: Moderate edema like signal changes involving the subscapularis muscle. Nonspecific myositis or possible muscle tear. Denervation edema is also possibility given the large right supraclavicular mass and possible brachial plexus involvement. Biceps long head:  Intact Acromioclavicular Joint: Mild degenerative changes. Type 2 acromion. No lateral downsloping or undersurface spurring. Glenohumeral Joint: Mild degenerative chondrosis. No joint effusion or synovitis. Labrum:  No definite labral tears. Bones: No worrisome bone lesions or areas of abnormal bone enhancement to suggest metastatic disease. Other: No subacromial/subdeltoid fluid collections to suggest bursitis. IMPRESSION: 1. Moderate rotator cuff tendinopathy/tendinosis. No full-thickness retracted rotator cuff tear. 2. Moderate edema like signal changes involving the subscapularis muscle. Nonspecific myositis or possible muscle tear. Denervation edema is also possibility given the large right supraclavicular mass and possible medial brachial plexus involvement. 3. Intact long head biceps tendon and glenoid labrum. 4. No significant findings to suggest  bony metastatic disease. Electronically Signed   By: Marijo Sanes M.D.   On: 05/31/2020 16:50    PERFORMANCE STATUS (ECOG) : 4 - Bedbound  Review of Systems Unable to provide  Physical Exam General: Frail appearing, thin Cardiovascular: Tachycardic Pulmonary: clear ant fields Abdomen: PEG noted GU: no suprapubic tenderness Extremities: no edema, no joint deformities Skin: no rashes Neurological: Weakness but otherwise nonfocal  IMPRESSION: Patient well-known to me from the clinic.  He was seen in the ED.  Patient repeatedly stated "I can't talk."  He listened and seemed to understand as I explained his current acute  medical problems.  At the time of my conversation, the CT abdomen/pelvis had yet to result but I explained the possibility that we could be faced with a situation with limited treatment options and one in which hospice would be reasonable.  At baseline, patient has been living with niece and nephew, who has had some difficulty obtaining his care at home.  Returning home is not a practical option and placement at either SNF or hospice would be reasonable.  Symptomatically, patient endorses pain in back and chest.  He has received morphine but he indicates that that has not been effective so far.  I will increase the frequency and dose for his comfort.  Patient is a DNR.  He has no healthcare power of attorney.  He is unmarried with no children.  He has multiple siblings and a niece and nephew who are involved.  Dr. Janese Banks and I called and spoke with patient's sister, Curly [place]ores, and niece, Ellison Hughs.  Family were updated on the findings of the work-up to date.  Surgery for possible perforated viscus would likely be deemed too high risk and patient with end-stage cancer.  The option of comfort care was discussed at length.  Family would like to talk amongst themselves and will let us know how they wish to proceed.  PLAN: -Recommend best supportive care -DNR/DNI -Liberalize morphine  for comfort -Consult hospice liaison  Case and plan discussed with Drs. Agbata and Janese Banks   Addendum:  I spoke again with patient's sister and niece. They say they have talked with the rest of patient's siblings and that all family are in support of not pursuing surgery and instead keeping patient comfortable. Sister asked about option of hospice, which we discussed in detail. Will consult hospice liaison to coordinate.   Time Total: 60 minutes  Visit consisted of counseling and education dealing with the complex and emotionally intense issues of symptom management and palliative care in the setting of serious and potentially life-threatening illness.Greater than 50%  of this time was spent counseling and coordinating care related to the above assessment and plan.  Signed by: Altha Harm, PhD, NP-C

## 2020-06-16 NOTE — Telephone Encounter (Signed)
Call to answering service from [place] reporting that patient has fallen multiple times, is in the hospital and needs placement

## 2020-06-16 NOTE — ED Triage Notes (Signed)
Pt here for fall today states has had generalized weakness. Pt has hx of throat cancer is currently getting chemo for the same.

## 2020-06-17 DIAGNOSIS — R198 Other specified symptoms and signs involving the digestive system and abdomen: Secondary | ICD-10-CM

## 2020-06-17 LAB — CBC
HCT: 33.2 % — ABNORMAL LOW (ref 39.0–52.0)
Hemoglobin: 10.7 g/dL — ABNORMAL LOW (ref 13.0–17.0)
MCH: 30 pg (ref 26.0–34.0)
MCHC: 32.2 g/dL (ref 30.0–36.0)
MCV: 93 fL (ref 80.0–100.0)
Platelets: 113 10*3/uL — ABNORMAL LOW (ref 150–400)
RBC: 3.57 MIL/uL — ABNORMAL LOW (ref 4.22–5.81)
RDW: 18.9 % — ABNORMAL HIGH (ref 11.5–15.5)
WBC: 4.3 10*3/uL (ref 4.0–10.5)
nRBC: 0 % (ref 0.0–0.2)

## 2020-06-17 LAB — BASIC METABOLIC PANEL
Anion gap: 6 (ref 5–15)
BUN: 21 mg/dL (ref 8–23)
CO2: 24 mmol/L (ref 22–32)
Calcium: 9.5 mg/dL (ref 8.9–10.3)
Chloride: 116 mmol/L — ABNORMAL HIGH (ref 98–111)
Creatinine, Ser: 0.5 mg/dL — ABNORMAL LOW (ref 0.61–1.24)
GFR, Estimated: >60 mL/min (ref >60)
Glucose, Bld: 105 mg/dL — ABNORMAL HIGH (ref 70–99)
Potassium: 3.8 mmol/L (ref 3.5–5.1)
Sodium: 146 mmol/L — ABNORMAL HIGH (ref 135–145)

## 2020-06-17 NOTE — Hospital Course (Addendum)
1/20 -> perforated viscus in a patient with stage IV metastatic esophageal carcinoma.  Palliative care discussed with family who chose comfort care. 1/21 -> family is requesting hospice home placement but difficult placement due to COVID.  TOC working on with hospice team 1/22 -> comfort care for now.  [facility]patient but they do not have beds available 1/23 -> still waiting for residential hospice bed.  Family at bedside.

## 2020-06-17 NOTE — Progress Notes (Addendum)
[place] at [place] NAME: Andrew Pratt    MR#:  [id number]  DATE OF BIRTH:  1956-03-11  SUBJECTIVE:  CHIEF COMPLAINT:   Chief Complaint  Patient presents with   Fall   Sore Throat  Reports minimal pain in his back and chest and morphine seem to help.  Denies any other symptoms at this time REVIEW OF SYSTEMS:  ROS unable to obtain due to his mental status DRUG ALLERGIES:  No Known Allergies VITALS:  Blood pressure 113/76, pulse (!) 109, temperature 100.1 F (37.8 C), temperature source Oral, resp. rate 20, weight 40 kg, SpO2 98 %. PHYSICAL EXAMINATION:  Physical Exam  General: Cachectic, chronically ill looking and frail appearing.  Looking older than stated age Cardiovascular tachycardic Pulmonary decreased breath sound the bases Abdomen PEG tube in place Skin no rash Neuro nonfocal exam.  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 Failure to thrive. Generalized weakness. Patient also fell. Dizziness. EXAM: CT ABDOMEN AND PELVIS WITH CONTRAST TECHNIQUE: Multidetector CT imaging of the abdomen and pelvis was performed using the standard protocol following bolus administration of intravenous contrast. CONTRAST:  43mL OMNIPAQUE IOHEXOL 300  MG/ML  SOLN COMPARISON:  03/06/2020 FINDINGS: Lower chest: Small areas of reticulonodular opacity in the dependent lower lobes, greater on the left. Remainder of the lung bases are clear. No pleural effusion. Hepatobiliary: No focal liver abnormality is seen. No gallstones, gallbladder wall thickening, or biliary dilatation. Pancreas: Dilated common bile duct to 3-4 mm, increased from the prior study. No pancreatic mass or inflammation. Spleen: Normal in size without focal abnormality. Adrenals/Urinary Tract: Adrenal glands are unremarkable. Kidneys are normal, without renal calculi, focal lesion, or hydronephrosis. Bladder is unremarkable. Stomach/Bowel: There is pneumatosis intestinalis along the cecum and ascending colon as well as evidence a bowel perforation with a small amount of free intraperitoneal air. Free air collects anterior to the liver, with multiple additional bubbles of free air that are most evident in the right side of the abdomen adjacent to the abnormal right colon. Remainder of the colon is unremarkable. Normal small bowel. Normal appendix visualized. Gastrostomy tube in the mid stomach appears well positioned. Stomach otherwise unremarkable. Vascular/Lymphatic: Mild aortic atherosclerosis. No aneurysm. No enlarged lymph nodes. Reproductive: Prominent but stable appearing prostate gland. Other: No abdominal wall hernia or abnormality. No abdominopelvic ascites. Musculoskeletal: No fracture or acute finding.  No bone lesion. IMPRESSION: 1. Small amount of free intraperitoneal air that appears to arisen from the right colon. Cecum and ascending colon demonstrates pneumatosis intestinalis. Exact site bowel perforation is not clearly defined, however. 2. No other acute abnormality within abdomen or pelvis. 3. Small amount reticulonodular opacities posterior lower lobes, greater on the left,  which may reflect atelectasis. Infection and lymphangitic spread of carcinoma both in the differential  diagnosis. 4. Mild aortic atherosclerosis. Electronically Signed   By: Lajean Manes M.D.   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  While in the ED, CT of the abdomen revealed pneumoperitoneum with possible perforation of the right colon, possible aspiration pneumonia and marked worsening nodal disease in the right thorax.  Possible perforated viscus -patient/family opted for no surgery and requesting comfort care/hospice Metastatic esophageal cancer Hypernatremia Fall at home Generalized weakness Adult failure to thrive Severe protein calorie malnutrition Body mass index is 16.66 kg/m. Dehydration Hypercalcemia Nicotine dependence Sinus tachycardia COVID 19 infection -positive on 1/20    1/20 -> perforated viscus in a patient with stage IV metastatic esophageal carcinoma.  Palliative care discussed with family who chose comfort care. 1/21 -> family is requesting hospice home placement but difficult placement due to COVID.  TOC working on with hospice team    Net IO Since Admission: 1,257.91 mL [06/17/20 1330]      Level of care: Med-Surg  Status is: Inpatient  Remains inpatient appropriate because:Unsafe d/c plan   Dispo: The patient is from: Home              Anticipated d/c is to: Hospice home              Anticipated d/c date is: 2 days              Patient currently is not medically stable to d/c.  Patient is comfort care only.  Waiting for hospice home placement.  Considering his COVID positive it is difficult to find residential hospice at this time.  TOC team is working on it.    DVT prophylaxis:        Comfort care only    Family Communication: None.  TOC team and hospice liaison are working with family for hospice on placement   All the records are reviewed and  case discussed with Care Management/Social Worker. Management plans discussed with the patient, nursing and they are in agreement.  CODE STATUS: DNR/comfort care   TOTAL TIME TAKING CARE OF THIS PATIENT: 35 minutes.   More than 50% of the time was spent in counseling/coordination of care: YES  POSSIBLE D/C IN 2-3 DAYS, DEPENDING ON CLINICAL CONDITION.   Max Sane M.D on 06/17/2020 at 1:30 PM  [facility]   CC: Primary care physician; Sindy Guadeloupe, MD  Note: This dictation was prepared with Dragon dictation along with smaller phrase technology. Any transcriptional errors that result from this process are unintentional.

## 2020-06-17 NOTE — TOC Initial Note (Signed)
Transition of Care [facility]) - Initial/Assessment Note    Patient Details  Name: Andrew Pratt MRN: [id number] Date of Birth: February 28, 1956  Transition of Care [facility]) CM/SW Contact:    Shelbie Hutching, RN Phone Number: 06/17/2020, 2:17 PM  Clinical Narrative:                 Patient brought in to the hospital after falling at home.  Patient lives with his nephew, [name].  Palliative care has met with patient and talked with family.  Decision was made for comfort care and hospice placement.  RNCM confirmed with Juanita the sister and [name] the nephew today that hospice facility is the goal, they do not feel they can care for him at home adequately.  Patient is COVID + and will need a facility that accepts COVID patient's.  [facility] [place] will accept COVID patients but they do not have any open beds at this time.  Referral faxed to [facility] at [phone].  They will reach out to weekend staff if bed becomes available.    Expected Discharge Plan: [place] Barriers to Discharge: Hospice Bed not available   Patient Goals and CMS Choice Patient states their goals for this hospitalization and ongoing recovery are:: Family would like for patient to go to hospice medical facility CMS Medicare.gov Compare Post Acute Care list provided to:: Patient Represenative (must comment) Choice offered to / list presented to : Sibling (family)  Expected Discharge Plan and Services Expected Discharge Plan: [place] In-house Referral: Hospice / Palliative Care Discharge Planning Services: CM Consult Post Acute Care Choice: [place] arrangements for the past 2 months: [place]                                      Prior Living Arrangements/Services Living arrangements for the past 2 months: [place] Lives with:: Relatives Patient language and need for interpreter reviewed:: Yes        Need for Family Participation in  Patient Care: Yes (Comment) (cancer with mets, bowel perf) Care giver support system in place?: Yes (comment) (family)   Criminal Activity/Legal Involvement Pertinent to Current Situation/Hospitalization: No - Comment as needed  Activities of Daily Living   ADL Screening (condition at time of admission) Patient's cognitive ability adequate to safely complete daily activities?: Yes Is the patient deaf or have difficulty hearing?: No Does the patient have difficulty seeing, even when wearing glasses/contacts?: No Does the patient have difficulty concentrating, remembering, or making decisions?: Yes Patient able to express need for assistance with ADLs?: Yes Does the patient have difficulty dressing or bathing?: Yes Independently performs ADLs?: Yes (appropriate for developmental age) Does the patient have difficulty walking or climbing stairs?: Yes  Permission Sought/Granted Permission sought to share information with : Family Supports,Case Nature conservation officer Permission granted to share information with : Yes, Verbal Permission Granted  Share Information with NAME: Juanita and Exline granted to share info w AGENCY: [facility] [place] granted to share info w Relationship: sister and nephew     Emotional Assessment       Orientation: : Oriented to Self,Oriented to Place,Oriented to  Time,Oriented to Situation Alcohol / Substance Use: Not Applicable Psych Involvement: No (comment)  Admission diagnosis:  Back pain [M54.9] Elevated troponin [R77.8] Generalized weakness [R53.1] Fall in home, initial encounter [W19.Merril Abbe, Q65.784] Fall  at home, initial encounter [W19.XXXA, O37.858] Syncope, unspecified syncope type [R55] Patient Active Problem List   Diagnosis Date Noted   Generalized weakness 06/16/2020   Hypercalcemia 06/16/2020   Hypernatremia 06/16/2020   Fall at home, initial encounter 06/16/2020   Palliative care encounter     Iron deficiency anemia 03/24/2020   Hypophosphatemia    Aspiration pneumonia of both lower lobes ([place])    Weakness    Protein-calorie malnutrition, severe 03/07/2020   Primary cancer of esophagus with metastasis to other site [facility])    Goals of care, counseling/discussion    Normocytic anemia    Esophageal obstruction 03/06/2020   Difficulty swallowing 03/05/2020   Hypokalemia 03/05/2020   Leukocytosis 03/05/2020   Tobacco abuse 03/05/2020   Malignant neoplasm of lower third of esophagus ([place])    Esophageal dysphagia    PCP:  Sindy Guadeloupe, MD Pharmacy:   [facility] [place], [address] AT [address] [place] [address] Phone: [phone] Fax: [place], [place] [place] [address] Phone: [phone] Fax: [phone]     Social Determinants of Health (SDOH) Interventions    Readmission Risk Interventions No flowsheet data found.

## 2020-06-18 MED ORDER — ACETAMINOPHEN 650 MG RE SUPP: 650.0000 mg | Freq: Four times a day (QID) | RECTAL | Status: DC | PRN

## 2020-06-18 MED ORDER — ACETAMINOPHEN 325 MG PO TABS: 650.0000 mg | ORAL_TABLET | Freq: Four times a day (QID) | ORAL | Status: DC | PRN

## 2020-06-18 MED ORDER — GLYCOPYRROLATE 0.2 MG/ML IJ SOLN: 0.2000 mg | INTRAMUSCULAR | Status: DC | PRN

## 2020-06-18 MED ORDER — POLYVINYL ALCOHOL 1.4 % OP SOLN
1.0000 [drp] | Freq: Four times a day (QID) | OPHTHALMIC | Status: DC | PRN
  Filled 2020-06-18: qty 15

## 2020-06-18 MED ORDER — DIPHENHYDRAMINE HCL 50 MG/ML IJ SOLN: 25.0000 mg | INTRAMUSCULAR | Status: DC | PRN

## 2020-06-18 MED ORDER — GLYCOPYRROLATE 1 MG PO TABS
1.0000 mg | ORAL_TABLET | ORAL | Status: DC | PRN
  Filled 2020-06-18: qty 1

## 2020-06-18 MED ORDER — LORAZEPAM 2 MG/ML IJ SOLN: 2.0000 mg | INTRAMUSCULAR | Status: DC | PRN

## 2020-06-18 MED ORDER — HALOPERIDOL LACTATE 5 MG/ML IJ SOLN: 2.5000 mg | INTRAMUSCULAR | Status: DC | PRN

## 2020-06-18 MED ORDER — GLYCOPYRROLATE 0.2 MG/ML IJ SOLN
0.2000 mg | INTRAMUSCULAR | Status: DC | PRN
  Administered 2020-06-18: 18:00:00 0.2 mg via INTRAVENOUS
  Filled 2020-06-18: qty 1

## 2020-06-18 NOTE — Progress Notes (Signed)
   06/18/20 0900  Clinical Encounter Type  Visited With Other (Comment) (Pt declined pastoral care.)   As the chaplain on call I called the nurse who spoke with Mr. Andrew Pratt. He declined pastoral care.   [place], [place]

## 2020-06-18 NOTE — Progress Notes (Signed)
[place] at [place] NAME: Andrew Pratt    MR#:  [id number]  DATE OF BIRTH:  [birth year]  SUBJECTIVE:  CHIEF COMPLAINT:   Chief Complaint  Patient presents with  . Fall  . Sore Throat  Reports some pain in his abdomen and back REVIEW OF SYSTEMS:  Review of Systems  Constitutional: Positive for malaise/fatigue. Negative for diaphoresis, fever and weight loss.  HENT: Negative for ear discharge, ear pain, hearing loss, nosebleeds, sore throat and tinnitus.   Eyes: Negative for blurred vision and pain.  Respiratory: Negative for cough, hemoptysis, shortness of breath and wheezing.   Cardiovascular: Negative for chest pain, palpitations, orthopnea and leg swelling.  Gastrointestinal: Positive for abdominal pain. Negative for blood in stool, constipation, diarrhea, heartburn, nausea and vomiting.  Genitourinary: Negative for dysuria, frequency and urgency.  Musculoskeletal: Positive for back pain. Negative for myalgias.  Skin: Negative for itching and rash.  Neurological: Negative for dizziness, tingling, tremors, focal weakness, seizures, weakness and headaches.  Psychiatric/Behavioral: Negative for depression. The patient is not nervous/anxious.     DRUG ALLERGIES:  No Known Allergies VITALS:  Blood pressure (!) 83/64, pulse (!) 110, temperature 98.3 F (36.8 C), temperature source Oral, resp. rate 16, weight 40 kg, SpO2 98 %. PHYSICAL EXAMINATION:  Physical Exam  General: Cachectic, chronically ill looking and frail appearing.  Looking older than stated age Cardiovascular tachycardic Pulmonary decreased breath sound the bases Abdomen PEG tube in place Skin no rash Neuro nonfocal exam.  Awake Extremities no pedal edema LABORATORY PANEL:  Male CBC Recent Labs  Lab 06/17/20 0428  WBC 4.3  HGB 10.7*  HCT 33.2*  PLT 113*    ------------------------------------------------------------------------------------------------------------------ Chemistries  Recent Labs  Lab 06/16/20 0048 06/17/20 0428  NA 148* 146*  K 4.4 3.8  CL 110 116*  CO2 23 24  GLUCOSE 156* 105*  BUN 35* 21  CREATININE 0.83 0.50*  CALCIUM 11.1* 9.5  AST 50*  --   ALT 32  --   ALKPHOS 161*  --   BILITOT 1.1  --    RADIOLOGY:  No results found. 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  While in the ED, CT of the abdomen revealed pneumoperitoneum with possible perforation of the right colon, possible aspiration pneumonia and marked worsening nodal disease in the right thorax.  Possible perforated viscus -patient/family opted for no surgery and requesting comfort care/hospice Metastatic esophageal cancer Hypernatremia Fall at home Generalized weakness Adult failure to thrive Severe protein calorie malnutrition Body mass index is 16.66 kg/m. Dehydration Hypercalcemia Nicotine dependence Sinus tachycardia COVID 19 infection -positive on 1/20 Hypotension   1/20 -> perforated viscus in a patient with stage IV metastatic esophageal carcinoma.  Palliative care discussed with family who chose comfort care. 1/21 -> family is requesting hospice home placement but difficult placement due to COVID.  TOC working on with hospice team 1/22 -> comfort care for now.  [facility] [place] will accept COVID-patient but they do not have beds available   Net IO Since Admission: 957.91 mL [06/18/20 1220]      Level of care: Med-Surg  Status is: Inpatient  Remains inpatient appropriate because:Unsafe d/c plan   Dispo: The patient is from: Home  Anticipated d/c is to: Hospice home              Anticipated d/c date is: 2 days              Patient  currently is not medically stable to d/c.  Patient is comfort care only.  Waiting for hospice home placement.  Considering his COVID positive it is difficult to find residential hospice at this time.  TOC team is working on it.    DVT prophylaxis:        Comfort care only    Family Communication: None.  TOC team and hospice liaison are working with family for hospice home placement   All the records are reviewed and case discussed with Care Management/Social Worker. Management plans discussed with the patient, nursing and they are in agreement.  CODE STATUS: DNR/comfort care   TOTAL TIME TAKING CARE OF THIS PATIENT: 35 minutes.   More than 50% of the time was spent in counseling/coordination of care: YES  POSSIBLE D/C IN 2-3 DAYS, DEPENDING ON CLINICAL CONDITION.   Max Sane M.D on 06/18/2020 at 12:20 PM  [facility]   CC: Primary care physician; Sindy Guadeloupe, MD  Note: This dictation was prepared with Dragon dictation along with smaller phrase technology. Any transcriptional errors that result from this process are unintentional.

## 2020-06-19 DIAGNOSIS — C159 Malignant neoplasm of esophagus, unspecified: Secondary | ICD-10-CM

## 2020-06-19 NOTE — Progress Notes (Signed)
[place] at [place] NAME: Andrew Pratt    MR#:  [id number]  DATE OF BIRTH:  04-Dec-1955  SUBJECTIVE:  CHIEF COMPLAINT:   Chief Complaint  Patient presents with  . Fall  . Sore Throat  Patient is awake and alert.  Requesting pain medicine as he is not comfortable/in pain.  He is Pointing to his abdomen for pain.  Brother at bedside REVIEW OF SYSTEMS:  Review of Systems  Constitutional: Positive for malaise/fatigue. Negative for diaphoresis, fever and weight loss.  HENT: Negative for ear discharge, ear pain, hearing loss, nosebleeds, sore throat and tinnitus.   Eyes: Negative for blurred vision and pain.  Respiratory: Negative for cough, hemoptysis, shortness of breath and wheezing.   Cardiovascular: Negative for chest pain, palpitations, orthopnea and leg swelling.  Gastrointestinal: Positive for abdominal pain. Negative for blood in stool, constipation, diarrhea, heartburn, nausea and vomiting.  Genitourinary: Negative for dysuria, frequency and urgency.  Musculoskeletal: Positive for back pain. Negative for myalgias.  Skin: Negative for itching and rash.  Neurological: Negative for dizziness, tingling, tremors, focal weakness, seizures, weakness and headaches.  Psychiatric/Behavioral: Negative for depression. The patient is not nervous/anxious.     DRUG ALLERGIES:  No Known Allergies VITALS:  Blood pressure (!) 88/73, pulse (!) 102, temperature (!) 97.4 F (36.3 C), temperature source Oral, resp. rate 16, weight 40 kg, SpO2 100 %. PHYSICAL EXAMINATION:  Physical Exam  General: Cachectic, chronically ill looking and frail appearing.  Looking older than stated age Cardiovascular tachycardic Pulmonary decreased breath sound the bases Abdomen PEG tube in place Skin no rash Neuro nonfocal exam.  Awake Extremities no pedal edema LABORATORY PANEL:  Male CBC Recent Labs  Lab 06/17/20 0428  WBC 4.3  HGB 10.7*  HCT 33.2*  PLT 113*    ------------------------------------------------------------------------------------------------------------------ Chemistries  Recent Labs  Lab 06/16/20 0048 06/17/20 0428  NA 148* 146*  K 4.4 3.8  CL 110 116*  CO2 23 24  GLUCOSE 156* 105*  BUN 35* 21  CREATININE 0.83 0.50*  CALCIUM 11.1* 9.5  AST 50*  --   ALT 32  --   ALKPHOS 161*  --   BILITOT 1.1  --    RADIOLOGY:  No results found. 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  While in the ED, CT of the abdomen revealed pneumoperitoneum with possible perforation of the right colon, possible aspiration pneumonia and marked worsening nodal disease in the right thorax.  Possible perforated viscus -patient/family opted for comfort care/hospice Metastatic esophageal cancer Hypernatremia Fall at home Generalized weakness Adult failure to thrive Severe protein calorie malnutrition Body mass index is 16.66 kg/m. Dehydration Hypercalcemia Nicotine dependence Sinus tachycardia COVID 19 infection -positive on 1/20 Hypotension   1/20 -> perforated viscus in a patient with stage IV metastatic esophageal carcinoma.  Palliative care discussed with family who chose comfort care. 1/21 -> family is requesting hospice home placement but difficult placement due to COVID.  TOC working on with hospice team 1/22 -> comfort care for now.  [facility] [place] will accept COVID-patient but they do not have beds available 1/23 -> still waiting for residential hospice bed.  Family at bedside.   Net IO Since Admission: 957.91 mL [06/19/20 1224]      Level of care: Med-Surg  Status is: Inpatient  Remains inpatient appropriate because:Unsafe d/c plan   Dispo: The patient is from: Home              Anticipated d/c is to: Hospice home              Anticipated  d/c date is: 2 days              Patient currently is not medically stable to d/c.  Patient is comfort care only.  Waiting for hospice home placement.  Considering his COVID positive it is difficult to find residential hospice at this time.  TOC team is working on it.    DVT prophylaxis:        Comfort care only    Family Communication: None.  TOC team and hospice liaison are working with family for hospice home placement   All the records are reviewed and case discussed with Care Management/Social Worker. Management plans discussed with the patient, nursing and they are in agreement.  CODE STATUS: DNR/comfort care   TOTAL TIME TAKING CARE OF THIS PATIENT: 35 minutes.   More than 50% of the time was spent in counseling/coordination of care: YES  POSSIBLE D/C IN 1-2 DAYS, DEPENDING ON CLINICAL CONDITION.   Max Sane M.D on 06/19/2020 at 12:24 PM  [facility]   CC: Primary care physician; Sindy Guadeloupe, MD  Note: This dictation was prepared with Dragon dictation along with smaller phrase technology. Any transcriptional errors that result from this process are unintentional.

## 2020-06-19 NOTE — TOC Transition Note (Addendum)
Transition of Care [facility]) - CM/SW Discharge Note   Patient Details  Name: Andrew Pratt MRN: [id number] Date of Birth: 1956/05/20  Transition [facility]) CM/SW Contact:  Ova Freshwater Phone Number:  [phone] 06/19/2020, 5:09 PM   Clinical Narrative:     CSW received call from [facility] at [place] w/ bed offer for patient.  Tharon Aquas RN  stated she already updated patient's family.  Tharon Aquas RN stated if patient was not able to accept bed today she could not guarantee he would have it tomorrow morning 06/20/2020. CSW stated I would not be able to give Tharon Aquas RN an ETA because [facility] does not give use ETA.  Frankie RN verbalized understanding.  Patient will d/c to [place] Room# 207, Report# [phone].  Facesheet, Med Production manager by [employer] and placed in folder,  DNR form signature request made to Dr. Manuella Ghazi, and confirmed.   CSW called [facility] and was told they would not be abel to give Eta.  [facility] called a few minutes later to update CSW that they may not be able to transport patient to [place] due to continued winter weather advisories.  CSW told [facility] pt is hospice patient and he may lose the bed if he does not arrive today. [facility] stated she would speak with Crew Chief.  [facility] came back on line and stated Crew Chief would ask [facility] for assistance in transport.  Due to weather advisory [facility] may not be able [place]. CSW stated understanding and called [facility] and was told by Larkin Ina they are not longer transporting patients today. CSW called [facility] back for update and they stated they still did not have a response from [facility].  CSW gave Mayer Masker RN contact number for [facility] to call directly. CSW called and updated Psychologist, occupational about transportation situation.     Barriers to Discharge: Hospice Bed not available   Patient Goals and CMS Choice Patient states their goals  for this hospitalization and ongoing recovery are:: Family would like for patient to go to hospice medical facility CMS Medicare.gov Compare Post Acute Care list provided to:: Patient Represenative (must comment) Choice offered to / list presented to : Sibling (family)  Discharge Placement                       Discharge Plan and Services In-house Referral: Hospice / Palliative Care Discharge Planning Services: CM Consult Post Acute Care Choice: Home Health                               Social Determinants of Health (SDOH) Interventions     Readmission Risk Interventions No flowsheet data found.

## 2020-06-19 NOTE — Progress Notes (Signed)
Report given to [facility] at hospice facility [phone].  Pt to be transferred to room 207.  Awaiting confirmation from transport.

## 2020-06-19 NOTE — Discharge Summary (Signed)
[facility] NAME: Andrew Pratt    MR#:  [id number]  DATE OF BIRTH:  Dec 15, 1955  DATE OF ADMISSION:  06/16/2020   ADMITTING PHYSICIAN: Collier Bullock, MD  DATE OF DISCHARGE: 06/19/2020  PRIMARY CARE PHYSICIAN: Sindy Guadeloupe, MD   ADMISSION DIAGNOSIS:  Back pain [M54.9] Elevated troponin [R77.8] Generalized weakness [R53.1] Fall in home, initial encounter [W19.Merril Abbe, F68.127] Fall at home, initial encounter [id number], Y92.009] Syncope, unspecified syncope type [R55] DISCHARGE DIAGNOSIS:  Principal Problem:   Generalized weakness Active Problems:   Tobacco abuse   Esophageal obstruction   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  While in the ED, CT of the abdomen revealed pneumoperitoneum with possible perforation of the right colon, possible aspiration pneumonia and marked worsening nodal disease in the right thorax.  Possible perforated viscus -patient/family opted for comfort care/hospice Metastatic esophageal cancer Hypernatremia Fall at home Generalized weakness Adult failure to thrive Severe protein calorie malnutrition Body mass index is 16.66 kg/m. Dehydration Hypercalcemia Nicotine dependence Sinus tachycardia COVID 19 infection -positive on 1/20 Hypotension   DISCHARGE CONDITIONS:  fair CONSULTS OBTAINED:   DRUG ALLERGIES:  No Known Allergies DISCHARGE MEDICATIONS:   Allergies as of 06/19/2020   No Known Allergies     Medication List    STOP taking these medications   acetaminophen  325 MG tablet Commonly known as: TYLENOL   azithromycin 250 MG tablet Commonly known as: Zithromax   feeding supplement (OSMOLITE 1.5 CAL) Liqd   fentaNYL 25 MCG/HR Commonly known as: DURAGESIC   free water Soln   lidocaine-prilocaine cream Commonly known as: EMLA   Melatonin 1 MG/ML Liqd   metoCLOPramide 5 MG/5ML solution Commonly known as: REGLAN   nicotine 21 mg/24hr patch Commonly known as: NICODERM CQ - dosed in mg/24 hours   ondansetron 4 MG disintegrating tablet Commonly known as: Zofran ODT   ProAir HFA 108 (90 Base) MCG/ACT inhaler Generic drug: albuterol     TAKE these medications   morphine CONCENTRATE 10 MG/0.5ML Soln concentrated solution Take 0.25-0.5 mLs (5-10 mg total) by mouth every 4 (four) hours as needed for severe pain.      DISCHARGE INSTRUCTIONS:   DIET:  Pleasure feeding DISCHARGE CONDITION:  Critical ACTIVITY:  Activity as tolerated OXYGEN:  Home Oxygen: No.  Oxygen Delivery: room air DISCHARGE LOCATION:  Hospice Home   If you experience worsening of your admission symptoms, develop shortness of breath, life threatening emergency, suicidal or homicidal thoughts you must seek medical attention immediately by calling 911 or calling your MD immediately  if symptoms less severe.  You Must read complete instructions/literature along with all the possible adverse reactions/side effects for all the Medicines you take and that have been prescribed to you. Take any new Medicines after you have completely understood and accpet all the possible adverse reactions/side effects.   Please note  You were cared for by a hospitalist during your hospital stay. If you have any questions about your discharge medications or the care you received while you were in the hospital after you  are discharged, you can call the unit and asked to speak with the hospitalist on call if the hospitalist that took care of you is not available. Once you are discharged, your  primary care physician will handle any further medical issues. Please note that NO REFILLS for any discharge medications will be authorized once you are discharged, as it is imperative that you return to your primary care physician (or establish a relationship with a primary care physician if you do not have one) for your aftercare needs so that they can reassess your need for medications and monitor your lab values.    On the day of Discharge:  VITAL SIGNS:  Blood pressure 95/69, pulse 92, temperature 97.8 F (36.6 C), resp. rate 16, weight 40 kg, SpO2 100 %. PHYSICAL EXAMINATION:  GENERAL:  65 y.o.-year-old patient  General: Cachectic, chronically ill looking and frail appearing.  Looking older than stated age Cardiovascular tachycardic Pulmonary decreased breath sound the bases Abdomen PEG tube in place Skin no rash Neuro nonfocal exam.  Awake Extremities no pedal edema DATA REVIEW:   CBC Recent Labs  Lab 06/17/20 0428  WBC 4.3  HGB 10.7*  HCT 33.2*  PLT 113*    Chemistries  Recent Labs  Lab 06/16/20 0048 06/17/20 0428  NA 148* 146*  K 4.4 3.8  CL 110 116*  CO2 23 24  GLUCOSE 156* 105*  BUN 35* 21  CREATININE 0.83 0.50*  CALCIUM 11.1* 9.5  AST 50*  --   ALT 32  --   ALKPHOS 161*  --   BILITOT 1.1  --      Outpatient follow-up   30 Day Unplanned Readmission Risk Score   Flowsheet Row ED to Hosp-Admission (Current) from 06/16/2020 in [facility] (1C)  30 Day Unplanned Readmission Risk Score (%) 26.49 Filed at 06/19/2020 1600     This score is the patient's risk of an unplanned readmission within 30 days of being discharged (0 -100%). The score is based on dignosis, age, lab data, medications, orders, and past utilization.   Low:  0-14.9   Medium: 15-21.9   High: 22-29.9   Extreme: 30 and above         Management plans discussed with the patient, family and they are in agreement.  CODE STATUS: DNR   TOTAL TIME TAKING  CARE OF THIS PATIENT: 45 minutes.    Max Sane M.D on 06/19/2020 at 5:19 PM  [facility]   CC: Primary care physician; Sindy Guadeloupe, MD   Note: This dictation was prepared with Dragon dictation along with smaller phrase technology. Any transcriptional errors that result from this process are unintentional.

## 2020-06-19 NOTE — Progress Notes (Signed)
Update given to brother Edd Arbour at bedside

## 2020-06-19 NOTE — TOC Progression Note (Signed)
Transition of Care [facility]) - Progression Note    Patient Details  Name: Andrew Pratt MRN: [id number] Date of Birth: Sep 28, 1955  Transition of Care [facility]) CM/SW [place], [place] Phone Number: [phone] 06/19/2020, 11:24 AM  Clinical Narrative:     Patient pending COVID hospice bed placement at [facility].  Expected Discharge Plan: [place] Barriers to Discharge: Hospice Bed not available  Expected Discharge Plan and Services Expected Discharge Plan: [place] In-house Referral: Hospice / Palliative Care Discharge Planning Services: CM Consult Post Acute Care Choice: [place] arrangements for the past 2 months: [place] Determinants of Health (SDOH) Interventions    Readmission Risk Interventions No flowsheet data found.

## 2020-06-19 NOTE — Progress Notes (Signed)
Permission given by Laqueta Linden to share information regarding this patient with his brother Edd Arbour

## 2020-06-20 ENCOUNTER — Ambulatory Visit: Payer: Medicaid Other

## 2020-06-21 ENCOUNTER — Ambulatory Visit: Payer: Medicaid Other

## 2020-06-21 ENCOUNTER — Inpatient Hospital Stay: Payer: Medicaid Other

## 2020-06-21 ENCOUNTER — Inpatient Hospital Stay: Payer: Medicaid Other | Admitting: Oncology

## 2020-06-22 ENCOUNTER — Ambulatory Visit: Payer: Medicaid Other

## 2020-06-23 ENCOUNTER — Encounter: Payer: Medicaid Other | Admitting: Hospice and Palliative Medicine

## 2020-06-23 ENCOUNTER — Ambulatory Visit: Payer: Medicaid Other | Admitting: Radiation Oncology

## 2020-06-23 ENCOUNTER — Ambulatory Visit: Payer: Medicaid Other

## 2020-06-24 ENCOUNTER — Ambulatory Visit: Payer: Medicaid Other

## 2020-06-27 ENCOUNTER — Ambulatory Visit: Payer: Medicaid Other

## 2020-06-28 ENCOUNTER — Ambulatory Visit: Payer: Medicaid Other

## 2020-06-29 ENCOUNTER — Ambulatory Visit: Payer: Medicaid Other

## 2020-06-30 ENCOUNTER — Ambulatory Visit: Payer: Medicaid Other

## 2020-07-01 ENCOUNTER — Ambulatory Visit: Payer: Medicaid Other

## 2020-07-04 ENCOUNTER — Ambulatory Visit: Payer: Medicaid Other

## 2020-07-05 ENCOUNTER — Ambulatory Visit: Payer: Medicaid Other

## 2020-07-22 ENCOUNTER — Ambulatory Visit: Payer: Medicaid Other

## 2020-07-26 DEATH — deceased

## 2021-01-22 IMAGING — CT CT ABD-PELV W/ CM
2 of 5 series · 15 of 46 positions shown, 17 images · IV contrast (APPLIED)
Comparison: 03/06/2020

CLINICAL DATA: History of stage IV squamous cell carcinoma of the
esophagus on palliative chemotherapy and radiation therapy with
metastatic disease to the right shoulder. Failure to thrive.
Generalized weakness. Patient also fell. Dizziness.

EXAM:
CT ABDOMEN AND PELVIS WITH CONTRAST
TECHNIQUE: Multidetector CT imaging of the abdomen and pelvis was performed
using the standard protocol following bolus administration of
intravenous contrast.
CONTRAST:  60mL OMNIPAQUE IOHEXOL 300 MG/ML  SOLN

[Series 2: routine abd/pel with · axial · 0.58mm/px · z∈[-1222,-877]mm · 12 of 79 slices shown, 14 images]
[im 5/79  soft-tissue]
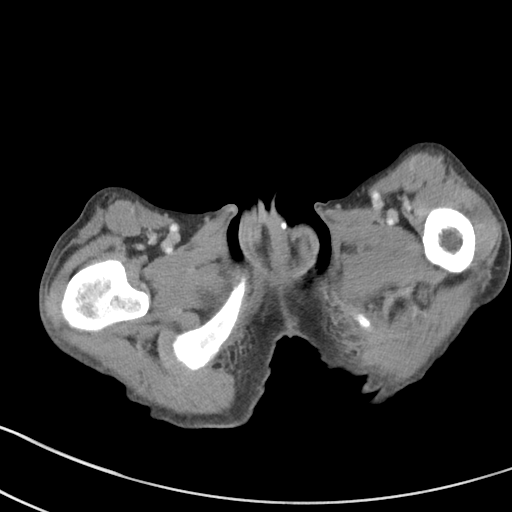
[im 5/79  bone]
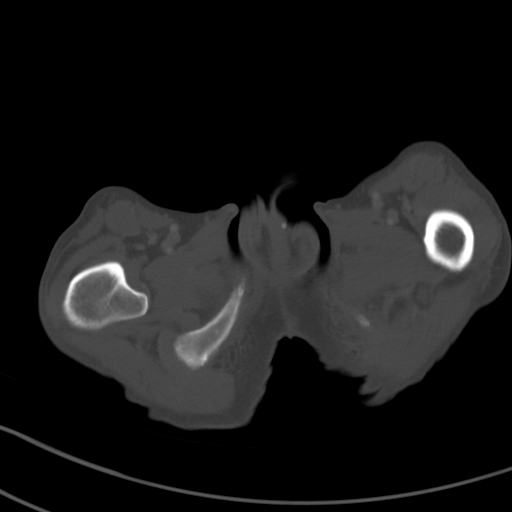
[im 14/79  soft-tissue]
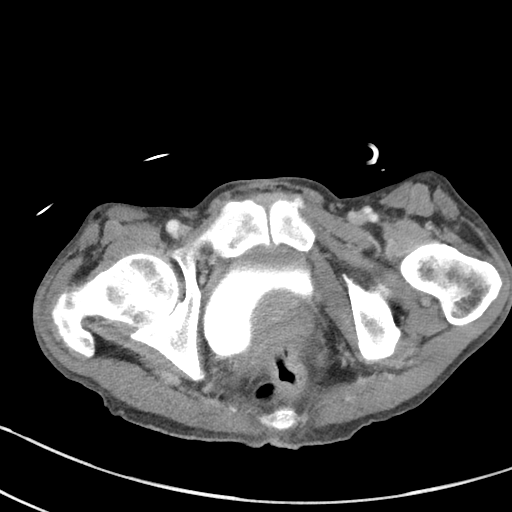
[im 18/79  soft-tissue]
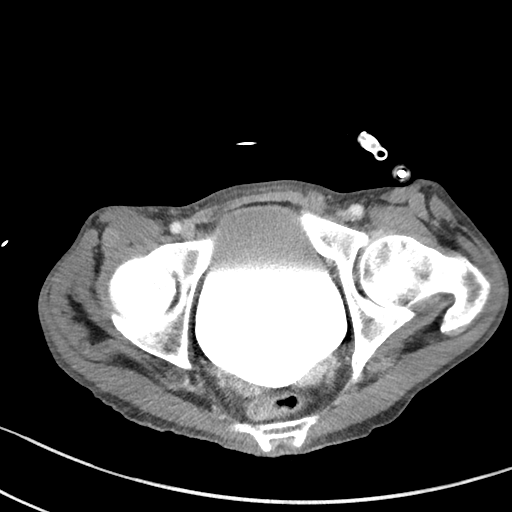
[im 22/79  soft-tissue]
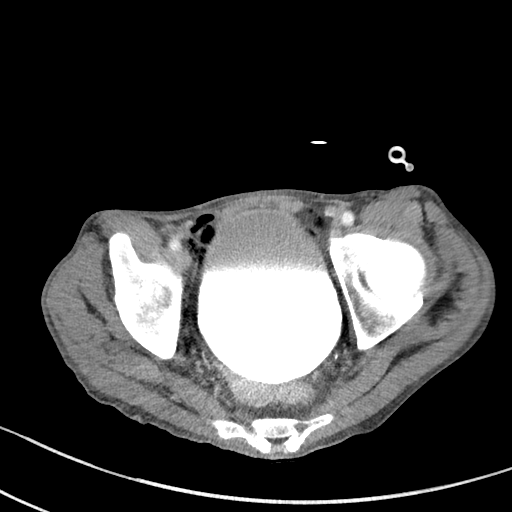
[im 31/79  soft-tissue]
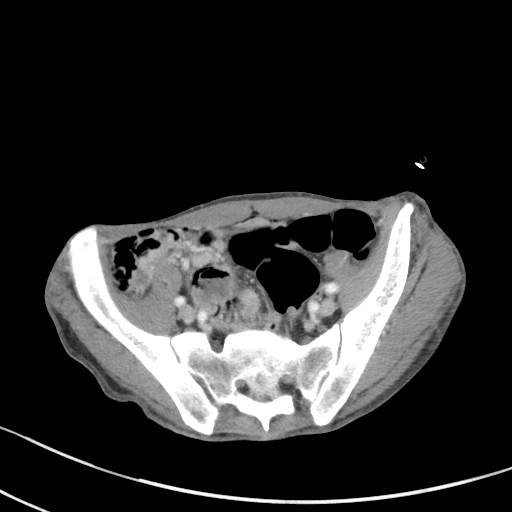
[im 35/79  soft-tissue]
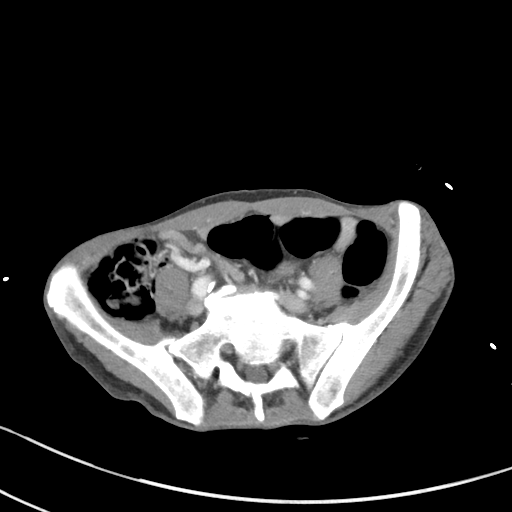
[im 44/79  soft-tissue]
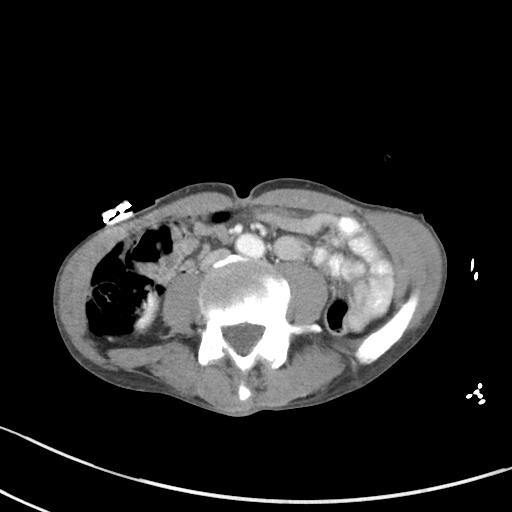
[im 48/79  soft-tissue]
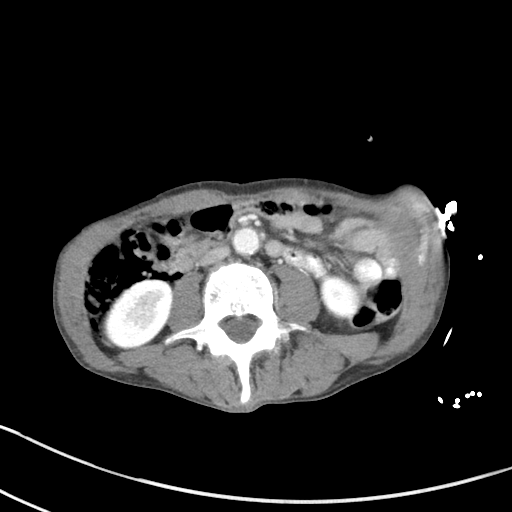
[im 57/79  soft-tissue]
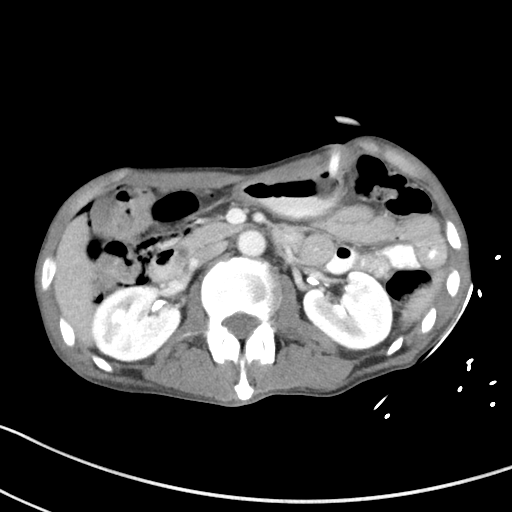
[im 57/79  bone]
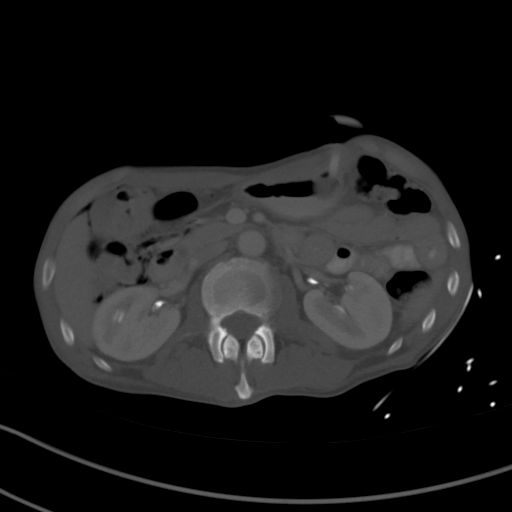
[im 61/79  soft-tissue]
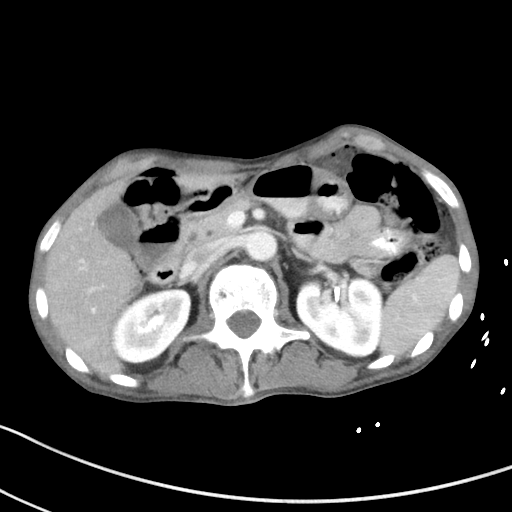
[im 66/79  soft-tissue]
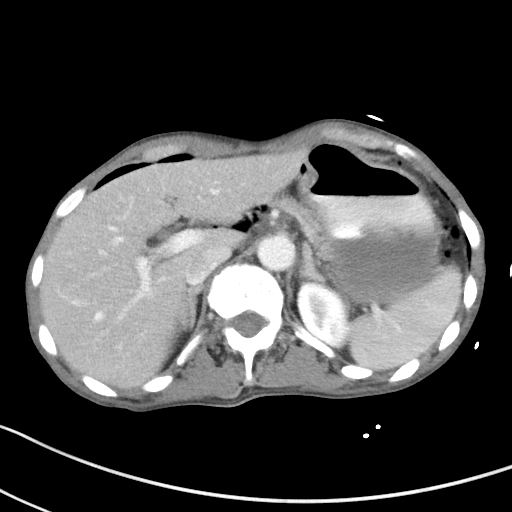
[im 74/79  soft-tissue]
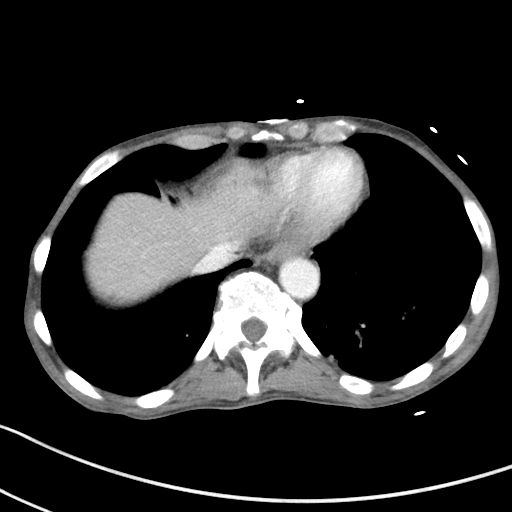

[Series 5: coronal st · coronal · 0.63mm/px · 3 of 67 slices shown]
[im 23/67  soft-tissue]
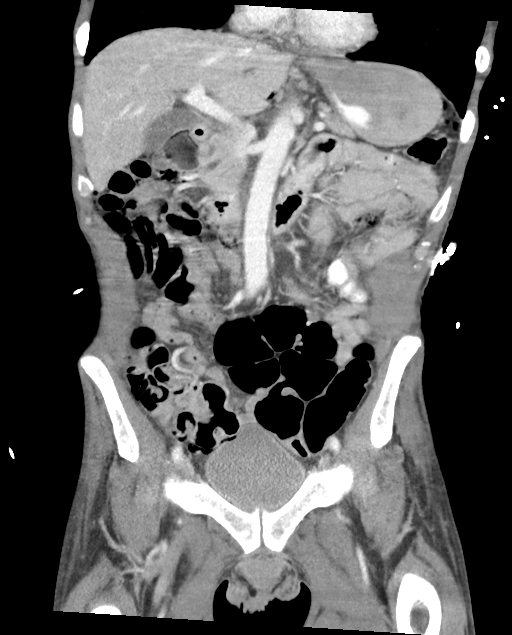
[im 30/67  soft-tissue]
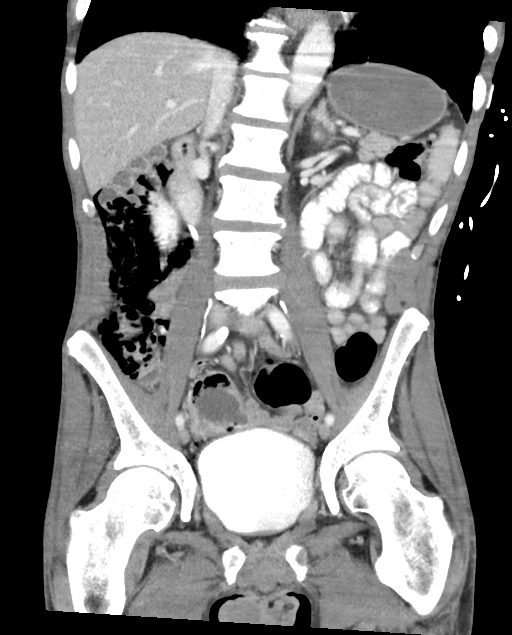
[im 37/67  soft-tissue]
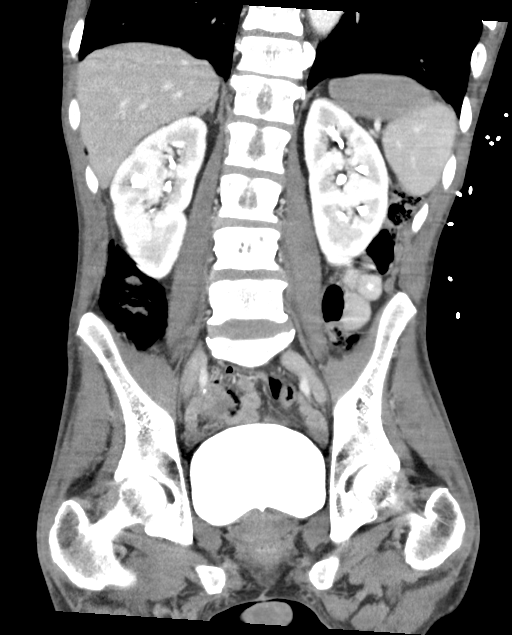

[15 of 46 positions shown; findings below may reference images not displayed]

FINDINGS: Lower chest: Small areas of reticulonodular opacity in the dependent
lower lobes, greater on the left. Remainder of the lung bases are
clear. No pleural effusion.

Hepatobiliary: No focal liver abnormality is seen. No gallstones,
gallbladder wall thickening, or biliary dilatation.

Pancreas: Dilated common bile duct to 3-4 mm, increased from the
prior study. No pancreatic mass or inflammation.

Spleen: Normal in size without focal abnormality.

Adrenals/Urinary Tract: Adrenal glands are unremarkable. Kidneys are
normal, without renal calculi, focal lesion, or hydronephrosis.
Bladder is unremarkable.

Stomach/Bowel: There is pneumatosis intestinalis along the cecum and
ascending colon as well as evidence a bowel perforation with a small
amount of free intraperitoneal air. Free air collects anterior to
the liver, with multiple additional bubbles of free air that are
most evident in the right side of the abdomen adjacent to the
abnormal right colon.

Remainder of the colon is unremarkable. Normal small bowel. Normal
appendix visualized. Gastrostomy tube in the mid stomach appears
well positioned. Stomach otherwise unremarkable.

Vascular/Lymphatic: Mild aortic atherosclerosis. No aneurysm. No
enlarged lymph nodes.

Reproductive: Prominent but stable appearing prostate gland.

Other: No abdominal wall hernia or abnormality. No abdominopelvic
ascites.

Musculoskeletal: No fracture or acute finding.  No bone lesion.
IMPRESSION: 1. Small amount of free intraperitoneal air that appears to arisen
from the right colon. Cecum and ascending colon demonstrates
pneumatosis intestinalis. Exact site bowel perforation is not
clearly defined, however.
2. No other acute abnormality within abdomen or pelvis.
3. Small amount reticulonodular opacities posterior lower lobes,
greater on the left, which may reflect atelectasis. Infection and
lymphangitic spread of carcinoma both in the differential diagnosis.
4. Mild aortic atherosclerosis.

## 2021-01-22 IMAGING — CT CT ANGIO CHEST
2 of 6 series · 17 of 46 positions shown · IV contrast (APPLIED)
Comparison: CT of the chest from 03/06/2020, PET exam from [facility]

CLINICAL DATA: Suspected pulmonary embolism, status post fall in
this 64-year-old male with history of esophageal cancer

EXAM:
CT ANGIOGRAPHY CHEST WITH CONTRAST
TECHNIQUE: Multidetector CT imaging of the chest was performed using the
standard protocol during bolus administration of intravenous
contrast. Multiplanar CT image reconstructions and MIPs were
obtained to evaluate the vascular anatomy.
CONTRAST:  75mL OMNIPAQUE IOHEXOL 350 MG/ML SOLN

[Series 5: thins · axial · 0.60mm/px · z∈[-625,-346]mm · 14 of 307 slices shown]
[im 14/307  lung]
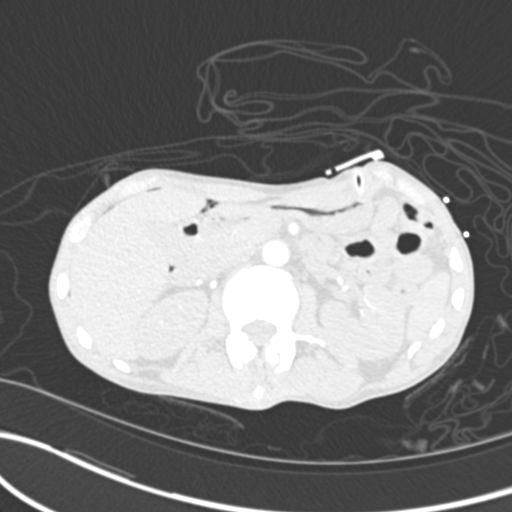
[im 40/307  soft-tissue]
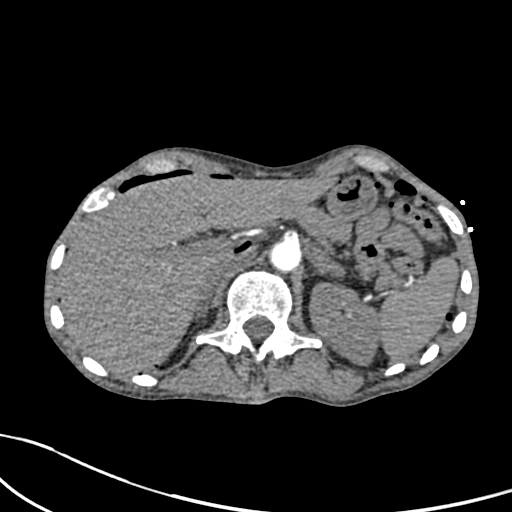
[im 54/307  lung]
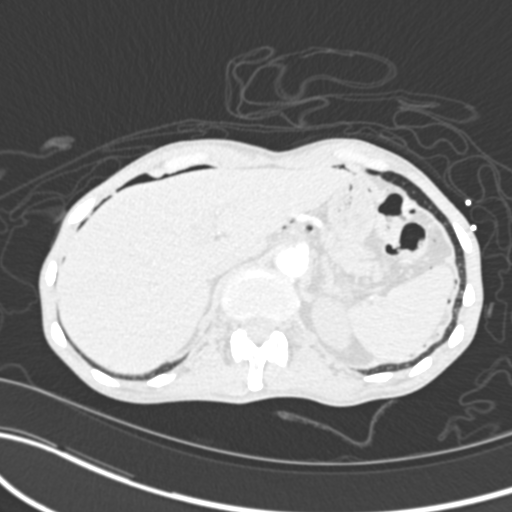
[im 80/307  soft-tissue]
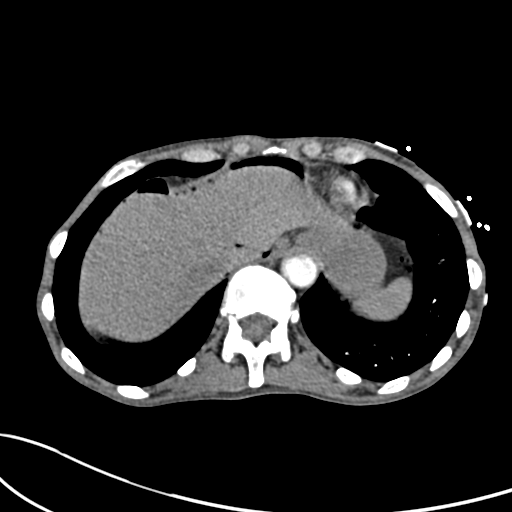
[im 107/307  lung]
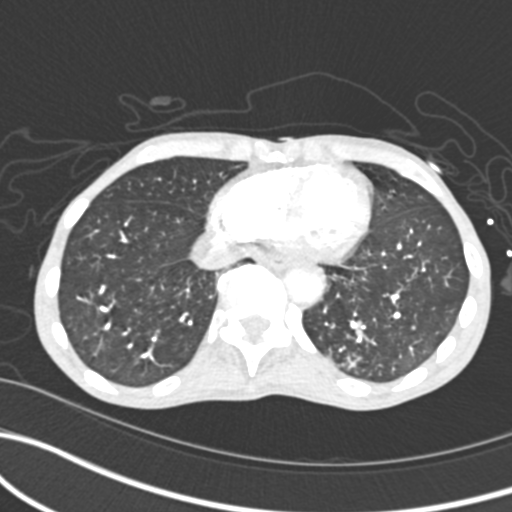
[im 120/307  soft-tissue]
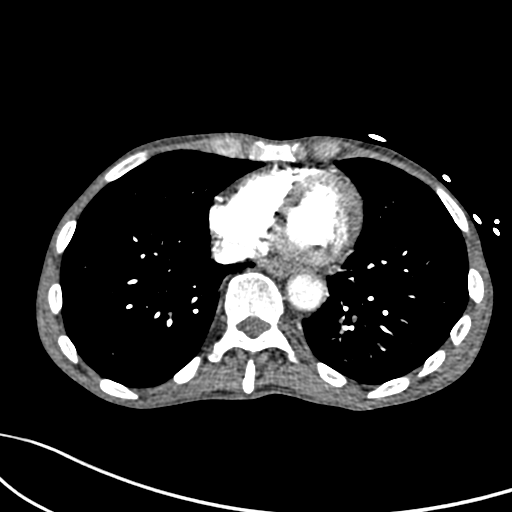
[im 147/307  lung]
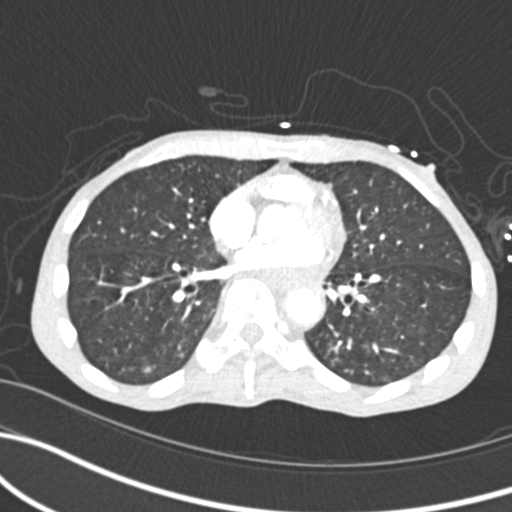
[im 160/307  soft-tissue]
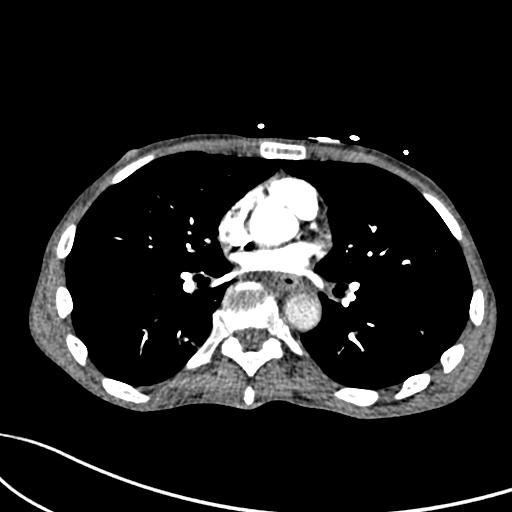
[im 187/307  lung]
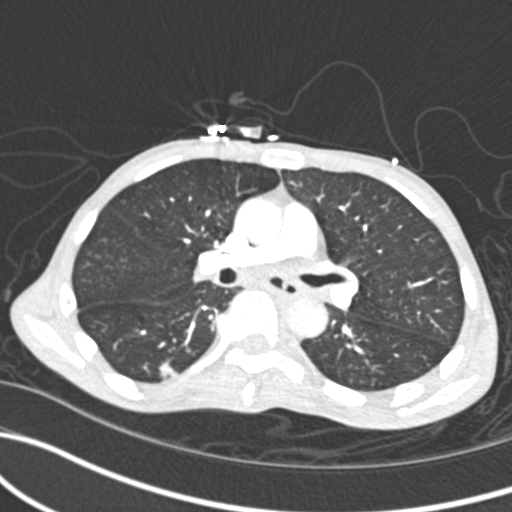
[im 200/307  soft-tissue]
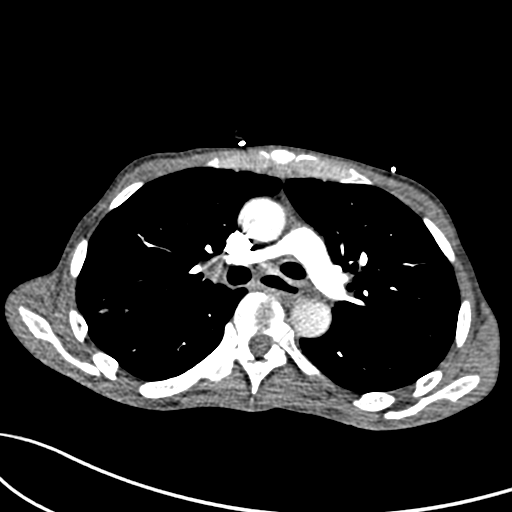
[im 227/307  lung]
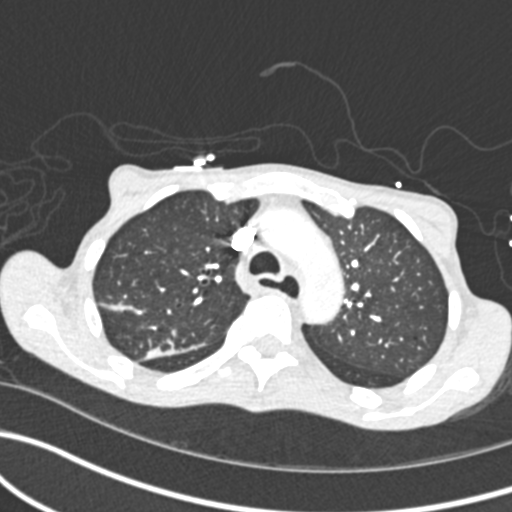
[im 253/307  soft-tissue]
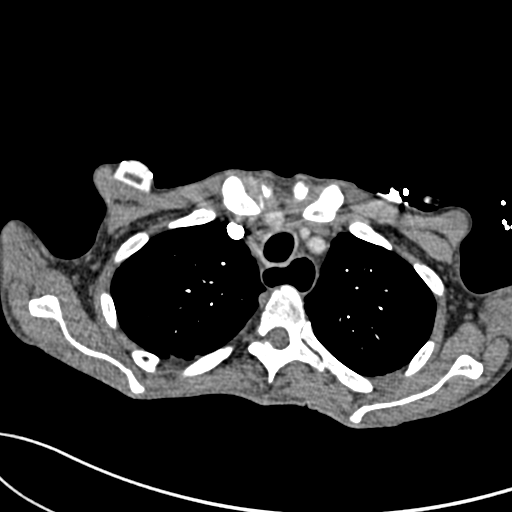
[im 267/307  lung]
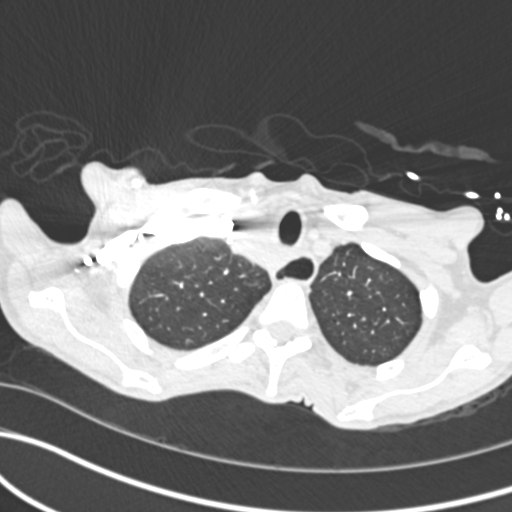
[im 293/307  soft-tissue]
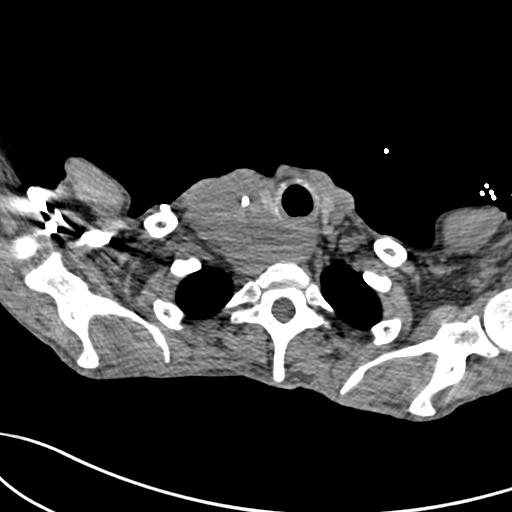

[Series 7: coronal mpr · coronal · 0.57mm/px · 3 of 59 slices shown]
[im 15/59  soft-tissue]
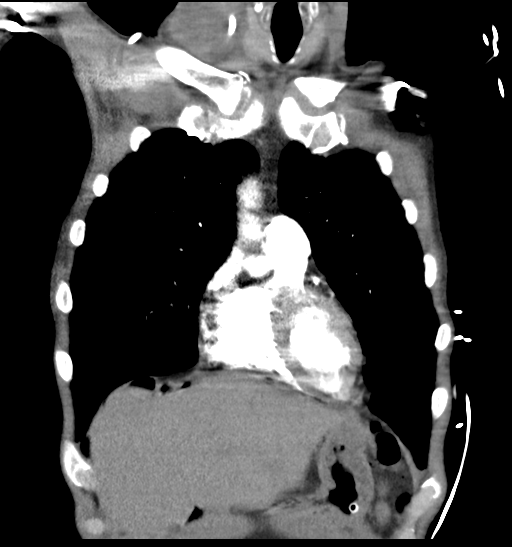
[im 30/59  soft-tissue]
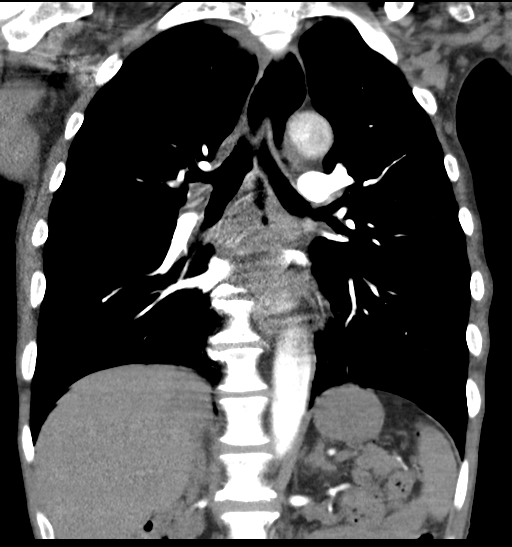
[im 44/59  soft-tissue]
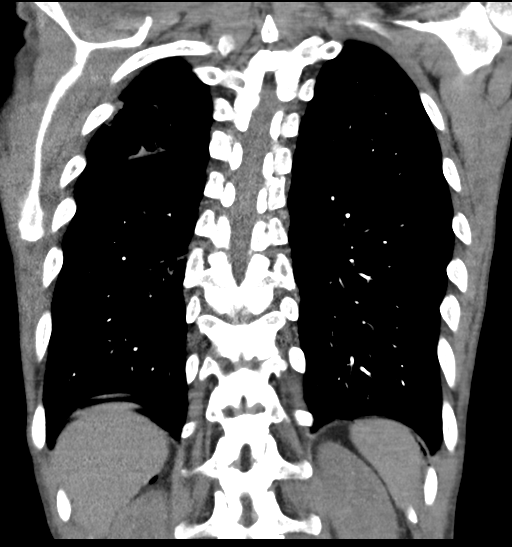

[17 of 46 positions shown; findings below may reference images not displayed]

FINDINGS: Cardiovascular: No sign of pulmonary embolism. Aorta is of normal
caliber, not well evaluated due to phase of contrast enhancement.

RIGHT-sided Port-A-Cath in place. Displaced anteriorly now due to
enlarging thoracic inlet adenopathy. Catheter terminates in the
distal superior vena cava near the caval to atrial junction.
Position difficult to determine due to dense contrast in the SVC.
Heart size is normal without pericardial effusion

Mediastinum/Nodes: Enlarging RIGHT thoracic inlet lymphadenopathy
displaces the catheter in the RIGHT neck anteriorly. Masslike area
measuring approximately 8.5 x 5.6 cm with areas of nodal disease now
confluent. To approximately 2.5 cm lymph nodes were noted in this
location on the prior study. Esophagus markedly patulous. Signs of
distal esophageal thickening with similar appearance. Lymph nodes
adjacent to the EG junction are similar to the prior study. No
axillary lymphadenopathy. RIGHT hilar nodal enlargement though mild
is suspicious given other findings at 11 mm (image 38, series 4).

Lungs/Pleura: Areas of RIGHT upper lobe airspace disease along the
major fissure.

Patchy nodular airspace disease and tree-in-bud opacities in the
lung bases with some areas of filling defect in the bronchi
subtending these areas.

Upper Abdomen:

Moderately large volume of pneumoperitoneum in the upper abdomen.
G-tube in place. By report placed in Wednesday February, 2020.

Incomplete mixing of contrast in the abdominal aorta with suspected
streaming artifact in the SMA anteriorly extending from areas of
unopacified blood in the anterior aorta.

Musculoskeletal: No acute musculoskeletal finding.

Review of the MIP images confirms the above findings.
IMPRESSION: 1. No evidence of pulmonary embolism.
2. Moderately large volume of pneumoperitoneum in the absence of
recent abdominal intervention is suspicious for hollow viscus
perforation. Suggest CT of the abdomen and pelvis with intravenous
and oral contrast for further assessment.
3. What is almost certainly streaming artifact extending into the
SMA from unopacified blood in the aorta, attention to this area on
upcoming CT of the abdomen to exclude the possibility of thrombus in
this location given unexplained pneumoperitoneum.
4. Signs of suspected aspiration with nodular changes at the lung
bases. Suggest follow-up imaging for further assessment to ensure
resolution.
5. Marked worsening of nodal disease at the RIGHT thoracic inlet in
particular.
6. Aortic atherosclerosis.

Critical Value/emergent results were called by telephone at the time
of interpretation on 06/16/2020 at [DATE] to provider SUKH FULTZ
, who verbally acknowledged these results.

Aortic Atherosclerosis ([id number]).
